# Patient Record
Sex: Male | Born: 1945 | Race: White | Hispanic: No | Marital: Married | Smoking: Former smoker
Health system: Southern US, Community
[De-identification: ages and names within clinical notes are randomized; demographics above are authoritative.]

## PROBLEM LIST (undated history)

## (undated) DIAGNOSIS — D638 Anemia in other chronic diseases classified elsewhere: Secondary | ICD-10-CM

## (undated) DIAGNOSIS — L4050 Arthropathic psoriasis, unspecified: Secondary | ICD-10-CM

## (undated) DIAGNOSIS — J449 Chronic obstructive pulmonary disease, unspecified: Secondary | ICD-10-CM

## (undated) DIAGNOSIS — A4101 Sepsis due to Methicillin susceptible Staphylococcus aureus: Secondary | ICD-10-CM

## (undated) DIAGNOSIS — L97509 Non-pressure chronic ulcer of other part of unspecified foot with unspecified severity: Secondary | ICD-10-CM

## (undated) DIAGNOSIS — E11621 Type 2 diabetes mellitus with foot ulcer: Secondary | ICD-10-CM

## (undated) DIAGNOSIS — E039 Hypothyroidism, unspecified: Secondary | ICD-10-CM

## (undated) DIAGNOSIS — I10 Essential (primary) hypertension: Secondary | ICD-10-CM

## (undated) DIAGNOSIS — D649 Anemia, unspecified: Secondary | ICD-10-CM

## (undated) DIAGNOSIS — J4489 Other specified chronic obstructive pulmonary disease: Secondary | ICD-10-CM

## (undated) DIAGNOSIS — G4730 Sleep apnea, unspecified: Secondary | ICD-10-CM

## (undated) DIAGNOSIS — B459 Cryptococcosis, unspecified: Secondary | ICD-10-CM

## (undated) DIAGNOSIS — M1990 Unspecified osteoarthritis, unspecified site: Secondary | ICD-10-CM

## (undated) DIAGNOSIS — A4902 Methicillin resistant Staphylococcus aureus infection, unspecified site: Secondary | ICD-10-CM

## (undated) DIAGNOSIS — N200 Calculus of kidney: Secondary | ICD-10-CM

## (undated) DIAGNOSIS — B029 Zoster without complications: Secondary | ICD-10-CM

## (undated) DIAGNOSIS — M545 Low back pain, unspecified: Secondary | ICD-10-CM

## (undated) DIAGNOSIS — E119 Type 2 diabetes mellitus without complications: Secondary | ICD-10-CM

## (undated) DIAGNOSIS — T879 Unspecified complications of amputation stump: Principal | ICD-10-CM

## (undated) DIAGNOSIS — K219 Gastro-esophageal reflux disease without esophagitis: Secondary | ICD-10-CM

## (undated) HISTORY — DX: Anemia, unspecified: D64.9

## (undated) HISTORY — DX: Unspecified osteoarthritis, unspecified site: M19.90

## (undated) HISTORY — DX: Hypothyroidism, unspecified: E03.9

## (undated) HISTORY — PX: TOE AMPUTATION: SHX809

## (undated) HISTORY — DX: Essential (primary) hypertension: I10

## (undated) HISTORY — DX: Non-pressure chronic ulcer of other part of unspecified foot with unspecified severity: L97.509

## (undated) HISTORY — DX: Arthropathic psoriasis, unspecified: L40.50

## (undated) HISTORY — DX: Anemia in other chronic diseases classified elsewhere: D63.8

## (undated) HISTORY — DX: Chronic obstructive pulmonary disease, unspecified: J44.9

## (undated) HISTORY — PX: BACK SURGERY: SHX140

## (undated) HISTORY — DX: Sepsis due to methicillin susceptible Staphylococcus aureus: A41.01

## (undated) HISTORY — DX: Cryptococcosis, unspecified: B45.9

## (undated) HISTORY — PX: TOTAL HIP ARTHROPLASTY: SHX124

## (undated) HISTORY — PX: THORACIC SPINE SURGERY: SHX802

## (undated) HISTORY — DX: Type 2 diabetes mellitus with foot ulcer: E11.621

## (undated) HISTORY — DX: Other specified chronic obstructive pulmonary disease: J44.89

## (undated) HISTORY — DX: Calculus of kidney: N20.0

## (undated) HISTORY — DX: Low back pain: M54.5

## (undated) HISTORY — DX: Gastro-esophageal reflux disease without esophagitis: K21.9

## (undated) HISTORY — DX: Low back pain, unspecified: M54.50

## (undated) HISTORY — DX: Unspecified complications of amputation stump: T87.9

## (undated) HISTORY — DX: Zoster without complications: B02.9

---

## 1997-10-11 ENCOUNTER — Ambulatory Visit (HOSPITAL_COMMUNITY): Admission: RE | Admit: 1997-10-11 | Discharge: 1997-10-11 | Payer: Self-pay

## 1998-07-24 ENCOUNTER — Ambulatory Visit (HOSPITAL_COMMUNITY): Admission: RE | Admit: 1998-07-24 | Discharge: 1998-07-24 | Payer: Self-pay | Admitting: *Deleted

## 2000-02-20 ENCOUNTER — Encounter: Admission: RE | Admit: 2000-02-20 | Discharge: 2000-02-20 | Payer: Self-pay

## 2000-05-02 ENCOUNTER — Encounter: Payer: Self-pay | Admitting: Neurosurgery

## 2000-05-02 ENCOUNTER — Ambulatory Visit (HOSPITAL_COMMUNITY): Admission: RE | Admit: 2000-05-02 | Discharge: 2000-05-02 | Payer: Self-pay | Admitting: Neurosurgery

## 2000-05-16 ENCOUNTER — Ambulatory Visit (HOSPITAL_COMMUNITY): Admission: RE | Admit: 2000-05-16 | Discharge: 2000-05-16 | Payer: Self-pay | Admitting: Neurosurgery

## 2000-05-16 ENCOUNTER — Encounter: Payer: Self-pay | Admitting: Neurosurgery

## 2000-05-30 ENCOUNTER — Encounter: Payer: Self-pay | Admitting: Neurosurgery

## 2000-05-30 ENCOUNTER — Ambulatory Visit (HOSPITAL_COMMUNITY): Admission: RE | Admit: 2000-05-30 | Discharge: 2000-05-30 | Payer: Self-pay | Admitting: Neurosurgery

## 2000-06-12 ENCOUNTER — Encounter: Admission: RE | Admit: 2000-06-12 | Discharge: 2000-06-12 | Payer: Self-pay | Admitting: Neurosurgery

## 2000-06-12 ENCOUNTER — Encounter: Payer: Self-pay | Admitting: Neurosurgery

## 2001-01-19 ENCOUNTER — Ambulatory Visit (HOSPITAL_COMMUNITY): Admission: RE | Admit: 2001-01-19 | Discharge: 2001-01-19 | Payer: Self-pay | Admitting: *Deleted

## 2001-08-21 ENCOUNTER — Encounter: Payer: Self-pay | Admitting: Neurosurgery

## 2001-08-25 ENCOUNTER — Inpatient Hospital Stay (HOSPITAL_COMMUNITY): Admission: RE | Admit: 2001-08-25 | Discharge: 2001-09-01 | Payer: Self-pay | Admitting: Neurosurgery

## 2001-08-25 ENCOUNTER — Encounter: Payer: Self-pay | Admitting: Neurosurgery

## 2003-06-02 ENCOUNTER — Encounter: Admission: RE | Admit: 2003-06-02 | Discharge: 2003-06-02 | Payer: Self-pay | Admitting: Neurosurgery

## 2003-11-01 ENCOUNTER — Inpatient Hospital Stay (HOSPITAL_COMMUNITY): Admission: RE | Admit: 2003-11-01 | Discharge: 2003-11-09 | Payer: Self-pay | Admitting: Neurosurgery

## 2003-12-15 ENCOUNTER — Other Ambulatory Visit: Admission: RE | Admit: 2003-12-15 | Discharge: 2003-12-15 | Payer: Self-pay | Admitting: Dermatology

## 2005-01-29 ENCOUNTER — Encounter: Admission: RE | Admit: 2005-01-29 | Discharge: 2005-01-29 | Payer: Self-pay

## 2005-02-26 ENCOUNTER — Encounter: Admission: RE | Admit: 2005-02-26 | Discharge: 2005-02-26 | Payer: Self-pay | Admitting: Neurosurgery

## 2005-05-28 ENCOUNTER — Encounter: Admission: RE | Admit: 2005-05-28 | Discharge: 2005-05-28 | Payer: Self-pay | Admitting: Otolaryngology

## 2005-11-19 ENCOUNTER — Ambulatory Visit: Payer: Self-pay | Admitting: Internal Medicine

## 2006-01-13 ENCOUNTER — Ambulatory Visit: Payer: Self-pay | Admitting: Internal Medicine

## 2006-01-18 ENCOUNTER — Ambulatory Visit (HOSPITAL_BASED_OUTPATIENT_CLINIC_OR_DEPARTMENT_OTHER): Admission: RE | Admit: 2006-01-18 | Discharge: 2006-01-18 | Payer: Self-pay | Admitting: Internal Medicine

## 2006-01-19 ENCOUNTER — Ambulatory Visit: Payer: Self-pay | Admitting: Internal Medicine

## 2006-02-03 ENCOUNTER — Ambulatory Visit: Payer: Self-pay | Admitting: Internal Medicine

## 2006-03-24 ENCOUNTER — Ambulatory Visit: Payer: Self-pay | Admitting: Internal Medicine

## 2006-03-27 ENCOUNTER — Ambulatory Visit: Payer: Self-pay | Admitting: Internal Medicine

## 2006-04-08 DIAGNOSIS — B459 Cryptococcosis, unspecified: Secondary | ICD-10-CM

## 2006-04-08 HISTORY — DX: Cryptococcosis, unspecified: B45.9

## 2006-05-30 ENCOUNTER — Ambulatory Visit (HOSPITAL_BASED_OUTPATIENT_CLINIC_OR_DEPARTMENT_OTHER): Admission: RE | Admit: 2006-05-30 | Discharge: 2006-05-30 | Payer: Self-pay | Admitting: Orthopedic Surgery

## 2006-05-30 ENCOUNTER — Encounter (INDEPENDENT_AMBULATORY_CARE_PROVIDER_SITE_OTHER): Payer: Self-pay | Admitting: Specialist

## 2006-05-30 ENCOUNTER — Encounter: Payer: Self-pay | Admitting: Internal Medicine

## 2006-06-09 ENCOUNTER — Ambulatory Visit: Payer: Self-pay | Admitting: Infectious Diseases

## 2006-07-22 ENCOUNTER — Ambulatory Visit: Payer: Self-pay | Admitting: Internal Medicine

## 2006-09-05 ENCOUNTER — Ambulatory Visit: Payer: Self-pay | Admitting: Cardiology

## 2006-09-19 ENCOUNTER — Ambulatory Visit: Payer: Self-pay

## 2006-09-19 ENCOUNTER — Ambulatory Visit: Payer: Self-pay | Admitting: Cardiology

## 2006-12-10 ENCOUNTER — Ambulatory Visit: Payer: Self-pay | Admitting: Cardiology

## 2006-12-15 ENCOUNTER — Ambulatory Visit: Payer: Self-pay | Admitting: Internal Medicine

## 2006-12-15 DIAGNOSIS — Z87442 Personal history of urinary calculi: Secondary | ICD-10-CM | POA: Insufficient documentation

## 2006-12-15 DIAGNOSIS — I10 Essential (primary) hypertension: Secondary | ICD-10-CM

## 2006-12-15 DIAGNOSIS — IMO0001 Reserved for inherently not codable concepts without codable children: Secondary | ICD-10-CM | POA: Insufficient documentation

## 2006-12-15 DIAGNOSIS — M545 Low back pain: Secondary | ICD-10-CM | POA: Insufficient documentation

## 2006-12-15 DIAGNOSIS — G4733 Obstructive sleep apnea (adult) (pediatric): Secondary | ICD-10-CM

## 2006-12-15 DIAGNOSIS — E039 Hypothyroidism, unspecified: Secondary | ICD-10-CM

## 2006-12-15 DIAGNOSIS — R609 Edema, unspecified: Secondary | ICD-10-CM

## 2006-12-15 DIAGNOSIS — L4050 Arthropathic psoriasis, unspecified: Secondary | ICD-10-CM

## 2006-12-15 DIAGNOSIS — B459 Cryptococcosis, unspecified: Secondary | ICD-10-CM

## 2006-12-15 DIAGNOSIS — M1990 Unspecified osteoarthritis, unspecified site: Secondary | ICD-10-CM | POA: Insufficient documentation

## 2006-12-15 DIAGNOSIS — Z87891 Personal history of nicotine dependence: Secondary | ICD-10-CM

## 2006-12-15 DIAGNOSIS — K219 Gastro-esophageal reflux disease without esophagitis: Secondary | ICD-10-CM | POA: Insufficient documentation

## 2006-12-18 ENCOUNTER — Ambulatory Visit: Payer: Self-pay | Admitting: Cardiology

## 2006-12-18 LAB — CONVERTED CEMR LAB
BUN: 32 mg/dL — ABNORMAL HIGH (ref 6–23)
CO2: 33 meq/L — ABNORMAL HIGH (ref 19–32)
Chloride: 102 meq/L (ref 96–112)
Creatinine, Ser: 1.5 mg/dL (ref 0.4–1.5)
Potassium: 5.6 meq/L — ABNORMAL HIGH (ref 3.5–5.1)

## 2006-12-25 ENCOUNTER — Ambulatory Visit: Payer: Self-pay | Admitting: Cardiology

## 2006-12-25 LAB — CONVERTED CEMR LAB
BUN: 20 mg/dL (ref 6–23)
CO2: 33 meq/L — ABNORMAL HIGH (ref 19–32)
Calcium: 9.4 mg/dL (ref 8.4–10.5)
Chloride: 104 meq/L (ref 96–112)
Creatinine, Ser: 0.8 mg/dL (ref 0.4–1.5)
GFR calc Af Amer: 126 mL/min
Glucose, Bld: 100 mg/dL — ABNORMAL HIGH (ref 70–99)

## 2007-01-13 ENCOUNTER — Ambulatory Visit: Payer: Self-pay | Admitting: Internal Medicine

## 2007-01-13 ENCOUNTER — Ambulatory Visit: Payer: Self-pay | Admitting: Cardiology

## 2007-01-28 ENCOUNTER — Ambulatory Visit: Payer: Self-pay | Admitting: Internal Medicine

## 2007-02-20 ENCOUNTER — Telehealth: Payer: Self-pay | Admitting: Internal Medicine

## 2007-03-12 ENCOUNTER — Encounter (INDEPENDENT_AMBULATORY_CARE_PROVIDER_SITE_OTHER): Payer: Self-pay | Admitting: *Deleted

## 2007-03-17 ENCOUNTER — Ambulatory Visit: Payer: Self-pay | Admitting: Internal Medicine

## 2007-04-08 ENCOUNTER — Encounter (INDEPENDENT_AMBULATORY_CARE_PROVIDER_SITE_OTHER): Payer: Self-pay | Admitting: Orthopedic Surgery

## 2007-04-08 ENCOUNTER — Ambulatory Visit (HOSPITAL_BASED_OUTPATIENT_CLINIC_OR_DEPARTMENT_OTHER): Admission: RE | Admit: 2007-04-08 | Discharge: 2007-04-08 | Payer: Self-pay | Admitting: Orthopedic Surgery

## 2007-04-09 HISTORY — PX: TOE SURGERY: SHX1073

## 2007-07-08 ENCOUNTER — Ambulatory Visit: Payer: Self-pay | Admitting: Internal Medicine

## 2007-07-08 LAB — CONVERTED CEMR LAB: Crypto Ag: NEGATIVE

## 2007-07-09 ENCOUNTER — Ambulatory Visit: Payer: Self-pay | Admitting: Cardiology

## 2007-07-16 ENCOUNTER — Encounter: Admission: RE | Admit: 2007-07-16 | Discharge: 2007-07-16 | Payer: Self-pay | Admitting: Orthopaedic Surgery

## 2007-07-21 ENCOUNTER — Ambulatory Visit: Payer: Self-pay | Admitting: Internal Medicine

## 2007-07-31 ENCOUNTER — Encounter: Admission: RE | Admit: 2007-07-31 | Discharge: 2007-07-31 | Payer: Self-pay | Admitting: Orthopaedic Surgery

## 2007-08-02 DIAGNOSIS — J309 Allergic rhinitis, unspecified: Secondary | ICD-10-CM | POA: Insufficient documentation

## 2007-09-01 ENCOUNTER — Emergency Department (HOSPITAL_COMMUNITY): Admission: EM | Admit: 2007-09-01 | Discharge: 2007-09-01 | Payer: Self-pay | Admitting: Emergency Medicine

## 2007-09-04 ENCOUNTER — Encounter: Admission: RE | Admit: 2007-09-04 | Discharge: 2007-09-04 | Payer: Self-pay | Admitting: Neurosurgery

## 2007-09-07 ENCOUNTER — Ambulatory Visit (HOSPITAL_COMMUNITY): Admission: RE | Admit: 2007-09-07 | Discharge: 2007-09-07 | Payer: Self-pay | Admitting: Orthopedic Surgery

## 2007-09-08 ENCOUNTER — Encounter (INDEPENDENT_AMBULATORY_CARE_PROVIDER_SITE_OTHER): Payer: Self-pay | Admitting: Orthopedic Surgery

## 2007-09-08 ENCOUNTER — Ambulatory Visit (HOSPITAL_BASED_OUTPATIENT_CLINIC_OR_DEPARTMENT_OTHER): Admission: RE | Admit: 2007-09-08 | Discharge: 2007-09-08 | Payer: Self-pay | Admitting: Orthopedic Surgery

## 2007-09-10 ENCOUNTER — Encounter: Admission: RE | Admit: 2007-09-10 | Discharge: 2007-09-10 | Payer: Self-pay | Admitting: Orthopaedic Surgery

## 2007-10-26 ENCOUNTER — Ambulatory Visit: Payer: Self-pay | Admitting: Critical Care Medicine

## 2007-10-26 ENCOUNTER — Ambulatory Visit: Payer: Self-pay | Admitting: Cardiovascular Disease

## 2007-10-26 ENCOUNTER — Encounter (INDEPENDENT_AMBULATORY_CARE_PROVIDER_SITE_OTHER): Payer: Self-pay | Admitting: Internal Medicine

## 2007-10-26 ENCOUNTER — Ambulatory Visit: Payer: Self-pay | Admitting: Surgery

## 2007-10-26 ENCOUNTER — Inpatient Hospital Stay (HOSPITAL_COMMUNITY): Admission: EM | Admit: 2007-10-26 | Discharge: 2007-11-09 | Payer: Self-pay | Admitting: Emergency Medicine

## 2007-10-26 ENCOUNTER — Ambulatory Visit: Payer: Self-pay | Admitting: Oncology

## 2007-10-27 ENCOUNTER — Ambulatory Visit: Payer: Self-pay | Admitting: Infectious Diseases

## 2007-10-28 ENCOUNTER — Encounter: Payer: Self-pay | Admitting: Cardiovascular Disease

## 2007-11-03 ENCOUNTER — Ambulatory Visit: Payer: Self-pay | Admitting: Internal Medicine

## 2007-11-05 ENCOUNTER — Ambulatory Visit: Payer: Self-pay | Admitting: Physical Medicine & Rehabilitation

## 2007-11-09 ENCOUNTER — Inpatient Hospital Stay (HOSPITAL_COMMUNITY)
Admission: RE | Admit: 2007-11-09 | Discharge: 2007-11-18 | Payer: Self-pay | Admitting: Physical Medicine & Rehabilitation

## 2007-11-09 ENCOUNTER — Ambulatory Visit: Payer: Self-pay | Admitting: Physical Medicine & Rehabilitation

## 2007-11-24 ENCOUNTER — Telehealth (INDEPENDENT_AMBULATORY_CARE_PROVIDER_SITE_OTHER): Payer: Self-pay | Admitting: *Deleted

## 2007-11-24 ENCOUNTER — Encounter: Payer: Self-pay | Admitting: Critical Care Medicine

## 2007-12-17 ENCOUNTER — Encounter
Admission: RE | Admit: 2007-12-17 | Discharge: 2007-12-18 | Payer: Self-pay | Admitting: Physical Medicine & Rehabilitation

## 2007-12-18 ENCOUNTER — Ambulatory Visit: Payer: Self-pay | Admitting: Physical Medicine & Rehabilitation

## 2007-12-24 ENCOUNTER — Ambulatory Visit: Payer: Self-pay | Admitting: Cardiology

## 2007-12-24 LAB — CONVERTED CEMR LAB
BUN: 13 mg/dL (ref 6–23)
CO2: 28 meq/L (ref 19–32)
Chloride: 103 meq/L (ref 96–112)
GFR calc Af Amer: 125 mL/min
Glucose, Bld: 143 mg/dL — ABNORMAL HIGH (ref 70–99)
Potassium: 5 meq/L (ref 3.5–5.1)
Sodium: 140 meq/L (ref 135–145)

## 2008-01-13 ENCOUNTER — Ambulatory Visit: Payer: Self-pay | Admitting: Internal Medicine

## 2008-01-13 LAB — CONVERTED CEMR LAB: Crypto Ag: NEGATIVE

## 2008-01-14 ENCOUNTER — Encounter: Admission: RE | Admit: 2008-01-14 | Discharge: 2008-01-14 | Payer: Self-pay | Admitting: Orthopaedic Surgery

## 2008-01-15 ENCOUNTER — Telehealth: Payer: Self-pay | Admitting: Internal Medicine

## 2008-01-20 ENCOUNTER — Encounter: Payer: Self-pay | Admitting: Internal Medicine

## 2008-01-21 ENCOUNTER — Ambulatory Visit: Payer: Self-pay | Admitting: Internal Medicine

## 2008-02-16 ENCOUNTER — Encounter (INDEPENDENT_AMBULATORY_CARE_PROVIDER_SITE_OTHER): Payer: Self-pay | Admitting: Orthopaedic Surgery

## 2008-02-16 ENCOUNTER — Inpatient Hospital Stay (HOSPITAL_COMMUNITY): Admission: RE | Admit: 2008-02-16 | Discharge: 2008-02-20 | Payer: Self-pay | Admitting: Orthopaedic Surgery

## 2008-03-09 ENCOUNTER — Ambulatory Visit: Payer: Self-pay | Admitting: Cardiology

## 2008-03-09 LAB — CONVERTED CEMR LAB
Basophils Relative: 1 % (ref 0.0–3.0)
Eosinophils Relative: 0 % (ref 0.0–5.0)
Hemoglobin: 13.1 g/dL (ref 13.0–17.0)
MCHC: 34.7 g/dL (ref 30.0–36.0)
MCV: 88.2 fL (ref 78.0–100.0)
Neutrophils Relative %: 74 % (ref 43.0–77.0)
RBC: 4.27 M/uL (ref 4.22–5.81)
WBC: 7.5 10*3/uL (ref 4.5–10.5)

## 2008-05-25 ENCOUNTER — Encounter: Admission: RE | Admit: 2008-05-25 | Discharge: 2008-05-25 | Payer: Self-pay | Admitting: Neurosurgery

## 2008-06-22 ENCOUNTER — Ambulatory Visit: Payer: Self-pay | Admitting: Cardiology

## 2008-07-19 ENCOUNTER — Ambulatory Visit: Payer: Self-pay | Admitting: Internal Medicine

## 2008-09-20 ENCOUNTER — Ambulatory Visit: Payer: Self-pay | Admitting: Cardiology

## 2008-10-03 ENCOUNTER — Ambulatory Visit: Payer: Self-pay

## 2008-10-03 ENCOUNTER — Encounter: Payer: Self-pay | Admitting: Cardiology

## 2009-01-12 ENCOUNTER — Encounter: Payer: Self-pay | Admitting: Cardiology

## 2009-01-31 ENCOUNTER — Inpatient Hospital Stay (HOSPITAL_COMMUNITY): Admission: RE | Admit: 2009-01-31 | Discharge: 2009-02-04 | Payer: Self-pay | Admitting: Neurosurgery

## 2009-04-14 ENCOUNTER — Ambulatory Visit: Payer: Self-pay | Admitting: Internal Medicine

## 2009-04-17 ENCOUNTER — Encounter (INDEPENDENT_AMBULATORY_CARE_PROVIDER_SITE_OTHER): Payer: Self-pay

## 2009-04-17 ENCOUNTER — Ambulatory Visit: Payer: Self-pay | Admitting: Cardiology

## 2009-04-17 LAB — CONVERTED CEMR LAB
Albumin: 4.1 g/dL
BUN: 22 mg/dL
CO2: 28 meq/L
Glucose, Bld: 142 mg/dL
HCT: 40.4 %
Hemoglobin: 12.8 g/dL
Potassium: 5.2 meq/L
Sodium: 142 meq/L
TSH: 0.26 microintl units/mL
Total Protein: 6.9 g/dL

## 2009-05-18 ENCOUNTER — Ambulatory Visit: Payer: Self-pay | Admitting: Cardiology

## 2009-05-19 ENCOUNTER — Encounter: Payer: Self-pay | Admitting: Cardiology

## 2009-06-14 DIAGNOSIS — R509 Fever, unspecified: Secondary | ICD-10-CM

## 2009-06-21 ENCOUNTER — Encounter: Admission: RE | Admit: 2009-06-21 | Discharge: 2009-06-21 | Payer: Self-pay | Admitting: Surgery

## 2009-06-21 ENCOUNTER — Encounter: Payer: Self-pay | Admitting: Internal Medicine

## 2009-06-21 ENCOUNTER — Encounter: Admission: RE | Admit: 2009-06-21 | Discharge: 2009-06-21 | Payer: Self-pay | Admitting: Neurosurgery

## 2009-06-30 ENCOUNTER — Encounter (INDEPENDENT_AMBULATORY_CARE_PROVIDER_SITE_OTHER): Payer: Self-pay | Admitting: *Deleted

## 2009-06-30 DIAGNOSIS — E785 Hyperlipidemia, unspecified: Secondary | ICD-10-CM | POA: Insufficient documentation

## 2009-06-30 DIAGNOSIS — Z96649 Presence of unspecified artificial hip joint: Secondary | ICD-10-CM | POA: Insufficient documentation

## 2009-06-30 DIAGNOSIS — Z9889 Other specified postprocedural states: Secondary | ICD-10-CM | POA: Insufficient documentation

## 2009-06-30 DIAGNOSIS — IMO0002 Reserved for concepts with insufficient information to code with codable children: Secondary | ICD-10-CM | POA: Insufficient documentation

## 2009-07-14 ENCOUNTER — Ambulatory Visit: Payer: Self-pay | Admitting: Infectious Diseases

## 2009-07-14 LAB — CONVERTED CEMR LAB
ALT: 15 units/L (ref 0–53)
AST: 16 units/L (ref 0–37)
Albumin: 4.1 g/dL (ref 3.5–5.2)
Alkaline Phosphatase: 103 units/L (ref 39–117)
Calcium: 9 mg/dL (ref 8.4–10.5)
Chloride: 102 meq/L (ref 96–112)
Potassium: 5.2 meq/L (ref 3.5–5.3)
Sodium: 142 meq/L (ref 135–145)
Total Protein: 6.1 g/dL (ref 6.0–8.3)

## 2009-07-18 ENCOUNTER — Ambulatory Visit: Payer: Self-pay | Admitting: Internal Medicine

## 2009-07-18 DIAGNOSIS — J984 Other disorders of lung: Secondary | ICD-10-CM | POA: Insufficient documentation

## 2009-07-18 DIAGNOSIS — R1900 Intra-abdominal and pelvic swelling, mass and lump, unspecified site: Secondary | ICD-10-CM

## 2009-07-26 ENCOUNTER — Ambulatory Visit (HOSPITAL_COMMUNITY): Admission: RE | Admit: 2009-07-26 | Discharge: 2009-07-26 | Payer: Self-pay | Admitting: Internal Medicine

## 2009-08-09 ENCOUNTER — Ambulatory Visit: Payer: Self-pay | Admitting: Infectious Diseases

## 2009-08-24 ENCOUNTER — Ambulatory Visit: Payer: Self-pay | Admitting: Internal Medicine

## 2009-08-28 ENCOUNTER — Emergency Department (HOSPITAL_COMMUNITY): Admission: EM | Admit: 2009-08-28 | Discharge: 2009-08-28 | Payer: Self-pay | Admitting: Emergency Medicine

## 2009-09-03 ENCOUNTER — Inpatient Hospital Stay (HOSPITAL_COMMUNITY): Admission: EM | Admit: 2009-09-03 | Discharge: 2009-09-09 | Payer: Self-pay | Admitting: Emergency Medicine

## 2009-09-05 ENCOUNTER — Ambulatory Visit: Payer: Self-pay | Admitting: Vascular Surgery

## 2009-09-05 ENCOUNTER — Encounter (INDEPENDENT_AMBULATORY_CARE_PROVIDER_SITE_OTHER): Payer: Self-pay | Admitting: Internal Medicine

## 2009-09-06 ENCOUNTER — Encounter (INDEPENDENT_AMBULATORY_CARE_PROVIDER_SITE_OTHER): Payer: Self-pay | Admitting: Internal Medicine

## 2009-09-06 DIAGNOSIS — S98139A Complete traumatic amputation of one unspecified lesser toe, initial encounter: Secondary | ICD-10-CM | POA: Insufficient documentation

## 2009-09-07 ENCOUNTER — Ambulatory Visit: Payer: Self-pay | Admitting: Infectious Diseases

## 2009-09-11 ENCOUNTER — Encounter: Payer: Self-pay | Admitting: Infectious Diseases

## 2009-09-15 ENCOUNTER — Encounter: Payer: Self-pay | Admitting: Infectious Diseases

## 2009-09-18 ENCOUNTER — Telehealth: Payer: Self-pay | Admitting: Infectious Diseases

## 2009-09-19 ENCOUNTER — Ambulatory Visit: Payer: Self-pay | Admitting: Oncology

## 2009-09-22 ENCOUNTER — Encounter: Payer: Self-pay | Admitting: Infectious Diseases

## 2009-09-26 ENCOUNTER — Encounter: Payer: Self-pay | Admitting: Infectious Diseases

## 2009-09-27 ENCOUNTER — Encounter (INDEPENDENT_AMBULATORY_CARE_PROVIDER_SITE_OTHER): Payer: Self-pay | Admitting: *Deleted

## 2009-09-29 ENCOUNTER — Telehealth: Payer: Self-pay | Admitting: Cardiology

## 2009-10-02 ENCOUNTER — Encounter: Payer: Self-pay | Admitting: Infectious Diseases

## 2009-10-03 ENCOUNTER — Telehealth: Payer: Self-pay | Admitting: Infectious Diseases

## 2009-10-04 ENCOUNTER — Ambulatory Visit: Payer: Self-pay | Admitting: Infectious Diseases

## 2009-10-18 LAB — CBC & DIFF AND RETIC
Basophils Absolute: 0 10*3/uL (ref 0.0–0.1)
EOS%: 1.3 % (ref 0.0–7.0)
Eosinophils Absolute: 0.1 10*3/uL (ref 0.0–0.5)
HGB: 8.3 g/dL — ABNORMAL LOW (ref 13.0–17.1)
LYMPH%: 11.2 % — ABNORMAL LOW (ref 14.0–49.0)
MCH: 30.3 pg (ref 27.2–33.4)
MCV: 100.7 fL — ABNORMAL HIGH (ref 79.3–98.0)
MONO%: 0.4 % (ref 0.0–14.0)
NEUT#: 4.6 10*3/uL (ref 1.5–6.5)
NEUT%: 86.9 % — ABNORMAL HIGH (ref 39.0–75.0)
Platelets: 293 10*3/uL (ref 140–400)
RDW: 18.9 % — ABNORMAL HIGH (ref 11.0–14.6)

## 2009-10-18 LAB — COMPREHENSIVE METABOLIC PANEL
Albumin: 2.9 g/dL — ABNORMAL LOW (ref 3.5–5.2)
Alkaline Phosphatase: 176 U/L — ABNORMAL HIGH (ref 39–117)
BUN: 14 mg/dL (ref 6–23)
CO2: 31 mEq/L (ref 19–32)
Calcium: 8.9 mg/dL (ref 8.4–10.5)
Chloride: 98 mEq/L (ref 96–112)
Glucose, Bld: 125 mg/dL — ABNORMAL HIGH (ref 70–99)
Potassium: 4 mEq/L (ref 3.5–5.3)
Sodium: 139 mEq/L (ref 135–145)
Total Protein: 6 g/dL (ref 6.0–8.3)

## 2009-10-18 LAB — URIC ACID: Uric Acid, Serum: 3.7 mg/dL — ABNORMAL LOW (ref 4.0–7.8)

## 2009-10-18 LAB — MORPHOLOGY: PLT EST: ADEQUATE

## 2009-10-20 ENCOUNTER — Encounter: Payer: Self-pay | Admitting: Infectious Diseases

## 2009-10-20 LAB — IMMUNOFIXATION ELECTROPHORESIS
IgG (Immunoglobin G), Serum: 448 mg/dL — ABNORMAL LOW (ref 694–1618)
IgM, Serum: 60 mg/dL (ref 60–263)

## 2009-10-20 LAB — ANA: Anti Nuclear Antibody(ANA): NEGATIVE

## 2009-10-20 LAB — IRON AND TIBC
%SAT: 18 % — ABNORMAL LOW (ref 20–55)
Iron: 39 ug/dL — ABNORMAL LOW (ref 42–165)
TIBC: 216 ug/dL (ref 215–435)

## 2009-10-23 ENCOUNTER — Ambulatory Visit: Payer: Self-pay | Admitting: Oncology

## 2009-10-25 ENCOUNTER — Encounter: Payer: Self-pay | Admitting: Internal Medicine

## 2009-10-25 LAB — FOLATE: Folate: >20 ng/mL

## 2009-11-02 ENCOUNTER — Ambulatory Visit: Payer: Self-pay | Admitting: Cardiology

## 2009-11-03 LAB — CREATININE CLEARANCE, URINE, 24 HOUR
Collection Interval-CRCL: 24 hours
Creatinine, 24H Ur: 1045 mg/d (ref 800–2000)
Creatinine, Urine: 42.2 mg/dL
Creatinine: 0.7 mg/dL (ref 0.40–1.50)

## 2009-11-03 LAB — UIFE/LIGHT CHAINS/TP QN, 24-HR UR
Albumin, U: DETECTED
Alpha 1, Urine: DETECTED — AB
Time: 24 hours
Total Protein, Urine: 0.8 mg/dL

## 2009-11-07 ENCOUNTER — Inpatient Hospital Stay (HOSPITAL_COMMUNITY): Admission: RE | Admit: 2009-11-07 | Discharge: 2009-11-17 | Payer: Self-pay | Admitting: Neurosurgery

## 2009-12-22 ENCOUNTER — Ambulatory Visit: Payer: Self-pay | Admitting: Oncology

## 2009-12-26 ENCOUNTER — Encounter: Payer: Self-pay | Admitting: Internal Medicine

## 2009-12-26 LAB — CBC & DIFF AND RETIC
Basophils Absolute: 0 10*3/uL (ref 0.0–0.1)
Eosinophils Absolute: 0.3 10*3/uL (ref 0.0–0.5)
HGB: 8.3 g/dL — ABNORMAL LOW (ref 13.0–17.1)
LYMPH%: 5.2 % — ABNORMAL LOW (ref 14.0–49.0)
MCV: 96.4 fL (ref 79.3–98.0)
MONO#: 0.4 10*3/uL (ref 0.1–0.9)
MONO%: 4.3 % (ref 0.0–14.0)
NEUT#: 7.9 10*3/uL — ABNORMAL HIGH (ref 1.5–6.5)
Platelets: 326 10*3/uL (ref 140–400)
RDW: 20.2 % — ABNORMAL HIGH (ref 11.0–14.6)
Retic %: 2.34 % — ABNORMAL HIGH (ref 0.50–1.60)
Retic Ct Abs: 65.05 10*3/uL (ref 24.10–77.50)
WBC: 9 10*3/uL (ref 4.0–10.3)

## 2009-12-26 LAB — TECHNOLOGIST REVIEW

## 2010-01-11 ENCOUNTER — Other Ambulatory Visit: Admission: RE | Admit: 2010-01-11 | Discharge: 2010-01-11 | Payer: Self-pay | Admitting: Oncology

## 2010-01-11 ENCOUNTER — Ambulatory Visit: Payer: Self-pay | Admitting: Oncology

## 2010-01-11 LAB — CBC WITH DIFFERENTIAL/PLATELET
Basophils Absolute: 0 10*3/uL (ref 0.0–0.1)
Eosinophils Absolute: 0.2 10*3/uL (ref 0.0–0.5)
HGB: 8.1 g/dL — ABNORMAL LOW (ref 13.0–17.1)
NEUT#: 4.2 10*3/uL (ref 1.5–6.5)
RBC: 2.58 10*6/uL — ABNORMAL LOW (ref 4.20–5.82)
RDW: 23.4 % — ABNORMAL HIGH (ref 11.0–14.6)
lymph#: 0.5 10*3/uL — ABNORMAL LOW (ref 0.9–3.3)

## 2010-02-02 ENCOUNTER — Ambulatory Visit: Payer: Self-pay | Admitting: Oncology

## 2010-02-06 ENCOUNTER — Telehealth: Payer: Self-pay | Admitting: Internal Medicine

## 2010-02-06 ENCOUNTER — Encounter: Payer: Self-pay | Admitting: Internal Medicine

## 2010-02-20 ENCOUNTER — Ambulatory Visit: Payer: Self-pay | Admitting: Internal Medicine

## 2010-03-08 ENCOUNTER — Encounter: Payer: Self-pay | Admitting: Internal Medicine

## 2010-04-30 ENCOUNTER — Encounter: Payer: Self-pay | Admitting: Physical Medicine & Rehabilitation

## 2010-05-06 LAB — CONVERTED CEMR LAB
CO2: 34 meq/L — ABNORMAL HIGH (ref 19–32)
CO2: 35 meq/L — ABNORMAL HIGH (ref 19–32)
Calcium: 10.5 mg/dL (ref 8.4–10.5)
Chloride: 102 meq/L (ref 96–112)
Creatinine, Ser: 1 mg/dL (ref 0.4–1.5)
GFR calc Af Amer: 97 mL/min
Glucose, Bld: 105 mg/dL — ABNORMAL HIGH (ref 70–99)
Glucose, Bld: 95 mg/dL (ref 70–99)
Potassium: 5.1 meq/L (ref 3.5–5.1)
Sodium: 144 meq/L (ref 135–145)

## 2010-05-08 NOTE — Miscellaneous (Signed)
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

## 2010-05-08 NOTE — Assessment & Plan Note (Signed)
Summary: rov 1 month///kp   Primary Provider/Referring Provider:  Estill Bakes  CC:  1 month followup.  Pt c/o severe left side rib pain worsening since last seen.  He denies any difficulty with breathing.  Marland Kitchen  History of Present Illness:  07/19/08- allergic rhinitis,  COPD, OSA , Psoriatic arthritis Had left hip replaced, no problem. Will need to have bolts replaced in spine. No respiratory complications with anesthesia surgery. Tolerating spring pollen season withut cough or wheeze.  April 14, 2009- Allergic rhinitis, COPD, OSA..................................Marland Kitchenwife here Pain still after Dr Channing Mutters did lumbar and thoracic spine surgery in October- low right post flank. No rash. Has been coughing with chest congestion x past month. Grey/ yellow non blooody sputum. Says Fentanyl hasn't helped. Sleeps on recliner. Not dyspneic. Has had shingles twice. Remains compliant eith CPAP,  July 18, 2009- Allergic rhinitis, COPD, OSA, Hx Crypto......................................Marland Kitchenwife here Had taken Avelox in early March resulting in clearing of cough productive gray sputum. He was running fever then. Wife says that temp is down, but still some fluctuation. CT Abd 3/16- had shown nodular densities in posterior RLL. The CT was done by Dr [name] evaluating bulge left abdominal wall- possible hernia. Hx Crypto soft tissue infection. Recent Crypto antigen negative by Dr [name]. Wife says recent blood work at [facility] showed "inflamation in  the blood":  Aug 24, 2009- Allergic rhinitis, COPD, OSA, Hx Crypto...............................Marland Kitchenwife here Needle lung bx was cancelled when resoling LLL infiltrate indicated this had been infalmmatory. Saw Dr [name] who felt recurrent crypto unlikely but will continue to follow. He is limited by recurrent pain from his degenerative back surgery. Wife asks for a pain shot to let him rest. I reviewed my discussion with radiology that had favored radiculopathy  from his back to explain muscle laxity/ bulge left lateral chest, and pain. He denies dyspnea, ratle or congestion, productive cough, fever or sweat.  He continues to use CPAP 12 all night every night.  Current Medications (verified): 1)  Synthroid 150 Mcg  Tabs (Levothyroxine Sodium) .... One Daily 2)  Prednisone 5 Mg  Tabs (Prednisone) .... 2 Daily 3)  Arava 20 Mg  Tabs (Leflunomide) .... One Daily 4)  Endocet 10-650 Mg  Tabs (Oxycodone-Acetaminophen) .... One Every 8 Hours As Needed 5)  Cyclobenzaprine Hcl 10 Mg  Tabs (Cyclobenzaprine Hcl) .... One Every 8 Hours As Needed 6)  Calcium 600 600 Mg  Tabs (Calcium Carbonate) .... One Three Times A Day 7)  Vitamin D 400 Unit Caps (Cholecalciferol) .... Once Daily 8)  Temazepam 15 Mg  Caps (Temazepam) .... One or Two At Bedtime 9)  Furosemide 40 Mg  Tabs (Furosemide) .... One Once Daily 10)  Carvedilol 25 Mg Tabs (Carvedilol) .... Take 1 By Mouth Two Times A Day 11)  Cpap 12 12)  Symbicort 160-4.5 Mcg/act Aero (Budesonide-Formoterol Fumarate) .... I Puf Two Times A Day 13)  Vitamin C 500 Mg Chew (Ascorbic Acid) .... Take 1 By Mouth Once Daily 14)  Ferrous Sulfate 325 (65 Fe) Mg Tabs (Ferrous Sulfate) .... Once Daily 15)  Cozaar 100 Mg Tabs (Losartan Potassium) .... Take 1 Tablet By Mouth Once A Day 16)  Methotrexate Sodium 25 Mg/ml Soln (Methotrexate Sodium) .... 0.4cc Subcutaneously X1 Weekly 17)  Duragesic-100 100 Mcg/hr Pt72 (Fentanyl) .Marland Kitchen.. 1 Patch Every 3 Days 18)  Klor-Con M20 20 Meq Cr-Tabs (Potassium Chloride Crys Cr) .... Take 1 Tablet By Mouth Once A Day Per Pcp 19)  Klomgyza 5/500 Mg .Marland Kitchen.. 1 Once Daily  20)  Fish Oil 1000 Mg Caps (Omega-3 Fatty Acids)  Allergies (verified): 1)  ! Augmentin  Past History:  Past Medical History: Last updated: Aug 07, 2009 COPD GERD Psoriatic arthritis Hypertension Hypothyroidism- steroid maint. 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  His wife is a retired Engineer, civil (consulting) who can assist   at discharge.  The patient has had poor mobility secondary to his   worsening hip problems in the left.   Risk Factors: Alcohol Use: 0 (08/09/2009) Caffeine Use: 0 (08/09/2009) Exercise: yes (08/09/2009)  Risk Factors: Smoking Status: quit (08/09/2009)  Review of Systems      See HPI  The patient denies shortness of breath with activity, shortness of breath at rest, productive cough, non-productive cough, coughing up blood, irregular heartbeats, acid heartburn, indigestion, loss of appetite, weight change, abdominal pain, difficulty swallowing, sore throat, tooth/dental problems, headaches, nasal congestion/difficulty breathing through nose, and sneezing.    Vital Signs:  Patient profile:   65 year old male Weight:      181 pounds O2 Sat:      97 % on Room air Pulse rate:   86 / minute BP sitting:   144 / 92  (left arm)  Vitals Entered By: Vernie Murders (Aug 24, 2009 2:19 PM)  O2 Flow:  Room air  Physical Exam  Additional Exam:  General: A/Ox3; pleasant and cooperative, NAD, SKIN: no rash, lesions-  NODES: no lymphadenopathy HEENT: Lamar/AT, EOM- WNL, Conjuctivae- clear, PERRLA, TM-WNL, Nose- clear, Throat- clear and wnl NECK: Supple w/ fair ROM, JVD- none, normal carotid impulses w/o bruits  Thyroid- CHEST: Trace squeaks left back, fair airflow, unlabored, no dullness or rub. HEART: RRR, no m/g/r heard ABDOMEN: bulge left lateral chest wall/  EXB:MWUX, nl pulses,  1+ edema left ankle, , using a cane NEURO: Grossly intact to observation      CT of Chest  Procedure date:  07/26/2009  Findings:        Comparison: Abdominal CT dated 06/21/2009 and chest CT dated 01/29/2005   Findings: The images were obtained in a prone position.  There is no significant mediastinal, hilar or axillary lymphadenopathy. There is no significant pericardial or pleural effusion.  The patient has a moderate sized hiatal hernia.  Again seen are scattered low density areas throughout the liver that appears stable from the chest CT in 2006.  The low density structures most likely represent cysts.  The adrenal tissue is normal.  Visualized upper abdominal structures are within normal limits.  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 The trachea and mainstem bronchi are patent. The opacifications in the right lower lobe have nearly resolved.  There are residual linear and patchy densities in the right lower lobe.  Again seen is a 5 mm subpleural nodule along  the right major fissure.  This nodule has been present since 2006.  Stable calcification at the medial base of the right middle lobe.  There is also a stable calcified granuloma in the superior segment of the left lower lobe. The patient has multilevel pedicle screw and rod fixation in the thoracic spine.  There is marked disc space loss at T6-7.  Old fracture involving the right tenth.  Question an old injury to the inferior aspect of the right scapula.   IMPRESSION: Resolving opacifications in the right lower lobe.  Findings are consistent with a resolving infectious or inflammatory process.  As a result, the percutaneous biopsy was cancelled.  A follow-up CT in 3-6 months would be useful to ensure  complete resolution.   Stable subpleural nodule in the right major fissure.   Stable low density structures in the liver that likely represent cysts.   Read By:  Abundio Miu,  M.D.     Released By:  Abundio Miu,  M.D.   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 His CT was only 3 weeks ago so we will not image today.   Problem # 4:  OBSTRUCTIVE SLEEP APNEA (ICD-327.23) Good compliance and control with CPAP at 12.   Medications Added to Medication List This Visit: 1)  Klomgyza 5/500 Mg  .Marland Kitchen.. 1 once daily  Other Orders: Est. Patient Level III (29562)  Patient Instructions: 1)  Please schedule a follow-up appointment in 6 months. 2)  Come or call sooner as needed 3)  Ask Dr Channing Mutters about Pain Clnic referral/.   CT of Chest  Procedure date:  07/26/2009  Findings:        Comparison: Abdominal CT dated 06/21/2009 and chest CT dated 01/29/2005   Findings: The images were obtained in a prone position.  There is no significant mediastinal, hilar or axillary lymphadenopathy. There is no significant pericardial or pleural effusion.  The patient has a moderate sized hiatal hernia.  Again seen are scattered low density areas throughout the liver that appears stable from the chest CT in 2006.  The low density structures most likely represent cysts.  The adrenal tissue is normal.  Visualized upper abdominal structures are within normal limits.  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 The trachea and mainstem bronchi are patent. The opacifications in the right lower lobe have nearly resolved.  There are residual linear and patchy densities in the right lower  lobe.  Again seen is a 5 mm subpleural nodule along the right major fissure.  This nodule has been present since 2006.  Stable calcification at the medial base of the right middle lobe.  There is also a stable calcified granuloma in the superior segment of the left lower lobe. The patient has multilevel pedicle screw and rod fixation in the thoracic spine.  There is marked disc space loss at T6-7.  Old fracture involving the right tenth.  Question an old injury to the inferior aspect of the right scapula.   IMPRESSION: Resolving opacifications in the right lower lobe.  Findings are consistent with a resolving infectious or inflammatory process.  As a result, the percutaneous biopsy was cancelled.  A follow-up CT in 3-6 months  would be useful to ensure complete resolution.   Stable subpleural nodule in the right major fissure.   Stable low density structures in the liver that likely represent cysts.   Read By:  Abundio Miu,  M.D.     Released By:  Abundio Miu,  M.D.

## 2010-05-08 NOTE — Assessment & Plan Note (Signed)
Summary: f27m  Medications Added FOLIC ACID 1 MG TABS (FOLIC ACID) 1 tab once daily        Visit Type:  surg clearance Primary Provider:  Georgianne Fick  CC:  pt states he is having back surgery 11/07/09 with Dr. Trey Sailors....edema/legs says because he has been sitting alot...denies any cp or sob.  History of Present Illness: Mr. Skog comes in today for evaluation of his hypertension and edema.  He has no cardiac complaints. He's been very immobile lately in his lower extremity edema worsened. Primary care increased his Lasix to 80 mg q.a.m. This was done last week and his edema started improving.  He is going to have to undergo a fourth operation on his back. The rods had given way. He is set up for preop with Dr. Channing Mutters this week.  He denies any angina, orthopnea, PND. He has normal left ventricular systolic function. Most of his problems in the past been right-sided. He has COPD but quit smoking.  HAL side blood work is been reviewed which shows a hemoglobin of 8.9-9.1, stable electrolytes and renal function. He is diabetic.  Current Medications (verified): 1)  Synthroid 150 Mcg  Tabs (Levothyroxine Sodium) .... One Daily 2)  Prednisone 5 Mg  Tabs (Prednisone) .... Take 1 Tablet By Mouth Once A Day 3)  Arava 20 Mg  Tabs (Leflunomide) .... One Daily 4)  Endocet 10-650 Mg  Tabs (Oxycodone-Acetaminophen) .... One Every 8 Hours As Needed 5)  Cyclobenzaprine Hcl 10 Mg  Tabs (Cyclobenzaprine Hcl) .... One Every 8 Hours As Needed 6)  Calcium 600 600 Mg  Tabs (Calcium Carbonate) .... One Three Times A Day 7)  Vitamin D 400 Unit Caps (Cholecalciferol) .... Once Daily 8)  Temazepam 15 Mg  Caps (Temazepam) .... One or Two At Bedtime 9)  Furosemide 40 Mg  Tabs (Furosemide) .... One Once Daily 10)  Carvedilol 25 Mg Tabs (Carvedilol) .... Take 1 By Mouth Two Times A Day 11)  Cpap 12 12)  Symbicort 160-4.5 Mcg/act Aero (Budesonide-Formoterol Fumarate) .... I Puf Two Times A Day 13)  Vitamin C  500 Mg Chew (Ascorbic Acid) .... Take 1 By Mouth Once Daily 14)  Ferrous Sulfate 325 (65 Fe) Mg Tabs (Ferrous Sulfate) .... Once Daily 15)  Cozaar 100 Mg Tabs (Losartan Potassium) .... Take 1 Tablet By Mouth Once A Day 16)  Methotrexate Sodium 25 Mg/ml Soln (Methotrexate Sodium) .... 0.4cc Subcutaneously X1 Weekly 17)  Duragesic-100 100 Mcg/hr Pt72 (Fentanyl) .Marland Kitchen.. 1 Patch Every 3 Days 18)  Klor-Con M20 20 Meq Cr-Tabs (Potassium Chloride Crys Cr) .... Take 1 Tablet By Mouth Once A Day Per Pcp 19)  Klomgyza 5/500 Mg .Marland Kitchen.. 1 Once Daily 20)  Fish Oil 1000 Mg Caps (Omega-3 Fatty Acids) 21)  Neurontin 300 Mg Caps (Gabapentin) .... Take One Capsule By Mouth Every 8 Hours 22)  Dilaudid 2 Mg Tabs (Hydromorphone Hcl) .... Take One Tablet By Mouth Every 4 Hours 23)  Folic Acid 1 Mg Tabs (Folic Acid) .Marland Kitchen.. 1 Tab Once Daily  Allergies: 1)  ! Augmentin  Past History:  Past Medical History: Last updated: 10/04/2009 COPD GERD Psoriatic arthritis Hypertension Hypothyroidism- steroid maint. 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  His wife is a retired Engineer, civil (consulting) who can assist   at discharge.  The patient has had poor mobility secondary to his   worsening hip problems in the left.     Risk Factors: Alcohol Use: 0 (10/04/2009) Caffeine Use: 0 (10/04/2009) Exercise: no (10/04/2009)  Risk Factors: Smoking Status: quit (10/04/2009)  Review of Systems       negative other than history of present illness  Vital Signs:  Patient profile:   65 year old male Height:      67 inches Weight:      184 pounds BMI:     28.92 Pulse rate:   80 / minute Pulse rhythm:   irregular BP sitting:   100 / 56  (left arm) Cuff size:   large  Vitals Entered By: Danielle Rankin, CMA (November 02, 2009 11:15 AM)  Physical Exam  General:  no acute distress, hot stove or walking and, looks much older than stated age. Head:  normocephalic and atraumatic Eyes:  PERRLA/EOM intact; conjunctiva and lids normal. Neck:  Neck supple, no JVD. No masses, thyromegaly or abnormal cervical nodes. Lungs:  Clear bilaterally to auscultation and percussion. Heart:  nondisplaced PMI, regular rate and rhythm, normal S1-S2, no gallop Msk:  decreased ROM.  major deformity of his back Pulses:  pulses normal in all 4 extremities Extremities:  3+ left pedal edema and 3+ right pedal edema.   Neurologic:  Alert and oriented x 3. Skin:  Intact without lesions or rashes. Psych:  Normal affect.   Impression & Recommendations:  Problem # 1:  HYPERTENSION (ICD-401.9) Assessment Improved  His updated medication list for this problem includes:    Furosemide 40 Mg Tabs (Furosemide) ..... One once daily    Carvedilol 25 Mg Tabs (Carvedilol) .Marland Kitchen... Take 1 by mouth two times a day    Cozaar 100 Mg Tabs (Losartan potassium) .Marland Kitchen... Take 1 tablet by mouth once a day  Orders: EKG w/ Interpretation (93000)  Problem # 2:  EDEMA (ICD-782.3) Assessment: Deteriorated Since his Lasix was increased last week, his edema is improving. This is due to gravity and immobility. There is no sign of cardiac decompensation. I have cleared him for surgery.  Patient Instructions: 1)  Your physician recommends that you schedule a  follow-up appointment in: 6 MONTHS WITH DR Amorina Doerr 2)  Your physician recommends that you continue on your current medications as directed. Please refer to the Current Medication list given to you today.  Appended Document: [facility]      Allergies: 1)  ! Augmentin   EKG  Procedure date:  11/02/2009  Findings:      normal sinus rhythm, nonspecific ST segment changes, low voltage across anterior precordium, no change

## 2010-05-08 NOTE — Assessment & Plan Note (Signed)
Summary: F/U/OK PER DR Leilene Diprima/VS   Primary Provider:  Estill Bakes  CC:  1 month follow up.  History of Present Illness: 65 yo M with history of psoriatic arthritis, lumbar fusion, hip replacement. 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  States that he developed a knot on his L flank and had a CT scan and was told it was not a hernia.  Has been having fevers and cough last fall. improved after 3 courses of antibiotics and inhalers. has had fevers but improved over last month since given a course of avelox. he had grey sputum prev but this has resolved. has been on ariva and methotrexate now due to a high ESR (x6 weeks).  Now concerned that his L flank "lump" may be recurrence of his crypto.  He underwent CT abd/spine 06-21-09 showed nodular opacities RLL, loosening of spinal hardware,  as well as liver cysts. He was seen in ID clinic and had serum Crypto Ag negative. He had a repeat CT (with the intention of undergoing IR aspirate/bx)  "Resolving opacifications in the right lower lobe.  Findings are consistent with a resolving infectious or inflammatory process.  As a result, the percutaneous biopsy was cancelled.  A follow-up CT in 3-6 months would be useful to ensure complete resolution."  feels well now. Still has lump in flank- no change in size, non-tender. no f/c, eating well, moving bowels, passing urine well.   Preventive Screening-Counseling & Management  Alcohol-Tobacco     Alcohol drinks/day: 0     Smoking Status: quit     Year Quit: 20 yrs ago  Caffeine-Diet-Exercise     Caffeine use/day: 0     Does Patient Exercise: yes     Type of exercise: walking     Times/week: 7  Safety-Violence-Falls     Seat Belt Use: yes  Current Medications (verified): 1)  Synthroid 150 Mcg  Tabs (Levothyroxine Sodium) .... One Daily 2)  Prednisone 5 Mg  Tabs (Prednisone) .... 2 Daily 3)  Arava 20 Mg  Tabs (Leflunomide) .... One  Daily 4)  Endocet 10-650 Mg  Tabs (Oxycodone-Acetaminophen) .... One Every 8 Hours As Needed 5)  Cyclobenzaprine Hcl 10 Mg  Tabs (Cyclobenzaprine Hcl) .... One Every 8 Hours As Needed 6)  Calcium 600 600 Mg  Tabs (Calcium Carbonate) .... One Three Times A Day 7)  Vitamin D 400 Unit Caps (Cholecalciferol) .... Once Daily 8)  Temazepam 15 Mg  Caps (Temazepam) .... One or Two At Bedtime 9)  Furosemide 40 Mg  Tabs (Furosemide) .... One Once Daily 10)  Carvedilol 25 Mg Tabs (Carvedilol) .... Take 1 By Mouth Two Times A Day 11)  Cpap 12 12)  Symbicort 160-4.5 Mcg/act Aero (Budesonide-Formoterol Fumarate) .... I Puf Two Times A Day 13)  Vitamin C 500 Mg Chew (Ascorbic Acid) .... Take 1 By Mouth Once Daily 14)  Ferrous Sulfate 325 (65 Fe) Mg Tabs (Ferrous Sulfate) .... Once Daily 15)  Cozaar 100 Mg Tabs (Losartan Potassium) .... Take 1 Tablet By Mouth Once A Day 16)  Methotrexate Sodium 25 Mg/ml Soln (Methotrexate Sodium) .... 0.4cc Subcutaneously X1 Weekly 17)  Duragesic-100 100 Mcg/hr Pt72 (Fentanyl) .Marland Kitchen.. 1 Patch Every 3 Days 18)  Klor-Con M20 20 Meq Cr-Tabs (Potassium Chloride Crys Cr) .... Take 1 Tablet By Mouth Once A Day Per Pcp 19)  Diflucan 200 Mg Tabs (Fluconazole) .... 2 Tab By Mouth Once Daily 20)  Onglyza 5 Mg Tabs (Saxagliptin Hcl) .Marland Kitchen.. 1 Once Daily 21)  Fish Oil 1000 Mg Caps (Omega-3 Fatty Acids)  Allergies (verified): 1)  ! Augmentin    Updated Prior Medication List: SYNTHROID 150 MCG  TABS (LEVOTHYROXINE SODIUM) one daily PREDNISONE 5 MG  TABS (PREDNISONE) 2 daily ARAVA 20 MG  TABS (LEFLUNOMIDE) one daily ENDOCET 10-650 MG  TABS (OXYCODONE-ACETAMINOPHEN) one every 8 hours as needed CYCLOBENZAPRINE HCL 10 MG  TABS (CYCLOBENZAPRINE HCL) one every 8 hours as needed CALCIUM 600 600 MG  TABS (CALCIUM CARBONATE) one three times a day VITAMIN D 400 UNIT CAPS (CHOLECALCIFEROL) once daily TEMAZEPAM 15 MG  CAPS (TEMAZEPAM) one or two at bedtime FUROSEMIDE 40 MG  TABS (FUROSEMIDE) one  once daily CARVEDILOL 25 MG TABS (CARVEDILOL) take 1 by mouth two times a day * CPAP 12  SYMBICORT 160-4.5 MCG/ACT AERO (BUDESONIDE-FORMOTEROL FUMARATE) i puf two times a day VITAMIN C 500 MG CHEW (ASCORBIC ACID) take 1 by mouth once daily FERROUS SULFATE 325 (65 FE) MG TABS (FERROUS SULFATE) once daily COZAAR 100 MG TABS (LOSARTAN POTASSIUM) Take 1 tablet by mouth once a day METHOTREXATE SODIUM 25 MG/ML SOLN (METHOTREXATE SODIUM) 0.4cc subcutaneously x1 weekly DURAGESIC-100 100 MCG/HR PT72 (FENTANYL) 1 patch every 3 days KLOR-CON M20 20 MEQ CR-TABS (POTASSIUM CHLORIDE CRYS CR) Take 1 tablet by mouth once a day per PCP DIFLUCAN 200 MG TABS (FLUCONAZOLE) 2 tab by mouth once daily ONGLYZA 5 MG TABS (SAXAGLIPTIN HCL) 1 once daily FISH OIL 1000 MG CAPS (OMEGA-3 FATTY ACIDS)   Current Allergies (reviewed today): ! AUGMENTIN Vital Signs:  Patient profile:   65 year old male Height:      67 inches (170.18 cm) Weight:      179.8 pounds (81.73 kg) BMI:     28.26 Temp:     98.5 degrees F (36.94 degrees C) oral Pulse rate:   80 / minute BP sitting:   142 / 78  (right arm)  Vitals Entered By: Baxter Hire) (Aug 09, 2009 11:06 AM) CC: 1 month follow up Pain Assessment Patient in pain? yes     Location: lower back Intensity: 5 Type: aching Onset of pain  Constant Nutritional Status BMI of 25 - 29 = overweight Nutritional Status Detail appetite is good per patient  Does patient need assistance? Functional Status Self care Ambulation Normal   Physical Exam  General:  well-developed, well-nourished, and well-hydrated.   Ears:  there is a small ( ~76mm) skin tear behind his L ear with associated bruising. has flat edges,  it is non-tender, non-fluctuant.  Abdomen:  soft and non-tender.  area on L anterior flank with ? hernia/muscle defect.    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 He is certainly at high risk for this though. Will see him back as needed.   Orders: Est. Patient Level III (62952)  Medications Added to Medication List This Visit: 1)  Fish Oil 1000 Mg Caps (Omega-3 fatty acids)

## 2010-05-08 NOTE — Letter (Signed)
Summary: [facility]  [facility]   Imported By: Sherian Rein 01/15/2010 15:20:55  _____________________________________________________________________  External Attachment:    Type:   Image     Comment:   External Document

## 2010-05-08 NOTE — Miscellaneous (Signed)
Summary: [facility]:Orders  [facility]:Orders   Imported By: Florinda Marker 09/22/2009 14:33:30  _____________________________________________________________________  External Attachment:    Type:   Image     Comment:   External Document

## 2010-05-08 NOTE — Assessment & Plan Note (Signed)
Summary: 11AM NW PT FUO/OKAY PER [facility]   Primary Provider:  Estill Bakes  CC:  new patient.  History of Present Illness: 65 yo M with history of psoriatic arthritis, lumbar fusion, hip replacement. 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  States that he developed a knot on his L flank and had a CT scan and was told it was not a hernia.  Has been having fevers and cough last fall. improved after 3 courses of antibiotics and inhalers. has had fevers but improved over last month since given a course of avelox. he had grey sputum prev but this has resolved. has been on ariva and methotrexate now due to a high ESR (x6 weeks).  Now concerned that his L flank "lump" may be recurrence of his crypto.  He underwent CT abd/spine 06-21-09:   1.  No abdominal wall hernia is seen.   2.  Nodular parenchymal opacities in the posterior right lower   lobe.  Possible scarring but cannot exclude active inflammatory   process or possibly metastatic involvement of the lungs.   3.  Harrington rod fixation of the thoracolumbar spine with ray   cage fusion of the lower lumbar spine.   4.  Small nonobstructing left mid renal calculus. 1.  Interval extension of thoracolumbar fusion superiorly to T7.   2.  There is lucency surrounding the paired T7 and T8 pedicle   screws consistent with hardware loosening and nonunion.  Chronic   loosening of the T12 pedicle screws is unchanged.   3.  New ill-defined endplate lucency inferiorly at T6 with   associated anterior paraspinal soft tissue prominence most   compatible with abnormal motion and osseous resorption.  This could   reflect a subacute fracture.  Infection is considered less likely   given the absence of endplate changes at T7, but requires clinical   correlation.   1.  No acute findings are demonstrated in the lumbar spine status   post superior extension of the thoracolumbar fusion.   2.  Stable  loosening of the bilateral T12 pedicle screws and   disconnection of the posterior interconnecting rods at L2-L3.     Preventive Screening-Counseling & Management  Alcohol-Tobacco     Alcohol drinks/day: 0     Smoking Status: quit     Year Quit: 20 yrs ago  Caffeine-Diet-Exercise     Caffeine use/day: 0     Does Patient Exercise: yes     Type of exercise: walking     Times/week: 7  Safety-Violence-Falls     Seat Belt Use: yes   Current Allergies (reviewed today): ! AUGMENTIN Past History:  Past Medical History: COPD GERD Psoriatic arthritis Hypertension Hypothyroidism- steroid maint. 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   Vital Signs:  Patient profile:   65 year old male Height:      67 inches (170.18 cm) Weight:  188.12 pounds (85.51 kg) BMI:     29.57 Temp:     98.5 degrees F (36.94 degrees C) oral Pulse rate:   78 / minute BP sitting:   168 / 86  (right arm)  Vitals Entered By: Baxter Hire) (July 14, 2009 11:08 AM) CC: new patient Is Patient Diabetic? Yes Pain Assessment Patient in pain? yes     Location: upper back Intensity: 5 Type: throbbing Onset of pain  Constant Nutritional Status BMI of 25 - 29 = overweight Nutritional Status Detail appetite is good per patient  Does patient need assistance? Functional Status Self care Ambulation Normal   Physical Exam  General:  well-developed, well-nourished, and well-hydrated.   Eyes:  pupils equal, pupils round, and pupils reactive to light.     Mouth:  pharynx pink and moist and no exudates.   Neck:  no masses.   Lungs:  normal respiratory effort and normal breath sounds.   Heart:  normal rate, regular rhythm, and no murmur.   Abdomen:  soft, non-tender, and normal bowel sounds.  ventral hernia, easily reducible.    Detailed Back/Spine Exam  General:    hardware palpable, nontender.    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 (He quit smoking 15 years ago).  Certainly, given his immunne compromise, these could be return of his Crypto. He will f/u with his pulmonary Dr next week, I will rely on him to eval his CT and to consider referring him to [facility] for biopsy of these lesions.  In the intervening period, will check his CMP, restart his diflucan and check his serum crypto Ag.  return to clinic 3-4 weeks.   Orders: T-Comprehensive Metabolic Panel [id number]) T- * Misc. Laboratory test [id number]) Consultation Level IV [phone])  Medications Added to Medication List This Visit: 1)  Diflucan 200 Mg Tabs (Fluconazole) .... 2 tab by mouth once daily Prescriptions: DIFLUCAN 200 MG TABS (FLUCONAZOLE) 2 tab by mouth once daily  #60 x 3   Entered and Authorized by:   Johny Sax MD   Signed by:   Johny Sax MD on 07/14/2009   Method used:   Electronically to        [facility]* (retail)       [address]       [place], [place]  [address]       Ph: [phone] or [phone]       Fax: [phone]   RxID:   [id number]  Process Orders Check Orders Results:     [facility]: Order checked:     [id number] -- T- * Misc. Laboratory test -- No CPT codes found (CPT: ) Tests Sent for requisitioning (July 14, 2009 12:01 PM):     07/14/2009: [facility] -- T-Comprehensive Metabolic Panel [[id number]] (signed)     07/14/2009: [facility] -- T-  * Misc. Laboratory test [id number] (signed)

## 2010-05-08 NOTE — Miscellaneous (Signed)
Summary: Problems and Medications updated  Clinical Lists Changes  Problems: Added new problem of FEVER UNSPECIFIED (ICD-780.60) Added new problem of DEGENERATIVE DISC DISEASE (ICD-722.6) Added new problem of HIP REPLACEMENT, LEFT, HX OF (ICD-V43.64) Added new problem of OTHER POSTSURGICAL STATUS OTHER (ICD-V45.89) - lumbar surgery, fusion, surgeries x 3 Added new problem of HYPERLIPIDEMIA (ICD-272.4) Medications: Added new medication of KLOR-CON M20 20 MEQ CR-TABS (POTASSIUM CHLORIDE CRYS CR) Take 1 tablet by mouth once a day per PCP Removed medication of TRAMADOL HCL 50 MG TABS (TRAMADOL HCL) 1 every 6 hours as needed pain

## 2010-05-08 NOTE — Assessment & Plan Note (Signed)
Summary: f61m  Medications Added DIOVAN 320 MG  TABS (VALSARTAN) Take 1 tablet by mouth once a day fisnish current rx then discontinue COZAAR 100 MG TABS (LOSARTAN POTASSIUM) Take 1 tablet by mouth once a day      Allergies Added:   Visit Type:  Follow-up Primary Provider:  Estill Bakes   History of Present Illness: Johnny Patrick comes in today for followup evaluation and management of his difficult to treat hypertension and lower study edema.  His echocardiogram this past summer showed improvement in his left ventricular function with an EF of 55-60%. He had mild left atrial enlargement and mild aortic valve regurgitation. The right a 2 and right ventricle were normal in size with no evidence of pulmonary hypertension.  From cardiac standpoint and doing well. He's had a lot of spine problems and has had to have repeated surgery as recently as October. His lower shin edema has mostly resolved.  Current Medications (verified): 1)  Synthroid 150 Mcg  Tabs (Levothyroxine Sodium) .... One Daily 2)  Prednisone 5 Mg  Tabs (Prednisone) .... 2 Daily 3)  Arava 20 Mg  Tabs (Leflunomide) .... One Daily 4)  Endocet 10-650 Mg  Tabs (Oxycodone-Acetaminophen) .... One Every 8 Hours As Needed 5)  Cyclobenzaprine Hcl 10 Mg  Tabs (Cyclobenzaprine Hcl) .... One Every 8 Hours As Needed 6)  Calcium 600 600 Mg  Tabs (Calcium Carbonate) .... One Three Times A Day 7)  Vitamin D 400 Unit Caps (Cholecalciferol) .... Once Daily 8)  Temazepam 15 Mg  Caps (Temazepam) .... One or Two At Bedtime 9)  Diovan 320 Mg  Tabs (Valsartan) .... Take 1 Tablet By Mouth Once A Day Fisnish Current Rx Then Discontinue 10)  Furosemide 40 Mg  Tabs (Furosemide) .... One Once Daily 11)  Carvedilol 25 Mg Tabs (Carvedilol) .... Take 1 By Mouth Two Times A Day 12)  Cpap 12 13)  Symbicort 160-4.5 Mcg/act Aero (Budesonide-Formoterol Fumarate) .... I Puf Two Times A Day 14)  Vitamin C 500 Mg Chew (Ascorbic Acid) .... Take 1 By Mouth Once Daily 15)   Ferrous Sulfate 325 (65 Fe) Mg Tabs (Ferrous Sulfate) .... Once Daily 16)  Doxycycline Hyclate 100 Mg Tabs (Doxycycline Hyclate) .... 2 Today Then One Daily 17)  Tramadol Hcl 50 Mg Tabs (Tramadol Hcl) .Marland Kitchen.. 1 Every 6 Hours As Needed Pain 18)  Cozaar 100 Mg Tabs (Losartan Potassium) .... Take 1 Tablet By Mouth Once A Day  Allergies (verified): 1)  ! Augmentin  Past History:  Past Medical History: Last updated: 09/09/08 COPD GERD Psoriatic arthritis Hypertension Hypothyroidism- steroid maint. Nephrolithiasis, hx of Osteoarthritis Low back pain Cryptococcus Left arm 2008 MSSA toe 2009 Septic shock 2009  Past Surgical History: Last updated: September 09, 2008 Surgery toe for staph inf 2009 Left hip replaced  Stabilizing screws in spine 2005 L3-5  Family History: Last updated: 09-09-2008 Father- died MVA Mother- died CAD, MRSA pneumonia, CHF  Social History: Last updated: 2008-09-09  The patient is married, retired, and disabled secondary   to his long back problems.  His wife is a retired Engineer, civil (consulting) who can assist   at discharge.  The patient has had poor mobility secondary to his   worsening hip problems in the left.   Risk Factors: Caffeine Use: 0 (03/17/2007) Exercise: yes (03/17/2007)  Risk Factors: Smoking Status: quit (07/21/2007)  Review of Systems       negative other than history of present illness  Vital Signs:  Patient profile:   65 year old male  Weight:      192 pounds BMI:     31.10 Pulse rate:   92 / minute BP sitting:   135 / 80  (right arm)  Vitals Entered By: Dreama Saa, CNA (April 17, 2009 3:21 PM)  Physical Exam  General:  pleasant, overweight male in no acute distress. Head:  normocephalic and atraumatic Eyes:  PERRLA/EOM intact; conjunctiva and lids normal. Neck:  Neck supple, no JVD. No masses, thyromegaly or abnormal cervical nodes. Lungs:  Clear bilaterally to auscultation and percussion. Heart:  regular rate and rhythm, normal  S1-S2, no right ventricular lift, no obvious diastolic murmur Msk:  decreased ROM.   Pulses:  pulses normal in all 4 extremities Extremities:  trace left pedal edema and trace right pedal edema.   Neurologic:  Alert and oriented x 3. Skin:  Intact without lesions or rashes. Psych:  Normal affect.   Problems:  Medical Problems Added: 1)  Dx of Edema  (ICD-782.3)  Impression & Recommendations:  Problem # 1:  HYPERTENSION (ICD-401.9) Assessment Improved I will switch him over to losartan 100 mg per morning. We will bring him back for blood pressure check with the nurses in a couple weeks. This will save him about $300 per year which he said helped him. His updated medication list for this problem includes:    Diovan 320 Mg Tabs (Valsartan) .Marland Kitchen... Take 1 tablet by mouth once a day fisnish current rx then discontinue    Furosemide 40 Mg Tabs (Furosemide) ..... One once daily    Carvedilol 25 Mg Tabs (Carvedilol) .Marland Kitchen... Take 1 by mouth two times a day    Cozaar 100 Mg Tabs (Losartan potassium) .Marland Kitchen... Take 1 tablet by mouth once a day  Problem # 2:  PEDAL EDEMA (ICD-782.3) Assessment: Improved  Patient Instructions: 1)  Your physician recommends that you schedule a follow-up appointment in: 6 months 2)  Your physician has recommended you make the following change in your medication: finish current prescription of diovan then switch to losartan 100mg  daily 3)  check bp daily and nurse visit in 1 month Prescriptions: COZAAR 100 MG TABS (LOSARTAN POTASSIUM) Take 1 tablet by mouth once a day  #30 x 6   Entered by:   Teressa Lower RN   Authorized by:   Gaylord Shih, MD, [facility]   Signed by:   Teressa Lower RN on 04/17/2009   Method used:   Electronically to        [facility] (retail)       [address]       [place], [place]  [address]       Ph: [phone] or [phone]       Fax: [phone]   RxID:   [id number]

## 2010-05-08 NOTE — Assessment & Plan Note (Signed)
Summary: rov 6 months///kp   Primary Johnny Patrick/Referring Johnny Patrick:  Johnny Patrick  CC:  6 month follow up visit-sleep; doing well with CPAP.Marland Kitchen  History of Present Illness: July 18, 2009- Allergic rhinitis, COPD, OSA, Hx Crypto......................................Marland Kitchenwife here Had taken Avelox in early March resulting in clearing of cough productive gray sputum. He was running fever then. Wife says that temp is down, but still some fluctuation. CT Abd 3/16- had shown nodular densities in posterior RLL. The CT was done by Dr Johnny Patrick evaluating bulge left abdominal wall- possible hernia. Hx Crypto soft tissue infection. Recent Crypto antigen negative by Dr Johnny Patrick. Wife says recent blood work at [facility] showed "inflamation in  the blood":  Aug 24, 2009- Allergic rhinitis, COPD, OSA, Hx Crypto...............................Marland Kitchenwife here Needle lung bx was cancelled when resoling LLL infiltrate indicated this had been infalmmatory. Saw Dr Johnny Patrick who felt recurrent crypto unlikely but will continue to follow. He is limited by recurrent pain from his degenerative back surgery. Wife asks for a pain shot to let him rest. I reviewed my discussion with radiology that had favored radiculopathy from his back to explain muscle laxity/ bulge left lateral chest, and pain. He denies dyspnea, ratle or congestion, productive cough, fever or sweat.  He continues to use CPAP 12 all night every night.  February 20, 2010-  Allergic rhinitis, COPD, OSA, Hx Crypto...............................Marland Kitchenwife here Nurse-CC: 6 month follow up visit-sleep; doing well with CPAP. Had rods removed from spine. Has seen Dr Johnny Patrick for anemia of chronic disease. Wife comments on cough especially at night. He feels it as throat clearing. Has lost weight "fluid". Bothersome watery rhinorhea.  CT in April showed marked improvement in opacification, c/w resolving inflammatory process, so planned needle bx was cancelled.    Notes nodules in legs since he lost weight.    Preventive Screening-Counseling & Management  Alcohol-Tobacco     Alcohol drinks/day: 0     Smoking Status: quit     Year Quit: 20 yrs ago  Current Medications (verified): 1)  Synthroid 150 Mcg  Tabs (Levothyroxine Sodium) .... One Daily 2)  Prednisone 5 Mg  Tabs (Prednisone) .... Take 1 Tablet By Mouth Once A Day 3)  Arava 20 Mg  Tabs (Leflunomide) .... One Daily 4)  Endocet 10-650 Mg  Tabs (Oxycodone-Acetaminophen) .... One Every 8 Hours As Needed 5)  Cyclobenzaprine Hcl 10 Mg  Tabs (Cyclobenzaprine Hcl) .... One Every 8 Hours As Needed 6)  Calcium 600 600 Mg  Tabs (Calcium Carbonate) .... One Three Times A Day 7)  Vitamin D 400 Unit Caps (Cholecalciferol) .... Once Daily 8)  Temazepam 15 Mg  Caps (Temazepam) .... One or Two At Bedtime 9)  Furosemide 40 Mg  Tabs (Furosemide) .... One Once Daily 10)  Carvedilol 25 Mg Tabs (Carvedilol) .... Take 1 By Mouth Two Times A Day 11)  Cpap 12 .... [facility] 12)  Symbicort 160-4.5 Mcg/act Aero (Budesonide-Formoterol Fumarate) .... I Puf Two Times A Day 13)  Vitamin C 500 Mg Chew (Ascorbic Acid) .... Take 1 By Mouth Once Daily 14)  Cozaar 100 Mg Tabs (Losartan Potassium) .... Take 1 Tablet By Mouth Once A Day 15)  Duragesic-100 100 Mcg/hr Pt72 (Fentanyl) .Marland Kitchen.. 1 Patch Every 3 Days 16)  Klor-Con M20 20 Meq Cr-Tabs (Potassium Chloride Crys Cr) .... Take 1 Tablet By Mouth Once A Day Per Pcp 17)  Fish Oil 1000 Mg Caps (Omega-3 Fatty Acids) .... Three Times A Day 18)  Neurontin 300 Mg Caps (Gabapentin) .... Take One  Capsule By Mouth Every 8 Hours 19)  Dilaudid 2 Mg Tabs (Hydromorphone Hcl) .... Take One Tablet By Mouth Every 4 Hours 20)  Folic Acid 1 Mg Tabs (Folic Acid) .Marland Kitchen.. 1 Tab Once Daily 21)  Humira 20 Mg/0.7ml Kit (Adalimumab) .... Take Once Week  Allergies (verified): 1)  ! Augmentin  Past History:  Family History: Last updated: 2009-07-23 Father- died MVA Mother- died CAD,  MRSA pneumonia, CHF, hx of Graves Dz  Social History: Last updated: 09/03/2008  The patient is married, retired, and disabled secondary   to his long back problems.  His wife is a retired Engineer, civil (consulting) who can assist   at discharge.  The patient has had poor mobility secondary to his   worsening hip problems in the left.   Risk Factors: Alcohol Use: 0 (112-04-2009) Caffeine Use: 0 (10/04/2009) Exercise: no (10/04/2009)  Risk Factors: Smoking Status: quit (112-04-2009)  Past Medical History: COPD GERD Psoriatic arthritis Hypertension Hypothyroidism- steroid maint. 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  The patient denies shortness of breath at rest, non-productive cough, coughing up blood, chest pain, irregular heartbeats, acid heartburn, indigestion, loss of appetite, weight change, abdominal pain, difficulty swallowing, sore throat, tooth/dental problems, headaches, nasal congestion/difficulty breathing through nose, sneezing, ear ache, rash, change in color of mucus, and fever.    Vital Signs:  Patient profile:   65 year old male Height:      67 inches Weight:      160.25 pounds BMI:     25.19 O2 Sat:      91 % on Room air Pulse rate:   80 / minute BP sitting:   130 / 80  (left arm) Cuff size:   regular  Vitals Entered  By: Johnny Patrick CMA (February 20, 2010 1:58 PM)  O2 Flow:  Room air CC: 6 month follow up visit-sleep; doing well with CPAP.   Physical Exam  Additional Exam:  General: A/Ox3; pleasant and cooperative, NAD, SKIN: no rash, lesions-  NODES: no lymphadenopathy HEENT: [place]/AT, EOM- WNL, Conjuctivae- clear, PERRLA, TM-WNL, Nose- clear, Throat- clear and wnl, Mallampati  II NECK: Supple w/ fair ROM, JVD- none, normal carotid impulses w/o bruits Thyroid- CHEST: fine crackle to mid back, fair airflow, unlabored, no dullness or rub. HEART: RRR, no m/g/r heard ABDOMEN: bulge left lateral chest wall/  YQM:VHQI, nl pulses,  1+ edema left ankle, , using a cane. Subcutaneous matted nodules in right lateral thigh, nontender. NEURO: Grossly intact to observation      CT of Chest  Procedure date:  07/26/2009  Findings:      CT CHEST W/O CM - [id number]   Clinical Data: 64-year male with history of psoriatic arthritis and history of pulmonary infections.  The patient has suspicious opacifications in the right  lower lobe on a CT from 06/21/2009. The patient was scheduled for a right lower lobe lung biopsy.   CT CHEST WITHOUT CONTRAST   Technique:  Multidetector CT imaging of the chest was performed following the standard protocol without IV contrast.   Comparison: Abdominal CT dated 06/21/2009 and chest CT dated 01/29/2005   Findings: The images were obtained in a prone position.  There is no significant mediastinal, hilar or axillary lymphadenopathy. There is no significant pericardial or pleural effusion.  The patient has a moderate sized hiatal hernia.  Again seen are scattered low density areas throughout the liver that appears stable from the chest CT in 2006.  The low density structures most likely represent cysts.  The adrenal tissue is normal.  Visualized upper abdominal structures are within normal limits.  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 The trachea and mainstem bronchi are patent. The opacifications in the right lower lobe have nearly resolved.  There are residual linear and patchy densities in the right lower lobe.  Again seen is a 5 mm subpleural nodule along the right major fissure.  This nodule has been present since 2006.  Stable calcification at the medial base of the right middle lobe.  There is also a stable calcified granuloma in the superior segment of the left lower lobe. The patient has multilevel pedicle screw and rod fixation in the thoracic spine.  There is marked disc space loss at T6-7.  Old fracture involving the right tenth.  Question an old injury to the inferior aspect of the right scapula.   IMPRESSION: Resolving opacifications in the right lower lobe.  Findings are consistent with a resolving infectious or inflammatory process.  As a result, the percutaneous biopsy was cance  Impression & Recommendations:  Problem # 1:  LUNG NODULE (ICD-518.89)  Continue surveillance, but we agreed to be conservative.  Problem # 2:  COPD (ICD-496) He has residual fine crackles after weight loss from diuresis, but no wheeze or rhonchi. I don't think there is much room to push bronchodilators.   Had flu shot.  Medications Added to Medication List This Visit: 1)  Cpap 12  .... [facility] 2)  Fish Oil 1000 Mg Caps (Omega-3 fatty acids) .... Three times a day 3)  Humira 20 Mg/0.9ml Kit (Adalimumab) .... Take once week  Other Orders: Est. Patient Level IV (16109)  Patient Instructions: 1)  Please schedule a follow-up appointment in 6 months. Call sooner if needed.     CT of Chest  Procedure date:  07/26/2009  Findings:      CT CHEST W/O CM - [id number]   Clinical Data: 64-year male with history of psoriatic arthritis and history of pulmonary infections.  The patient has suspicious opacifications in the right lower lobe on a CT from 06/21/2009. The patient was scheduled for a right  lower lobe lung biopsy.   CT CHEST WITHOUT CONTRAST   Technique:  Multidetector CT imaging of the chest was performed following the standard protocol without IV contrast.   Comparison: Abdominal CT dated 06/21/2009 and chest CT dated 01/29/2005   Findings: The images were obtained in a prone position.  There is no significant mediastinal, hilar or axillary lymphadenopathy. There is no significant pericardial or pleural effusion.  The patient has a moderate sized hiatal hernia.  Again seen are scattered low density areas throughout the liver that appears stable from the chest CT in 2006.  The low density structures most likely  represent cysts.  The adrenal tissue is normal.  Visualized upper abdominal structures are within normal limits.  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 The trachea and mainstem bronchi are patent. The opacifications in the right lower lobe have nearly resolved.  There are residual linear and patchy densities in the right lower lobe.  Again seen is a 5 mm subpleural nodule along the right major fissure.  This nodule has been present since 2006.  Stable calcification at the medial base of the right middle lobe.  There is also a stable calcified granuloma in the superior segment of the left lower lobe. The patient has multilevel pedicle screw and rod fixation in the thoracic spine.  There is marked disc space loss at T6-7.  Old fracture involving the right tenth.  Question an old injury to the inferior aspect of the right scapula.   IMPRESSION: Resolving opacifications in the right lower lobe.  Findings are consistent with a resolving infectious or inflammatory process.  As a result, the percutaneous biopsy was cance

## 2010-05-08 NOTE — Progress Notes (Signed)
Summary: refill temazepam  Phone Note Refill Request Call back at [facility] from:  Fax from Pharmacy on February 06, 2010 11:47 AM  Refills Requested: Medication #1:  TEMAZEPAM 15 MG  CAPS one or two at bedtime   Dosage confirmed as above?Dosage Confirmed   Supply Requested: 1 month   Last Refilled: 01/05/2010 last OV w/ CDY 5.19.11, next 11.15.11.  please advise if okay to send refills, thanks!  Initial call taken by: Boone Master CNA/MA,  February 06, 2010 11:48 AM  Follow-up for Phone Call        OK to refill Temazepam for total of 6 months Follow-up by: Waymon Budge MD,  February 06, 2010 12:51 PM  Additional Follow-up for Phone Call Additional follow up Details #1::        refill sent.Carron Curie CMA  February 06, 2010 3:01 PM     Prescriptions: TEMAZEPAM 15 MG  CAPS (TEMAZEPAM) one or two at bedtime  #60 x 5   Entered by:   Carron Curie CMA   Authorized by:   Waymon Budge MD   Signed by:   Carron Curie CMA on 02/06/2010   Method used:   Telephoned to ...       [facility]* (retail)       [address]       [place], [place]  [address]       Ph: [phone] or [phone]       Fax: [phone]   RxID:   [id number]

## 2010-05-08 NOTE — Progress Notes (Signed)
Summary: pt needs sooner appt    Phone Note Call from Patient Call back at Home Phone [phone]   Reason for Call: Talk to Nurse, Talk to Doctor Summary of Call: pt wants to be seen in [place] office because Dr. Daleen Squibb dosen't have anything in [place] and pt having surgery Initial call taken by: Omer Jack,  September 29, 2009 8:26 AM  Follow-up for Phone Call        I called and spoke with the pt. He states he never received a reminder letter stating he was due for his f/u in 7/11. He wanted to go ahead and get an appt. since he is pending back surgery on 11/07/09. He wanted to see Dr. Daleen Squibb in [place] since the [place] office has no openings prior to his surgery date. Appt scheduled for 7/28 (first available) @ 11:00am with Dr. Daleen Squibb in [place]. The pt is greatful for the appt.  Follow-up by: Sherri Rad, RN, BSN,  September 29, 2009 8:43 AM

## 2010-05-08 NOTE — Letter (Signed)
Summary: [facility]  [facility]   Imported By: Lennie Odor 02/19/2010 10:59:47  _____________________________________________________________________  External Attachment:    Type:   Image     Comment:   External Document

## 2010-05-08 NOTE — Miscellaneous (Signed)
Summary: CBC, CMP, and TSH 06-22-08  Clinical Lists Changes  Observations: Added new observation of CALCIUM: 9.7 mg/dL (35/00/9381 82:99) Added new observation of ALBUMIN: 4.1 g/dL (37/16/9678 93:81) Added new observation of PROTEIN, TOT: 6.9 g/dL (01/75/1025 85:27) Added new observation of SGPT (ALT): 23 units/L (04/17/2009 10:29) Added new observation of SGOT (AST): 23 units/L (04/17/2009 10:29) Added new observation of ALK PHOS: 69 units/L (04/17/2009 10:29) Added new observation of BILI DIRECT: Bili total: 0.4 mg/dL (78/24/2353 61:44) Added new observation of CREATININE: 0.95 mg/dL (31/54/0086 76:19) Added new observation of BUN: 22 mg/dL (50/93/2671 24:58) Added new observation of BG RANDOM: 142 mg/dL (09/98/3382 50:53) Added new observation of CO2 PLSM/SER: 28 meq/L (04/17/2009 10:29) Added new observation of CL SERUM: 102 meq/L (04/17/2009 10:29) Added new observation of K SERUM: 5.2 meq/L (04/17/2009 10:29) Added new observation of NA: 142 meq/L (04/17/2009 10:29) Added new observation of PLATELETK/UL: 253 K/uL (04/17/2009 10:29) Added new observation of MCV: 91.6 fL (04/17/2009 10:29) Added new observation of HCT: 40.4 % (04/17/2009 10:29) Added new observation of HGB: 12.8 g/dL (97/67/3419 37:90) Added new observation of WBC COUNT: 5.5 10*3/microliter (04/17/2009 10:29) Added new observation of TSH: 0.260 microintl units/mL (04/17/2009 10:29)

## 2010-05-08 NOTE — Progress Notes (Signed)
Summary: Orders about pulling PIC  Phone Note Call from Patient   Caller: Jennifer-Nurse Summary of Call: Johnny Patrick called and that the patients last day of IV antibiotics will be on 10-07-09 and will need orders to pull PIC. Initial call taken by: Kathi Simpers [facility]),  October 03, 2009 10:40 AM  Follow-up for Phone Call        please pull PIC on 10-07-09 thanks

## 2010-05-08 NOTE — Assessment & Plan Note (Signed)
Summary: 12 months/apc   Primary Provider/Referring Provider:  Estill Bakes  CC:  Yearly followup.  Pt states no complaints today.  Would like to discuss recent CT Abdomen that he was told showed multiple nodules in left lung.  Marland Kitchen  History of Present Illness: 01/21/08- Wife w/ him. Now breathing, nose and sinuses comfortable, feel normal. Denies cough, phlegm, chest pain, palpitation. Facing surgery left hip. Uses CPAP 12, no problem. Remains on maint pred fro psoriatic arthritis. Pneumovax in hosp 2009. We had scheduled PFT previously but I don't find that it was ever done.  07/19/08- allergic rhinitis,  COPD, OSA , Psoriatic arthritis Had left hip replaced, no problem. Will need to have bolts replaced in spine. No respiratory complications with anesthesia surgery. Tolerating spring pollen season withut cough or wheeze.  April 14, 2009- Allergic rhinitis, COPD, OSA..................................Marland Kitchenwife here Pain still after Dr Channing Mutters did lumbar and thoracic spine surgery in October- low right post flank. No rash. Has been coughing with chest congestion x past month. Grey/ yellow non blooody sputum. Says Fentanyl hasn't helped. Sleeps on recliner. Not dyspneic. Has had shingles twice. Remains compliant eith CPAP,  July 18, 2009- Allergic rhinitis, COPD, OSA, Hx Crypto......................................Marland Kitchenwife here Had taken Avelox in early March resulting in clearing of cough productive gray sputum. He was running fever then. Wife says that temp is down, but still some fluctuation. CT Abd 3/16- had shown nodular densities in posterior RLL. The CT was done by Dr [name] evaluating bulge left abdominal wall- possible hernia. Hx Crypto soft tissue infection. Recent Crypto antigen negative by Dr [name]. Wife says recent blood work at [facility] showed "inflamation in  the blood":     Current Medications (verified): 1)  Synthroid 150 Mcg  Tabs (Levothyroxine Sodium) .... One Daily 2)   Prednisone 5 Mg  Tabs (Prednisone) .... 2 Daily 3)  Arava 20 Mg  Tabs (Leflunomide) .... One Daily 4)  Endocet 10-650 Mg  Tabs (Oxycodone-Acetaminophen) .... One Every 8 Hours As Needed 5)  Cyclobenzaprine Hcl 10 Mg  Tabs (Cyclobenzaprine Hcl) .... One Every 8 Hours As Needed 6)  Calcium 600 600 Mg  Tabs (Calcium Carbonate) .... One Three Times A Day 7)  Vitamin D 400 Unit Caps (Cholecalciferol) .... Once Daily 8)  Temazepam 15 Mg  Caps (Temazepam) .... One or Two At Bedtime 9)  Furosemide 40 Mg  Tabs (Furosemide) .... One Once Daily 10)  Carvedilol 25 Mg Tabs (Carvedilol) .... Take 1 By Mouth Two Times A Day 11)  Cpap 12 12)  Symbicort 160-4.5 Mcg/act Aero (Budesonide-Formoterol Fumarate) .... I Puf Two Times A Day 13)  Vitamin C 500 Mg Chew (Ascorbic Acid) .... Take 1 By Mouth Once Daily 14)  Ferrous Sulfate 325 (65 Fe) Mg Tabs (Ferrous Sulfate) .... Once Daily 15)  Cozaar 100 Mg Tabs (Losartan Potassium) .... Take 1 Tablet By Mouth Once A Day 16)  Methotrexate Sodium 25 Mg/ml Soln (Methotrexate Sodium) .... 0.4cc Subcutaneously X1 Weekly 17)  Duragesic-100 100 Mcg/hr Pt72 (Fentanyl) .Marland Kitchen.. 1 Patch Every 3 Days 18)  Klor-Con M20 20 Meq Cr-Tabs (Potassium Chloride Crys Cr) .... Take 1 Tablet By Mouth Once A Day Per Pcp 19)  Diflucan 200 Mg Tabs (Fluconazole) .... 2 Tab By Mouth Once Daily 20)  Onglyza 5 Mg Tabs (Saxagliptin Hcl) .Marland Kitchen.. 1 Once Daily  Allergies (verified): 1)  ! Augmentin  Past History:  Past Medical History: Last updated: 07/14/2009 COPD GERD Psoriatic arthritis Hypertension Hypothyroidism- steroid maint. 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  His wife is a retired Engineer, civil (consulting) who can assist   at discharge.  The patient has had poor mobility secondary to his   worsening hip problems in the left.   Risk Factors: Alcohol Use: 0 (08-01-2009) Caffeine Use: 0 (01-Aug-2009) Exercise: yes (08-01-09)  Risk Factors: Smoking Status: quit (2009/08/01)  Review of Systems      See HPI  The patient denies anorexia, fever, weight loss, weight gain, vision loss, decreased hearing, hoarseness, chest pain, syncope, dyspnea on exertion, peripheral edema, prolonged cough, headaches, hemoptysis, abdominal pain, and severe indigestion/heartburn.    Vital Signs:  Patient profile:   65 year old male Weight:      184 pounds O2 Sat:      95 % on Room air Pulse rate:   75 / minute BP sitting:   140 / 80  (left arm)  Vitals Entered By: Vernie Murders (July 18, 2009 1:44 PM)  O2 Flow:  Room air  Physical Exam  Additional Exam:  General: A/Ox3; pleasant and cooperative, NAD, SKIN: no rash, lesions- no obvious shingles NODES: no lymphadenopathy HEENT: [place]/AT, EOM- WNL, Conjuctivae- clear, PERRLA, TM-WNL, Nose- clear, Throat- clear and wnl NECK: Supple w/ fair ROM, JVD- none, normal carotid impulses w/o bruits Thyroid- CHEST: Trace sqeaks, fair airflow, unlabored, no dullness or rub. HEART: RRR, no m/g/r heard ABDOMEN: bulge left lateral chest wall/ upper abdomen is cool, nontender, compressible. GNF:AOZH, nl pulses,  1+ edema left ankle, , using a cane NEURO: Grossly intact to observation      Impression & Recommendations:  Problem # 1:  LUNG NODULE  (ICD-518.89)  Lung nodules on RLL by CT might be neoplasm or granulomatous inflamation. Note that Crypto antigen is negative. We have carefully discussed and I will ask interventional radiology to review for possible needle bx. We considered alternatives of watchful waiting and surgical referral. His wife in particular is not interested in delay. Addend: I spoke with Dr Bonnielee Haff at Radiology. He feels the RLL nodules are approachable by needle bx.  Problem # 2:  ABDOMINAL/PELVIC SWELLING MASS/LUMP UNSPEC SITE (ICD-789.30)  I favor the bulge on his left lateral upper abd/ lower chest as being a hernia or atonic muscle, without prior surgery there. It may be positional and not seen on CT. Dr [name] had seen him for this and his office told him nothing to be done. Dr. Bonnielee Haff (Personal phone communiciation today) on review of CT felt the bulge was not a hernia or cyst and showed muscle thinning c/w muscle atony. We considered possible relation to his previous spine surgery. No intervention needed.   Medications Added to Medication List This Visit: 1)  Duragesic-100 100 Mcg/hr Pt72 (Fentanyl) .Marland Kitchen.. 1 patch every 3 days 2)  Onglyza 5 Mg Tabs (Saxagliptin hcl) .Marland KitchenMarland KitchenMarland Kitchen  1 once daily  Other Orders: Est. Patient Level III (41324) Radiology Referral (Radiology)  Patient Instructions: 1)  Please schedule a follow-up appointment in 1 month. 2)  I will contact interventional radiology to review your CT about possible needle biopsy of lung nodule, and also to ask if they see anything on your CT corresponding to the bulge on your left upper abdomen.  Appended Document: 12 months/apc Review of abdmnal CT - phone discussion with radiologist. The nodule in right lower lobe can be sampled by needle biopsy. I will order this as discussed in the office. The bulge on the left abdominal wall is an area of weak/ relaxed muscle, maybe related to his old spine surgery. There is not anything to be done about it.  Appended  Document: 12 months/apc Pt aware of appt.

## 2010-05-08 NOTE — Assessment & Plan Note (Signed)
Summary: hsfu  need chart/   Vital Signs:  Patient profile:   65 year old male Height:      67 inches (170.18 cm) Weight:      185.0 pounds (84.09 kg) BMI:     29.08 Temp:     98.3 degrees F (36.83 degrees C) oral Pulse rate:   80 / minute BP sitting:   118 / 73  (left arm)  Vitals Entered By: Baxter Hire) (October 04, 2009 11:01 AM) CC: hsfu /  feet and legs swollen Pain Assessment Patient in pain? yes     Location: back Intensity: 10 Type: sharp Onset of pain  Constant Nutritional Status BMI of 25 - 29 = overweight Nutritional Status Detail appetite is good per patient  Does patient need assistance? Functional Status Self care Ambulation Normal   Primary Provider:  Estill Bakes  CC:  hsfu /  feet and legs swollen.  History of Present Illness:  65 yo M with DM2, psoriatic arthritis,  on prednisone and methotrexate, who has a prior history of right-sided   foot MSSA infection leading to sepsis in 2009.  He had recovered from   that and was doing well at home when he developed pain and redness and   drainage on his left second toe.  This is apparently a hammertoe and he  thinks he may have had some pressure on it.  He came to the emergency  room and was admitted.  He was seen by orthopedics.  MRI did not reveal osteomyelitis but did show diffuse cellulitis.  He had amputation done of the left second toe on 09-08-09.  Cultures from   prior to surgery revealed MSSA and operative Cx were MSSA as well.   Today c/o back pain, states he has sched to have repeat back surgery (has rods) in July.   His toe site has healed well and he is having no problems with the site. No probs with PIC line. Plan for antibiotics to complete on July 2.   Preventive Screening-Counseling & Management  Alcohol-Tobacco     Alcohol drinks/day: 0     Smoking Status: quit     Year Quit: 20 yrs ago  Caffeine-Diet-Exercise     Caffeine use/day: 0     Does Patient Exercise:  no  Safety-Violence-Falls     Seat Belt Use: yes  Allergies: 1)  ! Augmentin  Past History:  Past Medical History: COPD GERD Psoriatic arthritis Hypertension Hypothyroidism- steroid maint. 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 FSG have been running "good", is not able to keep his legs elavated, sleeps sitting up due to back pain.   Physical Exam  General:  well-developed, well-nourished, and well-hydrated.   Eyes:  pupils equal, pupils round, and pupils reactive to light.   Mouth:  pharynx pink and moist and no exudates.   Neck:  no masses.   Lungs:  normal respiratory effort and normal breath sounds.   Heart:  normal rate, regular rhythm, and no murmur.   Abdomen:  soft, non-tender, and normal bowel sounds.   Extremities:  left pretibial edema and right pretibial edema.   His L 2nd toe wound site is well, healed, non-tender, non-fluctuant.    Impression & Recommendations:  Problem # 1:  LOWER LIMB AMPUTATION, OTHER TOE (ICD-V49.72)  He is doing well. His wound is well healed. The greatest threats to his foot at ths point are his massive edema, immunoisuppresion and DM. will stop his antibiotics on 10-07-09 and have written an order to have his PIC pulled that same day. he will return to clinic as needed   Orders: Est. Patient Level IV (62694)  Medications Added to Medication List This Visit: 1)  Prednisone 5 Mg Tabs (Prednisone) .... Take 1 tablet by mouth once a day 2)  Neurontin 300 Mg Caps (Gabapentin) .... Take one capsule by mouth every 8 hours 3)  Dilaudid 2 Mg Tabs  (Hydromorphone hcl) .... Take one tablet by mouth every 4 hours

## 2010-05-08 NOTE — Miscellaneous (Signed)
Summary: [facility]: Home Health Cert. & Plan Of Care  [facility]: Home Health Cert. & Plan Of Care   Imported By: Florinda Marker 09/26/2009 16:30:28  _____________________________________________________________________  External Attachment:    Type:   Image     Comment:   External Document

## 2010-05-08 NOTE — Miscellaneous (Signed)
Summary: [facility]: Orders  [facility]: Orders   Imported By: Florinda Marker 10/20/2009 16:01:45  _____________________________________________________________________  External Attachment:    Type:   Image     Comment:   External Document

## 2010-05-08 NOTE — Letter (Signed)
[facility]   Imported By: Lennie Odor 11/09/2009 11:39:52  _____________________________________________________________________  External Attachment:    Type:   Image     Comment:   External Document

## 2010-05-08 NOTE — Letter (Signed)
Summary: [facility]  [facility]   Imported By: Lester  03/16/2010 09:39:24  _____________________________________________________________________  External Attachment:    Type:   Image     Comment:   External Document

## 2010-05-08 NOTE — Assessment & Plan Note (Signed)
Summary: cough/ congested/ mbw   Primary Provider/Referring Provider:  Estill Bakes  CC:  Acute Visit.  c/o cough with green mucus, chest congestion, rattling in chest, and sharp pain in right side x72mo.  denies SOB, chest tightness, and fever.Marland Kitchen  History of Present Illness:  01/21/08- Wife w/ him. Now breathing, nose and sinuses comfortable, feel normal. Denies cough, phlegm, chest pain, palpitation. Facing surgery left hip. Uses CPAP 12, no problem. Remains on maint pred fro psoriatic arthritis. Pneumovax in hosp 2009. We had scheduled PFT previously but I don't find that it was ever done.  07/19/08- allergic rhinitis,  COPD, OSA , Psoriatic arthritis Had left hip replaced, no problem. Will need to have bolts replaced in spine. No respiratory complications with anesthesia surgery. Tolerating spring pollen season withut cough or wheeze.  April 14, 2009- Allergic rhinitis, COPD, OSA..................................Marland Kitchenwife here Pain still after Dr Channing Mutters did lumbar and thoracic spine surgery in October- low right post flank. No rash. Has been coughing with chest congestion x past month. Grey/ yellow non blooody sputum. Says Fentanyl hasn't helped. Sleeps on recliner. Not dyspneic. Has had shingles twice. Remains compliant eith CPAP,    Current Medications (verified): 1)  Synthroid 150 Mcg  Tabs (Levothyroxine Sodium) .... One Daily 2)  Prednisone 5 Mg  Tabs (Prednisone) .... 2 Daily 3)  Arava 20 Mg  Tabs (Leflunomide) .... One Daily 4)  Endocet 10-650 Mg  Tabs (Oxycodone-Acetaminophen) .... One Every 8 Hours As Needed 5)  Cyclobenzaprine Hcl 10 Mg  Tabs (Cyclobenzaprine Hcl) .... One Every 8 Hours As Needed 6)  Calcium 600 600 Mg  Tabs (Calcium Carbonate) .... One Three Times A Day 7)  Vitamin D 400 Unit Caps (Cholecalciferol) .... Once Daily 8)  Temazepam 15 Mg  Caps (Temazepam) .... One or Two At Bedtime 9)  Diovan 320 Mg  Tabs (Valsartan) .... Take 1 Tablet By Mouth Once A Day 10)   Furosemide 40 Mg  Tabs (Furosemide) .... One Once Daily 11)  Carvedilol 25 Mg Tabs (Carvedilol) .... Take 1 By Mouth Two Times A Day 12)  Cpap 12 13)  Symbicort 160-4.5 Mcg/act Aero (Budesonide-Formoterol Fumarate) .... I Puf Two Times A Day 14)  Vitamin C 500 Mg Chew (Ascorbic Acid) .... Take 1 By Mouth Once Daily 15)  Ferrous Sulfate 325 (65 Fe) Mg Tabs (Ferrous Sulfate) .... Once Daily  Allergies (verified): 1)  ! Augmentin  Past History:  Family History: Last updated: 2008/09/22 Father- died MVA Mother- died CAD, MRSA pneumonia, CHF  Social History: Last updated: 09-22-08  The patient is married, retired, and disabled secondary   to his long back problems.  His wife is a retired Engineer, civil (consulting) who can assist   at discharge.  The patient has had poor mobility secondary to his   worsening hip problems in the left.   Risk Factors: Caffeine Use: 0 (03/17/2007) Exercise: yes (03/17/2007)  Risk Factors: Smoking Status: quit (07/21/2007)  Past Medical History: COPD GERD Psoriatic arthritis Hypertension Hypothyroidism- steroid maint. Nephrolithiasis, hx of Osteoarthritis Low back pain Cryptococcus Left arm 2008 MSSA toe 2009 Septic shock 2009 Shingles x 2  Past Surgical History: Surgery toe for staph inf 2009 Left hip replaced  Stabilizing screws in spine 2005 L3-5 Thoracic spine surgery 2010  Review of Systems      See HPI       The patient complains of prolonged cough.  The patient denies anorexia, fever, weight loss, weight gain, vision loss, decreased hearing, hoarseness, chest pain, syncope,  dyspnea on exertion, peripheral edema, headaches, hemoptysis, abdominal pain, and severe indigestion/heartburn.    Vital Signs:  Patient profile:   65 year old male Height:      67 inches Weight:      198.13 pounds BMI:     31.14 O2 Sat:      92 % on Room air Pulse rate:   84 / minute BP sitting:   120 / 68  (right arm) Cuff size:   regular  Vitals Entered By: Gweneth Dimitri RN (April 14, 2009 9:22 AM)  O2 Flow:  Room air CC: Acute Visit.  c/o cough with green mucus, chest congestion, rattling in chest, and sharp pain in right side x28mo.  denies SOB, chest tightness, fever. Comments Medications reviewed with patient Gweneth Dimitri RN  April 14, 2009 9:22 AM    Physical Exam  Additional Exam:  General: A/Ox3; pleasant and cooperative, NAD, SKIN: no rash, lesions- no obvious shingles NODES: no lymphadenopathy HEENT: [place]/AT, EOM- WNL, Conjuctivae- clear, PERRLA, TM-WNL, Nose- clear, Throat- clear and wnl NECK: Supple w/ fair ROM, JVD- none, normal carotid impulses w/o bruits Thyroid- CHEST: Trace sqeaks, fair airflow, unlabored, no dullness or rub. HEART: RRR, no m/g/r heard ABDOMEN:  EXB:MWUX, nl pulses,  1+ edema left ankle, Homan's NEG , using a cane NEURO: Grossly intact to observation      Impression & Recommendations:  Problem # 1:  COPD (ICD-496) Acute bronchitis. We will give doxycycline and let him try some tramadol until he can get back to Dr Channing Mutters about pain management.  Problem # 2:  OBSTRUCTIVE SLEEP APNEA (ICD-327.23) Continues with cpap, compliant and effective.  Medications Added to Medication List This Visit: 1)  Vitamin D 400 Unit Caps (Cholecalciferol) .... Once daily 2)  Ferrous Sulfate 325 (65 Fe) Mg Tabs (Ferrous sulfate) .... Once daily 3)  Doxycycline Hyclate 100 Mg Tabs (Doxycycline hyclate) .... 2 today then one daily 4)  Tramadol Hcl 50 Mg Tabs (Tramadol hcl) .Marland Kitchen.. 1 every 6 hours as needed pain  Other Orders: Est. Patient Level III (32440)  Patient Instructions: 1)  keep Appointment 2)  Scripts for doxycycline  3)  script to try for tramadol for pain- discuss with Dr Channing Mutters if it helps Prescriptions: TRAMADOL HCL 50 MG TABS (TRAMADOL HCL) 1 every 6 hours as needed pain  #20 x 0   Entered and Authorized by:   Waymon Budge MD   Signed by:   Waymon Budge MD on 04/14/2009   Method used:   Print then Give to  Patient   RxID:   [id number] DOXYCYCLINE HYCLATE 100 MG TABS (DOXYCYCLINE HYCLATE) 2 today then one daily  #8 x 0   Entered and Authorized by:   Waymon Budge MD   Signed by:   Waymon Budge MD on 04/14/2009   Method used:   Print then Give to Patient   RxID:   [id number]    Immunization History:  Influenza Immunization History:    Influenza:  historical (01/06/2009)  Pneumovax Immunization History:    Pneumovax:  historical (10/07/2007)

## 2010-05-08 NOTE — Assessment & Plan Note (Signed)
Summary: 1 MTH NURSE VISIT PER CHECKOUT ON 04/17/09/TG  Nurse Visit   Vital Signs:  Patient profile:   65 year old male Height:      67 inches Weight:      193 pounds O2 Sat:      93 % on Room air Pulse rate:   93 / minute BP sitting:   151 / 85  (left arm)  Vitals Entered By: Teressa Lower RN (May 18, 2009 4:12 PM)  O2 Flow:  Room air   Preventive Screening-Counseling & Management  Comments: pt's blood pressure readings scanned into medical record  Current Medications (verified): 1)  Synthroid 150 Mcg  Tabs (Levothyroxine Sodium) .... One Daily 2)  Prednisone 5 Mg  Tabs (Prednisone) .... 2 Daily 3)  Arava 20 Mg  Tabs (Leflunomide) .... One Daily 4)  Endocet 10-650 Mg  Tabs (Oxycodone-Acetaminophen) .... One Every 8 Hours As Needed 5)  Cyclobenzaprine Hcl 10 Mg  Tabs (Cyclobenzaprine Hcl) .... One Every 8 Hours As Needed 6)  Calcium 600 600 Mg  Tabs (Calcium Carbonate) .... One Three Times A Day 7)  Vitamin D 400 Unit Caps (Cholecalciferol) .... Once Daily 8)  Temazepam 15 Mg  Caps (Temazepam) .... One or Two At Bedtime 9)  Furosemide 40 Mg  Tabs (Furosemide) .... One Once Daily 10)  Carvedilol 25 Mg Tabs (Carvedilol) .... Take 1 By Mouth Two Times A Day 11)  Cpap 12 12)  Symbicort 160-4.5 Mcg/act Aero (Budesonide-Formoterol Fumarate) .... I Puf Two Times A Day 13)  Vitamin C 500 Mg Chew (Ascorbic Acid) .... Take 1 By Mouth Once Daily 14)  Ferrous Sulfate 325 (65 Fe) Mg Tabs (Ferrous Sulfate) .... Once Daily 15)  Tramadol Hcl 50 Mg Tabs (Tramadol Hcl) .Marland Kitchen.. 1 Every 6 Hours As Needed Pain 16)  Cozaar 100 Mg Tabs (Losartan Potassium) .... Take 1 Tablet By Mouth Once A Day 17)  Methotrexate Sodium 25 Mg/ml Soln (Methotrexate Sodium) .... 0.4cc Subcutaneously X1 Weekly 18)  Duragesic-75 75 Mcg/hr Pt72 (Fentanyl) .... Change Topically Every 3 Days  Allergies (verified): 1)  ! Augmentin  Appended Document: 1 MTH NURSE VISIT PER CHECKOUT ON 04/17/09/TG  Reviewed Juanito Doom,  MD

## 2010-05-08 NOTE — Letter (Signed)
[facility]   Imported By: Sherian Rein 11/16/2009 14:23:14  _____________________________________________________________________  External Attachment:    Type:   Image     Comment:   External Document

## 2010-05-08 NOTE — Progress Notes (Signed)
Summary: [facility] labs  Phone Note Outgoing Call   Call placed by: Tomasita Morrow RN,  September 18, 2009 12:08 PM Summary of Call: Per Dr [name], call made to [facility] re: Lab dated 09-11-09.  Pt will need repeat hgb and hct. Mary(Pharmacist) at [facility] informed of the above. Tomasita Morrow RN  September 18, 2009 12:09 PM  Initial call taken by: Tomasita Morrow RN,  September 18, 2009 12:09 PM

## 2010-05-08 NOTE — Letter (Signed)
Summary: [facility]  [facility]   Imported By: Faythe Ghee 05/19/2009 15:21:00  _____________________________________________________________________  External Attachment:    Type:   Image     Comment:   External Document

## 2010-05-23 ENCOUNTER — Ambulatory Visit: Payer: Medicare Other | Admitting: Adult Health

## 2010-05-23 ENCOUNTER — Encounter: Payer: Self-pay | Admitting: Adult Health

## 2010-05-23 DIAGNOSIS — L02519 Cutaneous abscess of unspecified hand: Secondary | ICD-10-CM | POA: Insufficient documentation

## 2010-05-23 DIAGNOSIS — L03119 Cellulitis of unspecified part of limb: Secondary | ICD-10-CM

## 2010-05-24 ENCOUNTER — Other Ambulatory Visit: Payer: Self-pay | Admitting: Rheumatology

## 2010-05-24 DIAGNOSIS — R609 Edema, unspecified: Secondary | ICD-10-CM

## 2010-05-24 DIAGNOSIS — M869 Osteomyelitis, unspecified: Secondary | ICD-10-CM

## 2010-05-24 DIAGNOSIS — R52 Pain, unspecified: Secondary | ICD-10-CM

## 2010-05-25 ENCOUNTER — Ambulatory Visit
Admission: RE | Admit: 2010-05-25 | Discharge: 2010-05-25 | Disposition: A | Discharge status: Home / Self Care | Payer: Medicare Other | Source: Ambulatory Visit | Attending: Rheumatology | Admitting: Rheumatology

## 2010-05-25 DIAGNOSIS — R609 Edema, unspecified: Secondary | ICD-10-CM

## 2010-05-25 DIAGNOSIS — M869 Osteomyelitis, unspecified: Secondary | ICD-10-CM

## 2010-05-25 DIAGNOSIS — R52 Pain, unspecified: Secondary | ICD-10-CM

## 2010-05-25 MED ORDER — GADOBENATE DIMEGLUMINE 529 MG/ML IV SOLN
10.0000 mL | Freq: Once | INTRAVENOUS | Status: AC | PRN
  Administered 2010-05-25: 10 mL via INTRAVENOUS

## 2010-05-28 ENCOUNTER — Encounter: Payer: Self-pay | Admitting: Adult Health

## 2010-05-28 ENCOUNTER — Ambulatory Visit: Payer: Medicare Other | Admitting: Adult Health

## 2010-06-19 ENCOUNTER — Telehealth: Payer: Self-pay | Admitting: Infectious Diseases

## 2010-06-20 ENCOUNTER — Encounter: Payer: Self-pay | Admitting: Cardiology

## 2010-06-20 ENCOUNTER — Ambulatory Visit (INDEPENDENT_AMBULATORY_CARE_PROVIDER_SITE_OTHER): Payer: Medicare Other | Admitting: Cardiology

## 2010-06-20 DIAGNOSIS — I10 Essential (primary) hypertension: Secondary | ICD-10-CM

## 2010-06-20 DIAGNOSIS — R609 Edema, unspecified: Secondary | ICD-10-CM

## 2010-06-20 LAB — BONE MARROW EXAM

## 2010-06-20 LAB — CHROMOSOME ANALYSIS, BONE MARROW

## 2010-06-22 LAB — GLUCOSE, CAPILLARY
Glucose-Capillary: 196 mg/dL — ABNORMAL HIGH (ref 70–99)
Glucose-Capillary: 214 mg/dL — ABNORMAL HIGH (ref 70–99)
Glucose-Capillary: 221 mg/dL — ABNORMAL HIGH (ref 70–99)
Glucose-Capillary: 235 mg/dL — ABNORMAL HIGH (ref 70–99)
Glucose-Capillary: 98 mg/dL (ref 70–99)

## 2010-06-22 LAB — HEPATIC FUNCTION PANEL
ALT: 16 U/L (ref 0–53)
AST: 23 U/L (ref 0–37)
Total Protein: 6.3 g/dL (ref 6.0–8.3)

## 2010-06-22 LAB — CROSSMATCH

## 2010-06-23 LAB — COMPREHENSIVE METABOLIC PANEL
ALT: 23 U/L (ref 0–53)
AST: 33 U/L (ref 0–37)
Albumin: 3.2 g/dL — ABNORMAL LOW (ref 3.5–5.2)
Alkaline Phosphatase: 176 U/L — ABNORMAL HIGH (ref 39–117)
BUN: 14 mg/dL (ref 6–23)
Chloride: 98 mEq/L (ref 96–112)
GFR calc Af Amer: >60 mL/min (ref >60)
Potassium: 4.5 mEq/L (ref 3.5–5.1)
Sodium: 143 mEq/L (ref 135–145)
Total Bilirubin: 0.9 mg/dL (ref 0.3–1.2)

## 2010-06-23 LAB — CBC
MCV: 99.3 fL (ref 78.0–100.0)
Platelets: 316 10*3/uL (ref 150–400)
RBC: 2.58 MIL/uL — ABNORMAL LOW (ref 4.22–5.81)
WBC: 7.4 10*3/uL (ref 4.0–10.5)

## 2010-06-23 LAB — URINALYSIS, ROUTINE W REFLEX MICROSCOPIC
Bilirubin Urine: NEGATIVE
Glucose, UA: NEGATIVE mg/dL
Ketones, ur: NEGATIVE mg/dL
Specific Gravity, Urine: 1.025 (ref 1.005–1.030)
pH: 7.5 (ref 5.0–8.0)

## 2010-06-23 LAB — DIFFERENTIAL
Basophils Absolute: 0 10*3/uL (ref 0.0–0.1)
Basophils Relative: 0 % (ref 0–1)
Eosinophils Absolute: 0.1 10*3/uL (ref 0.0–0.7)
Eosinophils Relative: 2 % (ref 0–5)
Monocytes Absolute: 0.1 10*3/uL (ref 0.1–1.0)

## 2010-06-23 LAB — SURGICAL PCR SCREEN
MRSA, PCR: NEGATIVE
Staphylococcus aureus: POSITIVE — AB

## 2010-06-23 LAB — URINE MICROSCOPIC-ADD ON

## 2010-06-25 LAB — CBC
HCT: 25.3 % — ABNORMAL LOW (ref 39.0–52.0)
HCT: 26.3 % — ABNORMAL LOW (ref 39.0–52.0)
Hemoglobin: 8.4 g/dL — ABNORMAL LOW (ref 13.0–17.0)
Hemoglobin: 8.7 g/dL — ABNORMAL LOW (ref 13.0–17.0)
Hemoglobin: 8.7 g/dL — ABNORMAL LOW (ref 13.0–17.0)
MCHC: 33.5 g/dL (ref 30.0–36.0)
MCV: 95.1 fL (ref 78.0–100.0)
Platelets: 245 10*3/uL (ref 150–400)
Platelets: 246 10*3/uL (ref 150–400)
Platelets: 322 10*3/uL (ref 150–400)
Platelets: 323 10*3/uL (ref 150–400)
RDW: 21.3 % — ABNORMAL HIGH (ref 11.5–15.5)
RDW: 20.1 % — ABNORMAL HIGH (ref 11.5–15.5)
RDW: 20.5 % — ABNORMAL HIGH (ref 11.5–15.5)
RDW: 21.3 % — ABNORMAL HIGH (ref 11.5–15.5)
WBC: 3.8 10*3/uL — ABNORMAL LOW (ref 4.0–10.5)
WBC: 4.7 10*3/uL (ref 4.0–10.5)
WBC: 5.9 10*3/uL (ref 4.0–10.5)

## 2010-06-25 LAB — BASIC METABOLIC PANEL
BUN: 15 mg/dL (ref 6–23)
BUN: 13 mg/dL (ref 6–23)
Calcium: 8.2 mg/dL — ABNORMAL LOW (ref 8.4–10.5)
Calcium: 8.6 mg/dL (ref 8.4–10.5)
GFR calc non Af Amer: >60 mL/min (ref >60)
GFR calc non Af Amer: >60 mL/min (ref >60)
Glucose, Bld: 125 mg/dL — ABNORMAL HIGH (ref 70–99)
Potassium: 3.8 mEq/L (ref 3.5–5.1)
Potassium: 4.1 mEq/L (ref 3.5–5.1)
Sodium: 141 mEq/L (ref 135–145)
Sodium: 141 mEq/L (ref 135–145)

## 2010-06-25 LAB — COMPREHENSIVE METABOLIC PANEL
ALT: 30 U/L (ref 0–53)
AST: 26 U/L (ref 0–37)
AST: 35 U/L (ref 0–37)
Albumin: 2.5 g/dL — ABNORMAL LOW (ref 3.5–5.2)
Alkaline Phosphatase: 124 U/L — ABNORMAL HIGH (ref 39–117)
CO2: 28 mEq/L (ref 19–32)
Calcium: 8.9 mg/dL (ref 8.4–10.5)
Chloride: 104 mEq/L (ref 96–112)
Creatinine, Ser: 1 mg/dL (ref 0.4–1.5)
GFR calc Af Amer: >60 mL/min (ref >60)
GFR calc Af Amer: >60 mL/min (ref >60)
GFR calc non Af Amer: >60 mL/min (ref >60)
Glucose, Bld: 242 mg/dL — ABNORMAL HIGH (ref 70–99)
Potassium: 4.1 mEq/L (ref 3.5–5.1)
Sodium: 139 mEq/L (ref 135–145)
Sodium: 141 mEq/L (ref 135–145)
Total Bilirubin: 0.2 mg/dL — ABNORMAL LOW (ref 0.3–1.2)
Total Protein: 5.8 g/dL — ABNORMAL LOW (ref 6.0–8.3)
Total Protein: 6.2 g/dL (ref 6.0–8.3)

## 2010-06-25 LAB — GLUCOSE, CAPILLARY
Glucose-Capillary: 111 mg/dL — ABNORMAL HIGH (ref 70–99)
Glucose-Capillary: 125 mg/dL — ABNORMAL HIGH (ref 70–99)
Glucose-Capillary: 132 mg/dL — ABNORMAL HIGH (ref 70–99)
Glucose-Capillary: 133 mg/dL — ABNORMAL HIGH (ref 70–99)
Glucose-Capillary: 145 mg/dL — ABNORMAL HIGH (ref 70–99)
Glucose-Capillary: 153 mg/dL — ABNORMAL HIGH (ref 70–99)
Glucose-Capillary: 162 mg/dL — ABNORMAL HIGH (ref 70–99)
Glucose-Capillary: 166 mg/dL — ABNORMAL HIGH (ref 70–99)
Glucose-Capillary: 167 mg/dL — ABNORMAL HIGH (ref 70–99)
Glucose-Capillary: 205 mg/dL — ABNORMAL HIGH (ref 70–99)
Glucose-Capillary: 109 mg/dL — ABNORMAL HIGH (ref 70–99)
Glucose-Capillary: 113 mg/dL — ABNORMAL HIGH (ref 70–99)
Glucose-Capillary: 131 mg/dL — ABNORMAL HIGH (ref 70–99)
Glucose-Capillary: 138 mg/dL — ABNORMAL HIGH (ref 70–99)

## 2010-06-25 LAB — SEDIMENTATION RATE: Sed Rate: 119 mm/hr — ABNORMAL HIGH (ref 0–16)

## 2010-06-25 LAB — DIFFERENTIAL
Blasts: 0 %
Eosinophils Absolute: 0 10*3/uL (ref 0.0–0.7)
Lymphocytes Relative: 12 % (ref 12–46)
Lymphocytes Relative: 8 % — ABNORMAL LOW (ref 12–46)
Lymphs Abs: 0.8 10*3/uL (ref 0.7–4.0)
Lymphs Abs: 0.5 10*3/uL — ABNORMAL LOW (ref 0.7–4.0)
Monocytes Relative: 1 % — ABNORMAL LOW (ref 3–12)
Myelocytes: 0 %
Neutro Abs: 5.2 10*3/uL (ref 1.7–7.7)
Neutrophils Relative %: 78 % — ABNORMAL HIGH (ref 43–77)
Neutrophils Relative %: 90 % — ABNORMAL HIGH (ref 43–77)
Promyelocytes Absolute: 0 %
nRBC: 0 /100 WBC

## 2010-06-25 LAB — WOUND CULTURE

## 2010-06-25 LAB — LIPID PANEL
HDL: 44 mg/dL (ref >39)
Total CHOL/HDL Ratio: 3.1 RATIO
VLDL: 32 mg/dL (ref 0–40)

## 2010-06-25 LAB — MAGNESIUM: Magnesium: 2.1 mg/dL (ref 1.5–2.5)

## 2010-06-25 LAB — HEMOGLOBIN A1C: Mean Plasma Glucose: 206 mg/dL — ABNORMAL HIGH (ref <117)

## 2010-06-26 LAB — CBC
Platelets: 306 10*3/uL (ref 150–400)
RBC: 3.45 MIL/uL — ABNORMAL LOW (ref 4.22–5.81)
WBC: 5.7 10*3/uL (ref 4.0–10.5)

## 2010-06-26 LAB — PROTIME-INR
INR: 1.03 (ref 0.00–1.49)
Prothrombin Time: 13.4 seconds (ref 11.6–15.2)

## 2010-06-26 LAB — APTT: aPTT: 29 seconds (ref 24–37)

## 2010-06-26 LAB — GLUCOSE, CAPILLARY: Glucose-Capillary: 167 mg/dL — ABNORMAL HIGH (ref 70–99)

## 2010-06-26 NOTE — Assessment & Plan Note (Signed)
Summary: f/u monday [mkj]   Vital Signs:  Patient profile:   65 year old male Height:      67 inches Weight:      160.4 pounds BMI:     25.21 Temp:     98.9 degrees F oral Pulse rate:   77 / minute BP sitting:   128 / 73  (right arm)  Vitals Entered By: Alesia Morin CMA (May 28, 2010 10:53 AM) CC: follow-up visit about swollen finger Is Patient Diabetic? No Pain Assessment Patient in pain? no      Nutritional Status BMI of 19 -24 = normal Nutritional Status Detail appetite "ok"  Have you ever been in a relationship where you felt threatened, hurt or afraid?No   Does patient need assistance? Functional Status Self care Ambulation Normal   Primary Provider:  Georgianne Fick  CC:  follow-up visit about swollen finger.  History of Present Illness: Returned for f/u appt. to check cellulitis of right hand.  Doing much better while on doxycycline.  Pain, swelling, redness and tenderness cleared.  Is able to fully flext right index finger.  Preventive Screening-Counseling & Management  Alcohol-Tobacco     Alcohol drinks/day: 0     Smoking Status: quit     Year Quit: 20 yrs ago  Caffeine-Diet-Exercise     Caffeine use/day: 0     Does Patient Exercise: no     Type of exercise: walking     Times/week: 7  Safety-Violence-Falls     Seat Belt Use: yes      Sexual History:  currently monogamous.        Drug Use:  never.        Blood Transfusions:  no.    Allergies: 1)  ! Augmentin  Social History: Sexual History:  currently monogamous Drug Use:  never Blood Transfusions:  no  Physical Exam  Msk:  Selling rightn index finger clear, no erythema noted, full flexion and extension ROM to right index finger.  No point tnderness over PIP or proximal phalynx   Impression & Recommendations:  Problem # 1:  CELLULITIS, HAND, RIGHT (ICD-682.4) Assessment Improved Complwete doxycycline, f/u with PMD abnd RTC as needed. His updated medication list for this  problem includes:    Endocet 10-650 Mg Tabs (Oxycodone-acetaminophen) ..... One every 8 hours as needed    Duragesic-100 100 Mcg/hr Pt72 (Fentanyl) .Marland Kitchen... 1 patch every 3 days    Dilaudid 2 Mg Tabs (Hydromorphone hcl) .Marland Kitchen... Take one tablet by mouth every 4 hours    Doxycycline Hyclate 100 Mg Caps (Doxycycline hyclate) .Marland Kitchen... 1 capsule by mouth every 12 hours for 10 days

## 2010-06-26 NOTE — Assessment & Plan Note (Signed)
Summary: per ckout sf, per pt call tmj/ths      Allergies Added:   Visit Type:  Follow-up Primary Provider:  Georgianne Fick  CC:  no cardiology complaints.  History of Present Illness: Mr Johnny Patrick returns for E and M of his HTN and edema. He has lost a significant amount of weight since his back surgery. He is more mobile and can now elevate his legs. His BP has been good, last check 120/80. He did not take his Lasix this AM. His edema has been stable or improved.  Current Medications (verified): 1)  Synthroid 150 Mcg  Tabs (Levothyroxine Sodium) .... One Daily 2)  Prednisone 5 Mg  Tabs (Prednisone) .... Take 1 Tablet By Mouth Once A Day 3)  Arava 20 Mg  Tabs (Leflunomide) .... One Daily 4)  Endocet 10-650 Mg  Tabs (Oxycodone-Acetaminophen) .... One Every 8 Hours As Needed 5)  Cyclobenzaprine Hcl 10 Mg  Tabs (Cyclobenzaprine Hcl) .... One Every 8 Hours As Needed 6)  Calcium 600 600 Mg  Tabs (Calcium Carbonate) .... One Three Times A Day 7)  Vitamin D 400 Unit Caps (Cholecalciferol) .... Once Daily 8)  Temazepam 15 Mg  Caps (Temazepam) .... One or Two At Bedtime 9)  Furosemide 40 Mg  Tabs (Furosemide) .... One Once Daily 10)  Carvedilol 25 Mg Tabs (Carvedilol) .... Take 1 By Mouth Two Times A Day 11)  Cpap 12 .... [facility] 12)  Symbicort 160-4.5 Mcg/act Aero (Budesonide-Formoterol Fumarate) .... I Puf Two Times A Day 13)  Vitamin C 500 Mg Chew (Ascorbic Acid) .... Take 1 By Mouth Once Daily 14)  Cozaar 100 Mg Tabs (Losartan Potassium) .... Take 1 Tablet By Mouth Once A Day 15)  Duragesic-100 100 Mcg/hr Pt72 (Fentanyl) .Marland Kitchen.. 1 Patch Every 3 Days 16)  Klor-Con M20 20 Meq Cr-Tabs (Potassium Chloride Crys Cr) .... Take 1 Tablet By Mouth Once A Day Per Pcp 17)  Fish Oil 1000 Mg Caps (Omega-3 Fatty Acids) .... Three Times A Day 18)  Neurontin 300 Mg Caps (Gabapentin) .... Take One Capsule By Mouth Every 8 Hours 19)  Dilaudid 2 Mg Tabs (Hydromorphone Hcl) .... Take One Tablet By  Mouth Every 4 Hours 20)  Folic Acid 1 Mg Tabs (Folic Acid) .Marland Kitchen.. 1 Tab Once Daily 21)  Humira 20 Mg/0.15ml Kit (Adalimumab) .... Take Once Week 22)  Doxycycline Hyclate 100 Mg Caps (Doxycycline Hyclate) .Marland Kitchen.. 1 Capsule By Mouth Every 12 Hours For 10 Days  Allergies (verified): 1)  ! Augmentin  Past History:  Past Medical History: Last updated: 104-12-202011 COPD GERD Psoriatic arthritis Hypertension Hypothyroidism- steroid maint. 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  His wife is a retired  nurse who can assist   at discharge.  The patient has had poor mobility secondary to his   worsening hip problems in the left.   Risk Factors: Alcohol Use: 0 (12020/06/709) Caffeine Use: 0 (10/04/2009) Exercise: no (10/04/2009)  Risk Factors: Smoking Status: quit (12020/06/709)   Review of Systems       Negative other than HPI  Vital Signs:  Patient profile:   65 year old male Weight:      162 pounds BMI:     25.46 Pulse rate:   82 / minute BP sitting:   156 / 84  (left arm)  Vitals Entered By: Dreama Saa, CNA (June 20, 2010 3:51 PM)  Physical Exam  General:  NAD, walking  with a cane. Head:  normocephalic and atraumatic Neck:  Neck supple, no JVD. No masses, thyromegaly or abnormal cervical nodes. Chest Saher Davee:  no deformities or breast masses noted Lungs:  Clear bilaterally to auscultation and percussion. Heart:  RRR, Nl S1 and S2, No gallop, no carotid bruits Msk:  decreased ROM.   Pulses:  pulses normal in all 4 extremities Extremities:  1+ left pedal edema and 1+ right pedal edema.   Neurologic:  Alert and oriented x 3. Skin:  Intact without lesions or rashes. Psych:  Normal affect.   Impression & Recommendations:  Problem # 1:  EDEMA (ICD-782.3) Assessment Improved  Problem # 2:  HYPERTENSION (ICD-401.9) Assessment: Improved  His updated medication list for this problem includes:    Furosemide 40 Mg Tabs (Furosemide) ..... One once daily    Carvedilol 25 Mg Tabs (Carvedilol) .Marland Kitchen... Take 1 by mouth two times a day    Cozaar 100 Mg Tabs (Losartan potassium) .Marland Kitchen... Take 1 tablet by mouth once a day  Patient Instructions: 1)  Your physician recommends that you schedule a follow-up appointment in: 6 months 2)  Your physician recommends that you continue on your current medications as directed. Please refer to the Current Medication list given to you today.

## 2010-06-26 NOTE — Assessment & Plan Note (Signed)
Summary: new to np per tamika swollen finger[mkj]   Vital Signs:  Patient profile:   65 year old male Height:      67 inches Weight:      157 pounds BMI:     24.68 Temp:     98 degrees F oral Pulse rate:   97 / minute BP sitting:   170 / 71  (right arm)  Vitals Entered By: Alesia Morin CMA (May 23, 2010 3:56 PM) CC: Pt in today due to swollen pointer finger and tender wrist on the R hand. Going on for about a month, there was a cut there that has healed but he thinks that the inside was still infected and the outside healed.  Is Patient Diabetic? Yes Did you bring your meter with you today? No Pain Assessment Patient in pain? no     Location: hand Nutritional Status BMI of 19 -24 = normal Nutritional Status Detail appetite "good"  Have you ever been in a relationship where you felt threatened, hurt or afraid?No   Does patient need assistance? Functional Status Self care Ambulation Normal Comments no missed meds.Patient not sure if his hand hurts because he has a pain patch on and it works well.   Primary Provider:  Georgianne Fick  CC:  Pt in today due to swollen pointer finger and tender wrist on the R hand. Going on for about a month and there was a cut there that has healed but he thinks that the inside was still infected and the outside healed. Marland Kitchen  History of Present Illness: New onset swellingm, tenderness, painful articulation R index fuinger.  Past h/o cellulits requiring IV antibiotics for treatment, ?osteomyelitis.  Voicing convern of same this time.  Denies fever, chills, sweats, or fatigue.  On Humira therapy.  Preventive Screening-Counseling & Management  Alcohol-Tobacco     Alcohol drinks/day: 0     Smoking Status: quit     Year Quit: 20 yrs ago  Caffeine-Diet-Exercise     Caffeine use/day: 0     Does Patient Exercise: no     Type of exercise: walking     Times/week: 7  Safety-Violence-Falls     Seat Belt Use: yes      Sexual History:   currently monogamous.        Drug Use:  never.    Comments: no to condoms  Allergies: 1)  ! Augmentin  Social History: Sexual History:  currently monogamous Drug Use:  never  Review of Systems       The patient complains of decreased hearing.  The patient denies anorexia, fever, weight loss, weight gain, vision loss, hoarseness, chest pain, syncope, dyspnea on exertion, peripheral edema, prolonged cough, headaches, hemoptysis, abdominal pain, melena, hematochezia, severe indigestion/heartburn, hematuria, incontinence, genital sores, muscle weakness, suspicious skin lesions, transient blindness, difficulty walking, depression, unusual weight change, abnormal bleeding, angioedema, and testicular masses.         See HPI  Physical Exam  General:  well-developed, well-nourished, and well-hydrated.   Neck:  no masses.   Lungs:  normal respiratory effort and normal breath sounds.   Heart:  normal rate, regular rhythm, and no murmur.   Msk:  Swelling , erythema  and point tnderness along proximal phalynx right hand and the index PIP joint with limited ROM noted.  No open lesions or draining wounds.   Impression & Recommendations:  Problem # 1:  CELLULITIS, HAND, RIGHT (ICD-682.4) Scheduled for MRI of right hand this week.  Will treat  with course of Doxyxycline and have him RTC on Monday to re-evaluate.  If symptoms persist may need IV antibiotics.  Will pend results of MRI if necessary. His updated medication list for this problem includes:    Endocet 10-650 Mg Tabs (Oxycodone-acetaminophen) ..... One every 8 hours as needed    Duragesic-100 100 Mcg/hr Pt72 (Fentanyl) .Marland Kitchen... 1 patch every 3 days    Dilaudid 2 Mg Tabs (Hydromorphone hcl) .Marland Kitchen... Take one tablet by mouth every 4 hours    Doxycycline Hyclate 100 Mg Caps (Doxycycline hyclate) .Marland Kitchen... 1 capsule by mouth every 12 hours for 10 days  Orders: MRI with & without Contrast (MRI w&w/o Contrast)  Medications Added to Medication List  This Visit: 1)  Doxycycline Hyclate 100 Mg Caps (Doxycycline hyclate) .Marland Kitchen.. 1 capsule by mouth every 12 hours for 10 days Prescriptions: DOXYCYCLINE HYCLATE 100 MG CAPS (DOXYCYCLINE HYCLATE) 1 capsule by mouth every 12 hours for 10 days  #20 x 0   Entered and Authorized by:   Talmadge Chad NP   Signed by:   Talmadge Chad NP on 05/23/2010   Method used:   Print then Give to Patient   RxID:   [id number]    Orders Added: 1)  MRI with & without Contrast [MRI w&w/o Contrast]

## 2010-07-05 NOTE — Progress Notes (Signed)
   Phone Note Call from Patient   Caller: Spouse Summary of Call: spouse called to ask for an appt today. states her spouse's r index finger got worse overnight. red & swollen. states he has ha hx of sepsis & mrsa. told her we have no appts for the next 2 days & I advised going to ED. (he has finsihed antibiotics). states he will probably refuse this. she will call for an appt here when we have one. again, advised that he be seen asap at ED Initial call taken by: Golden Circle RN,  June 19, 2010 11:45 AM  Follow-up for Phone Call        spouse lm on our phones that pt finsished the 10 days of doxy, his arthritis md called in another 10 days. it flared again & pcp called in bactrim ds. I called back & LM that I am sending this info to md. to call back & make f/u appt.  Follow-up by: Golden Circle RN,  June 25, 2010 4:22 PM

## 2010-07-07 ENCOUNTER — Encounter: Payer: Self-pay | Admitting: Infectious Diseases

## 2010-07-11 ENCOUNTER — Encounter: Payer: Self-pay | Admitting: Infectious Diseases

## 2010-07-11 ENCOUNTER — Ambulatory Visit (INDEPENDENT_AMBULATORY_CARE_PROVIDER_SITE_OTHER): Payer: Medicare Other | Admitting: Infectious Diseases

## 2010-07-11 VITALS — BP 159/82 | HR 64 | Temp 98.0°F | Ht 66.0 in | Wt 156.3 lb

## 2010-07-11 DIAGNOSIS — L02519 Cutaneous abscess of unspecified hand: Secondary | ICD-10-CM

## 2010-07-11 DIAGNOSIS — L03119 Cellulitis of unspecified part of limb: Secondary | ICD-10-CM

## 2010-07-11 MED ORDER — SULFAMETHOXAZOLE-TRIMETHOPRIM 800-160 MG PO TABS: 1.0000 | ORAL_TABLET | Freq: Two times a day (BID) | ORAL | Status: DC

## 2010-07-11 NOTE — Assessment & Plan Note (Signed)
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 Gave him options (prolonged po vs IV) and he would like to try po for a month before going back to IV. He has had 3 previous pics. He is written a rx for bactrim ds for 30 days.

## 2010-07-11 NOTE — Progress Notes (Signed)
  Subjective:    Patient ID: Johnny Patrick, male    DOB: 12/12/1945, 65 y.o.   MRN: [id number]  HPI 65 yo m with hx of DM2, psoriatic arthritis (prev on methotrexate and prednisone), prev R foot MSSA infection with sepsis 2009. He subsequently developed infection in his L second toe (MRI no osteo)  And her underwent amputation 09-08-09. Cx's MSSA.  He was then seen in clinic Feb 2012 with cellulits of his R hand after a presumed cut to his hand. He completed a course of doxy for this.MRI showed no osteo, abscess or myofasciitis. By 3-13 he had worsening of swelling, erythema in his hand. He was rec to go to the ED which he refused. He got another 20 days of doxy after this then 10 days of bactrim (completed 07-02-10).  He states today that his hand "just ain't well". No increase in heat in his hand. Fells like swelling is worse in his hand. Has pain with bending his finger.  FSGs have been "good". No fever or chills. Has had some peeling of his skin on his hand.    Review of Systems     Objective:   Physical Exam  Constitutional: He appears well-developed and well-nourished.  Eyes: EOM are normal.  Musculoskeletal:       Arms: Lymphadenopathy:       Right axillary: No pectoral and no lateral adenopathy present.       Right: No epitrochlear adenopathy present.          Assessment & Plan:

## 2010-07-12 LAB — COMPREHENSIVE METABOLIC PANEL
AST: 25 U/L (ref 0–37)
Albumin: 4.2 g/dL (ref 3.5–5.2)
Chloride: 102 mEq/L (ref 96–112)
Creatinine, Ser: 0.87 mg/dL (ref 0.4–1.5)
GFR calc Af Amer: >60 mL/min (ref >60)
Potassium: 5.2 mEq/L — ABNORMAL HIGH (ref 3.5–5.1)
Total Bilirubin: 0.7 mg/dL (ref 0.3–1.2)

## 2010-07-12 LAB — URINALYSIS, ROUTINE W REFLEX MICROSCOPIC
Glucose, UA: 100 mg/dL — AB
Ketones, ur: NEGATIVE mg/dL
Specific Gravity, Urine: 1.028 (ref 1.005–1.030)
pH: 6.5 (ref 5.0–8.0)

## 2010-07-12 LAB — TYPE AND SCREEN
ABO/RH(D): O POS
Antibody Screen: NEGATIVE

## 2010-07-12 LAB — DIFFERENTIAL
Basophils Absolute: 0 10*3/uL (ref 0.0–0.1)
Eosinophils Relative: 1 % (ref 0–5)
Lymphocytes Relative: 23 % (ref 12–46)
Monocytes Absolute: 0.4 10*3/uL (ref 0.1–1.0)

## 2010-07-12 LAB — CBC
MCV: 90.4 fL (ref 78.0–100.0)
Platelets: 200 10*3/uL (ref 150–400)
Platelets: 261 10*3/uL (ref 150–400)
RDW: 15.2 % (ref 11.5–15.5)
WBC: 6 10*3/uL (ref 4.0–10.5)

## 2010-07-12 LAB — MRSA PCR SCREENING: MRSA by PCR: NEGATIVE

## 2010-07-19 ENCOUNTER — Telehealth: Payer: Self-pay | Admitting: Licensed Clinical Social Worker

## 2010-07-19 NOTE — Telephone Encounter (Signed)
Patient called stating that he had an allergic reaction to the bactrim that he was taking, itchy rash, "funny feeling in throat'. He has stopped taking it and feels better, he wants to know if he can have something else called in.

## 2010-07-23 ENCOUNTER — Other Ambulatory Visit: Payer: Self-pay | Admitting: Licensed Clinical Social Worker

## 2010-07-23 DIAGNOSIS — L0390 Cellulitis, unspecified: Secondary | ICD-10-CM

## 2010-07-23 MED ORDER — DOXYCYCLINE HYCLATE 100 MG PO TABS: 100.0000 mg | ORAL_TABLET | Freq: Two times a day (BID) | ORAL | Status: AC

## 2010-07-29 ENCOUNTER — Other Ambulatory Visit: Payer: Self-pay | Admitting: Cardiology

## 2010-08-01 ENCOUNTER — Telehealth: Payer: Self-pay | Admitting: Internal Medicine

## 2010-08-01 NOTE — Telephone Encounter (Signed)
Pt requesting temazepam 15mg  rx.  Last OV [id number] Pending OV 08/23/10  Rx last given on 02/06/10 #60 x 5  Dr. Maple Hudson, pls advise if rx ok.  Thanks!

## 2010-08-02 MED ORDER — TEMAZEPAM 15 MG PO CAPS: ORAL_CAPSULE | ORAL | Status: DC

## 2010-08-14 ENCOUNTER — Encounter: Payer: Self-pay | Admitting: Infectious Diseases

## 2010-08-14 ENCOUNTER — Ambulatory Visit (INDEPENDENT_AMBULATORY_CARE_PROVIDER_SITE_OTHER): Payer: Medicare Other | Admitting: Infectious Diseases

## 2010-08-14 VITALS — BP 136/68 | HR 67 | Temp 98.6°F | Ht 66.0 in | Wt 162.0 lb

## 2010-08-14 DIAGNOSIS — L03119 Cellulitis of unspecified part of limb: Secondary | ICD-10-CM

## 2010-08-14 MED ORDER — DOXYCYCLINE HYCLATE 100 MG PO TABS: 100.0000 mg | ORAL_TABLET | Freq: Two times a day (BID) | ORAL | Status: DC

## 2010-08-14 NOTE — Assessment & Plan Note (Signed)
He appears to be doing well. Will plan on his being on the po anbx for 3 months and repeat his MRI of his wrist at that time. Would ask him to f/u with his hand surgeon.

## 2010-08-14 NOTE — Progress Notes (Signed)
  Subjective:    Patient ID: Johnny Patrick, male    DOB: Oct 18, 1945, 65 y.o.   MRN: [id number]  HPI 65 yo m with hx of DM2, psoriatic arthritis (prev on methotrexate and prednisone), prev R foot MSSA infection with sepsis 2009. He subsequently developed infection in his L second toe (MRI no osteo) And her underwent amputation 09-08-09. Cx's MSSA.  He was then seen in clinic Feb 2012 with cellulits of his R hand after a presumed cut to his hand. He completed a course of doxy for this.MRI showed no osteo, abscess or myofasciitis. By 3-13 he had worsening of swelling, erythema in his hand. He was rec to go to the ED which he refused. He got another 20 days of doxy after this then 10 days of bactrim (completed 07-02-10). Was seen in ID clinic 07-11-10 and was started on 30 more days of doxy (he was given choice of PIC or PO).  Today he feels that his hand is better. He has some erythema over his hand- attributes this to sunburn. No soreness, no fever or chills; strength in hand is good as is range of motion. Would like to know if he can restart his humira.     Review of Systems     Objective:   Physical Exam  Constitutional: He appears well-developed and well-nourished.  Musculoskeletal:       Arms:         Assessment & Plan:

## 2010-08-21 ENCOUNTER — Encounter: Payer: Self-pay | Admitting: Internal Medicine

## 2010-08-21 NOTE — H&P (Signed)
Johnny Patrick, Johnny Patrick                ACCOUNT NO.:  [id number]   MEDICAL RECORD NO.:  [id number]          PATIENT TYPE:  IPS   LOCATION:  4008                         FACILITY:  [facility]   PHYSICIAN:  Ranelle Oyster, M.D.DATE OF BIRTH:  1945-06-16   DATE OF ADMISSION:  11/09/2007  DATE OF DISCHARGE:                              HISTORY & PHYSICAL   CHIEF COMPLAINT:  Weakness, left hip pain, and confusion.   HISTORY OF PRESENT ILLNESS:  A 65 year old white male with history of  psoriatic arthritis and recurrent right foot infection, status post I  and D in June 2009.  He was admitted on October 26, 2007, with fall and  hypotension, as well as fever.  He also presented in a confused state.  The patient noted to be hypoxic with leukopenia and thrombocytopenia  with an abnormal LFTs and elevated d-dimer.  CT of the head was without  acute changes.  Workup is low probability for PE.  Dr. Cyndie Chime was  consulted __________ and felt pancytopenia is multifactorial and agree  with holding patient's Arava.  He required vent support for VDRF  secondary to septic shock, treated with multiple antibiotics.  ID was  consulted for input.  The CT of the hip was done showing several fluid  collections around the left hip and proximal femur.  This was aspirated  and cultures were negative.  The patient has had a long history of hip  pain and likely will need hip replacement in the near future.  The  patient had noted cardiac enzyme elevation, and Cardiology was  consulted.  GI followed the patient for abnormal LFTs and elevated  ammonia levels.  Hepatitis panel was negative.  Ultrasound was without  gallstones and normal appearance of liver.  The numbers are resolving.  The patient with diarrhea, secondary to antibiotics.  GI felt that  hepatic injury and encephalopathy was due to Augmentin perhaps.  The  patient was seen by Speech Language Pathology for significant dysphagia.  He had penetration of all  liquids, but he was able to clear nectar, and  therefore, he is on a nectar liquid only diet.  He is receiving multiple  supplements daily.  The patient continues to require significant  assistance with mobility and transfers.  Due to poor endurance in  strength, and thus was admitted at the rehab unit today after initial  consult screen by our team.   REVIEW OF SYSTEMS:  Notable for insomnia, some numbness along the right  third finger, low back pain, and rash.  He has a Foley catheter still  in.  He reports diarrhea at times.  Appetite has been fair.  Mood has  been good as he is improving.  Full reviews and the written H and P.   PAST MEDICAL HISTORY:  1. Positive for lumbar laminectomy in L3 and L5, left ankle fracture      and fusion in 1967.  2. Obstructive sleep apnea, on CPAP nightly, followed by Dr. Roxan Hockey.  3. Hypothyroidism.  4. Chronic lower extremity edema.  5. History of left hip avascular necrosis.  6.  History of right wrist cyst excised in 2008.  7. History of insomnia.  8. Psoriatic arthritis.  9. Chronic rhinitis.  10.Right foot osteo infection, status post I and D.  11.Renal calculi.  12.Hypertension.  13.Hiatal hernia.  14.Left forearm fungal infection, status post I and D.   FAMILY HISTORY:  Positive for CHF.   SOCIAL HISTORY:  The patient is married, retired, and disabled secondary  to his long back problems.  His wife is a retired Engineer, civil (consulting) who can assist  at discharge.  The patient has had poor mobility secondary to his  worsening hip problems in the left.   FUNCTIONAL HISTORY:  The patient is independent for household activities  prior to arrival, although impaired by his hip pain.  Currently, the  patient is requiring mod to max assist for basic mobility, self care,  and transfers, etc.   ALLERGIES:  AUGMENTIN.   MEDICATIONS AT HOME:  Vitamin D, calcium, multivitamin, Synthroid,  prednisone, Arava, Endocet, Diovan, furosemide, Fosamax, Flexeril,   Coreg, and Restoril.   PHYSICAL EXAMINATION:  VITAL SIGNS:  Blood pressure 131/80, pulse 82,  respiratory rate 20, and temperature 98.1.  GENERAL:  The patient is pleasant, sitting in his bed, appears alert and  oriented x3.  HEENT:  Sclera slightly icteric.  Pupils are equal, round, and reactive  to light.  Nose and throat exam was essentially unremarkable with fair  to borderline dentition.  He did have some thrush on the tongue.  NECK:  Supple without JVD or lymphadenopathy.  CHEST:  Notable for some high-pitched rhonchi, more upper airway sounds  on exam.  No obvious wheezing.  HEART:  Regular rate and rhythm without murmurs, rubs, or gallops.  ABDOMEN:  Soft and nontender.  Bowel sounds are positive.  SKIN:  Notable for the above.  An old ankle fusion scar on the left.  The patient had some mild stasis changes in the lower extremities and a  well-healed incision on the plantar surface of the right fifth toe  laterally.  NEUROLOGICAL:  Cranial nerves II through XII are grossly intact with  good cough noted today, good oral motor control.  Reflexes are 2+.  Sensation is grossly intact except for that noted above.  Strength is  4+/5 to 5/5 in the upper extremities.  Right lower extremities 4/5  proximal to 5/5 distal.  Left lower extremities 1 to 1+/5 due to left  hip pain, 3/5 in the knee, 2 to 3/5 at the ankle due to the fusion.  Judgment, orientation, memory, and mood are all within functional  limits.  He may have some mild short-term memory deficits, but memory  did seem to be functional.   ASSESSMENT AND PLAN:  1. Functional deficits, secondary to sepsis syndrome with hepatic      encephalopathy in the setting of psoriatic arthritis and avascular      necrosis of the left hip.  The patient was admitted to receive      collaborative interdisciplinary care between the physiatrist, rehab      nursing staff, and therapy team while on inpatient rehab.  The      patient's level of  medical complexity and substantial therapy needs      in the context of this medical necessity cannot be provided a      lesser intensity of care.  Physiatrist will provide 24-hour      management of the medical needs, as well as over side of therapy  plan/treatment and provide guidance as appropriate regarding the      interaction of the two.  A 24-hour rehab nurse will follow and      assist in the management of the patient's bladder and bowel      function/continence.  We will assess for appropriate nutrition,      medication administration.  They will follow along with sugars, as      well as blood pressure on a regular basis.  They also will help      integrate therapy concepts, techniques, education, etc.  Physical      Therapy will assess and treat for transfers and mobility in the      setting of this deconditioning left hip pain.  Occupational Therapy      will assess and treat for upper extremity use/ADLs, and appropriate      equipment needs, as well as cognitive/perceptual training.  Speech      Language Pathology will assess and treat for severe dysphagia and      higher-level cognitive function.  Case management/social worker      will assess for psychosocial needs and discharge planning.  Team      conferences will be held weekly to establish goals, set progress,      and determine barriers to discharge.  Rehab goals are supervision      to modified independent.  Estimated length of stay is 2 weeks.      Prognosis good.  2. Hypoglycemia:  We will monitor CBGs for now while on liquid diet.      Cover with sliding scale insulin as appropriate.  3. Chronic bilateral lower extremity edema:  Continue Lasix.  4. Obstructive sleep apnea:  CPAP nightly.  5. Psoriatic arthritis.  Resume prednisone at 10 mg daily.  6. Hypertension:  Continue with Benicar and Lasix.  Monitor with      activities for hypotension or hypertension.  7. Diarrhea:  Florastor 250 mg b.i.d.  To watch for  improvement with      diet changes.  To use Lomotil p.r.n..  8. Insomnia:  Restoril 7.5 mg nightly.  9. Genitourinary:  Consider voiding trial in the next 1-2 days.  10.Deep vein thrombosis prophylaxis, subcu Lovenox.  11.Pain management with Neurontin and Vicodin.  We will need to      schedule Vicodin likely prior to therapies due to severity of left      hip pain.  Consider long-acting opiate.      Ranelle Oyster, M.D.  Electronically Signed     ZTS/MEDQ  D:  11/09/2007  T:  11/10/2007  Job:  [id number]

## 2010-08-21 NOTE — Assessment & Plan Note (Signed)
[facility]                            CARDIOLOGY OFFICE NOTE   OSMAN, Johnny Patrick                       MRN:          [id number]  DATE:07/09/2007                            DOB:          12/17/1945    Mr. Cathey returns today for difficult to manage blood pressure and  lower extremity edema.  He has gained some weight over the winter.  He  is up to 192 from 183.  His blood pressure has been in excellent  control.  He looks remarkably good.  He has no specific complaints other  than slight increase in his lower extremity edema.  This was a major  problem when he first saw [place].   PHYSICAL EXAMINATION:  His blood pressure, today, is 138/88.  His pulse  is 63 and regular.  His weight is 192.  HEENT:  His skin is much less ruddy.  PERRL.  Extraocular movements are  intact.  Sclerae are slightly injected.  Facial symmetry is normal.  Carotids are full.  There is no JVD.  Thyroid is not enlarged.  Trachea  is midline.  LUNGS:  Clear.  HEART:  Reveals a nondisplaced PMI.  No S1-S2.  No gallop.  ABDOMINAL EXAM:  Protuberant, good bowel sounds.  EXTREMITIES:  Reveal 2+ edema in the right ankle which has been operated  on before.  His left has 1+.  No sign of DVT.  Pulses are intact.  NEURO:  Exam is intact.   EKG:  Today shows sinus rhythm with a left axis deviation.  He has some  nonspecific ST-segment changes inferiorly.   ASSESSMENT/PLAN:  Mr. Nohr problem of lower extremity edema appears  to be a weight related phenomenon.  We will check a chemistry, today, to  make sure his BUN and creatinine are stable as is his potassium.  In the  past he has had mild hyperkalemia from his angiotension receptor  blockers, and probably prednisone.  We will make sure that that is not  the case today.   I will plan to see him back in 6 months.  I have encouraged him to lose  weight.     Thomas C. Daleen Squibb, MD, [facility]  Electronically Signed    TCW/MedQ  DD:  07/09/2007  DT: 07/09/2007  Job #: [id number]

## 2010-08-21 NOTE — Assessment & Plan Note (Signed)
[facility]                       [place] CARDIOLOGY OFFICE NOTE   PERFECTO, PURDY                       MRN:          [id number]  DATE:09/20/2008                            DOB:          Aug 18, 1945    Mr. Daversa comes in today for followup.  He has no specific complaints  today.  He is working 3 different gardens!   He denies any orthopnea or PND.  He has really had very little  peripheral edema.  His weight unfortunately is not going down but I  think it is lean body mass.   Looking back in his chart, he had a 2-D echocardiogram while he was in  the hospital with his septic episode.  His EF was 35% at that time.  An  echo has not been followed up since then.  At that time, he was hypoxic  and there was a question of whether not he was having some myocardial  strain.   His meds are unchanged since his last visit.  Specifically:  1. Diovan 320 mg per day.  2. Potassium 20 mEq a day.  3. Carvedilol 25 mg p.o. b.i.d.  4. He wears CPAP at night.  5. Synthroid 150 mcg a day.  6. Prednisone 10 mg per day.  7. Calcium.  8. Multivitamin.  9. Vitamin D.  10.Symbicort 160/4.5 daily.  11.Vitamin C daily.  12.Furosemide 40 mg a day.  13.Arava 20 mg for arthritis.   He recently had blood work with Dr. Estill Bakes.  His last potassium was  normal at 5.2.   PHYSICAL EXAMINATION:  GENERAL:  His exam today, he is in no acute  distress.  He is not as red faced or ruddy as usual.  HEENT:  Otherwise unchanged.  NECK:  No JVD.  Carotid upstrokes were equal bilaterally without bruits.  Thyroid is not enlarged.  Trachea is midline.  LUNGS:  Clear to auscultation and percussion.  HEART:  A nondisplaced PMI.  There is no gallop or rub.  ABDOMEN:  Soft with good bowel sounds.  No midline bruit.  EXTREMITIES:  Only trace edema at the ankles.  Pulses were present.  NEUROLOGIC:  Intact.   His blood pressure today was rechecked, 140/82.   ASSESSMENT AND  PLAN:  Mr. Gervase weight has slowly increased, which I  think is lean body mass and not edema.  His blood pressures under pretty  good control considering his history.  He clearly needs a followup 2-D  echocardiogram to see if his LV function, is now recovered.  I suspect  that has.  His right-sided function has always been good.   We went over his medications and made sure he was on the right ones.  We  will check his 2-D echocardiogram.  We will plan to seeing him back  again in 6 months.     Thomas C. Daleen Squibb, MD, [facility]  Electronically Signed    TCW/MedQ  DD: 09/20/2008  DT: 09/21/2008  Job #: [id number]   cc:   Areatha Keas, M.D.

## 2010-08-21 NOTE — Consult Note (Signed)
NAMEZACHRY, HOPFENSPERGER                ACCOUNT NO.:  [id number]   MEDICAL RECORD NO.:  [id number]          PATIENT TYPE:  INP   LOCATION:  1234                         FACILITY:  [facility]   PHYSICIAN:  Veverly Fells. Excell Seltzer, MD  DATE OF BIRTH:  Aug 06, 1945   DATE OF CONSULTATION:  10/27/2007  DATE OF DISCHARGE:                                 CONSULTATION   PRIMARY CARDIOLOGIST:  Dr. Daleen Squibb.   PRIMARY CARE PHYSICIAN:  Dr. Phylliss Bob.   REASON FOR CONSULTATION:  Non-ST elevation MI.   HISTORY OF PRESENT ILLNESS:  Johnny Patrick is a very nice 65 year old  gentleman who was hospitalized yesterday after a fall.  He has been  progressively weak for the last 48 hours.  Later in the day he was found  to be lethargic and less responsive with some confusion.  His wife  brought him to the emergency room for evaluation.   Over the last several weeks the patient has been treated for right foot  abscess and has received a long course of antibiotics with a PICC line  and IV vancomycin at home.  He also has been treated with Augmentin.  In  the emergency department he was noted to have a temperature of 101.1  with associated hypotension.  He also has had low oxygen saturations.  In the setting of his acute illness cardiac enzymes were drawn, and he  initially had a troponin of 0.51.  The follow-up troponin is 0.38.  His  CK-MB has also been elevated, but the ratio has been normal with an  initial total CK of 2020 and an MB fraction of only 5.7.  In addition to  this he has hematologic abnormalities with thrombocytopenia and a  platelet count of 49,000 as well as leukopenia with a white blood cell  count of 2200.   Johnny Patrick denies chest pain or dyspnea.  He feels poorly but has no  specific complaints.  He denies orthopnea or PND.  He has had chronic  lower extremity edema and has been treated.  This is felt to be  multifactorial from chronic steroids and venous insufficiency.  He has  been followed by Dr. Daleen Squibb as  an outpatient and has undergone an  echocardiogram approximately 1 year ago that showed normal left  ventricular size and systolic function with an LVEF of 55-65%.  There  were no abnormalities with right heart and pulmonary pressures were  estimated to be normal.   CURRENT MEDICATIONS:  1. Protonix 40 mg daily.  2. Docusate at bedtime.  3. Prednisone 20 mg 1 time dose and then 10 mg daily.  4. Rocephin 2 g IV every 24 hours.  5. Xopenex as needed.   ALLERGIES:  NONE.   HOME MEDICATIONS:  1. Coreg.  2. Diovan.  3. Fluconazole.  4. Furosemide.  5. Neurontin.  6. Prednisone.  7. Synthroid.  8. Calcium.  9. Cyclobenzaprine.  10.Temazepam.  11.Endocet/leflunomide.   PAST MEDICAL HISTORY:  1. Essential hypertension.  2. Hypothyroidism.  3. Chronic lower extremity edema.  4. Gastroesophageal reflux disease.  5. Nephrolithiasis.  6. History of  colon polyps.  7. Right foot abscess with osteomyelitis status post drainage and      resection of the 5th metatarsal shaft.  8. Obstructive sleep apnea.  9. Psoriatic arthritis.  10.Multiple lumbar surgeries.  11.History of AVN of the left femoral head.   SOCIAL HISTORY:  The patient is married and he lives in [place].  He  is retired.  He denies alcohol or tobacco.   FAMILY HISTORY:  Father died from a motor vehicle accident.  His mother  died of heart failure.   REVIEW OF SYSTEMS:  Pertinent positives included chronic left hip pain,  chronic leg edema worse on the left, low back pain, diarrhea that  started just today.  All other systems were reviewed and are negative  except as above.   PHYSICAL EXAMINATION:  GENERAL:  The patient is alert and oriented.  He  is in moderate distress, but he is in no respiratory distress.  VITAL SIGNS:  Heart rate is 130 beats per minute, blood pressure 147/98,  respiratory rate is in the 30s.  He currently is afebrile with  temperature 97.8, oxygen saturation 97% on 4 liters of oxygen.   HEENT:  Normal.  NECK:  Normal carotid upstrokes without bruits.  Jugular venous pressure  is normal.  No thyromegaly or thyroid nodules.  LUNGS:  Clear bilaterally.  HEART:  The apex is discrete and nondisplaced, heart is tachycardiac and  regular without murmurs or gallops.  ABDOMEN:  Soft, nontender, slightly tympanic, bowel sounds are present.  BACK:  No CVA tenderness.  EXTREMITIES:  There is 2+ edema on the left, 1+ edema on the right.  The  feet are cool.  SKIN:  There is no rash or redness.  NEUROLOGIC:  The patient is alert and oriented.  He answers questions  appropriately.  There are no focal deficits.  Strength intact and equal.   EKG from yesterday shows normal sinus rhythm with a heart rate of 90.  There is an age indeterminate septal MI and left axis deviation, no  significant ST-segment or T-wave changes.   LABORATORY DATA:  Labs as per the HPI with slight increase in troponin  and marked increase in total CK with only mild increase in MB fraction,  thrombocytopenia with platelet count 49,000.  White blood cell count  2200, hemoglobin 11.1.  Potassium 4.3, creatinine 1.1.  LFTs are  markedly elevated with an ALT 198, AST 353, alk phos 476.   ASSESSMENT:  This is a 65 year old gentleman with a type 2 non-ST  elevation myocardial infarction (demand ischemia).  The underlying  process is likely sepsis.  Cultures have been drawn and are currently  pending.  The patient has been on antibiotics as noted as an outpatient.  He currently is on Rocephin.   From a cardiac perspective he likely has had a non-ST elevation  myocardial infarction secondary to diffuse subendocardial ischemia in  the setting of hypotension, tachycardia, and sepsis.  He also was  hypoxemic at presentation.  Recommend supportive care with continued  antibiotics and intravenous fluids as needed.  I would withhold anti-  platelet or antithrombotic therapy in the setting of his cytopenias.  I  think the  risk/benefit of these medications is unfavorable in the  setting of a platelet count less than 50,000.  I would also hold off on  beta blockers and anti-ischemic medications because his hemodynamics  need to be preserved.  If he develops symptoms of chest pain or ischemia  then beta  blockade should be initiated.  Will follow cardiac enzymes,  and clinically will follow along with you.  After he recovers from this  acute illness we will consider the most prudent cardiac evaluation.  In  the meantime a 2-D echo will be ordered to reassess his left ventricular  function which has been previously normal.   Thank you for the opportunity to participate in the care of this very  nice gentleman.  Will follow along during his hospitalization.      Veverly Fells. Excell Seltzer, MD  Electronically Signed     MDC/MEDQ  D:  10/27/2007  T:  10/27/2007  Job:  [id number]   cc:   Sherrie Mustache, M.D.

## 2010-08-21 NOTE — Assessment & Plan Note (Signed)
[facility]                            CARDIOLOGY OFFICE NOTE   [name], [name]                       MRN:          [id number]  DATE:01/13/2007                            DOB:          [birth year]    Johnny Patrick returns today for further management of his hypertension and  lower extremity edema.   He has had remarkable response to some minor adjustments with his blood  pressure meds.  He continues to do well.  He denies any edema.  He is  having some urgency with the Furosemide and was wondering if we could  cut the dose back.   His meds are listed and are unchanged since last visit.   His blood pressure today is 130/89.  When we rechecked it, it was  140/90.  He has not taken his Furosemide today.  His heart rate is 75.  His weight is up a couple of pounds to 183.  He is in no acute distress.  His face, skin color is normal.  NECK:  No JVD.  Carotids are full.  Neck is supple.  LUNGS:  Clear.  HEART:  Regular rate and rhythm without gallop.  ABDOMEN:  Soft.  EXTREMITIES:  No edema.  Pulses are intact.   Blood work checked last time showed stable BUN of 20, creatinine 0.8,  potassium 5.0.   I am delighted with how Johnny Patrick responded to our measures.  We will  cut is Furosemide back to 20 mg a day.  If he begins to have lower  extremity edema, he will increase it on his own to 40 mg per day.  I  will see him back in 6 months.     Thomas C. Daleen Squibb, MD, [facility]  Electronically Signed    TCW/MedQ  DD: 01/13/2007  DT: 01/13/2007  Job #: [id number]   cc:   Areatha Keas, M.D.

## 2010-08-21 NOTE — H&P (Signed)
Johnny Patrick, Johnny Patrick                ACCOUNT NO.:  [id number]   MEDICAL RECORD NO.:  [id number]          PATIENT TYPE:  INP   LOCATION:  0111                         FACILITY:  [facility]   PHYSICIAN:  Elliot Cousin, M.D.    DATE OF BIRTH:  [birth year]   DATE OF ADMISSION:  10/26/2007  DATE OF DISCHARGE:                              HISTORY & PHYSICAL   PRIMARY CARE PHYSICIAN:  Dr. Phylliss Bob.   PRIMARY CARDIOLOGIST:  Dr. Valera Castle   PULMONOLOGIST:  Dr. Jetty Duhamel.   ORTHOPEDIC SURGEONS:  Dr. Lestine Box and Dr. Cleophas Dunker.   NEUROSURGEON:  Dr. Trey Sailors.   CHIEF COMPLAINT:  Status post fall, confusion.   HISTORY OF PRESENT ILLNESS:  The patient is a 65 year old man with a  past medical history significant for a recent right foot abscess, status  post I&D and partial resection of the 5th metatarsal shaft, psoriatic  arthritis, chronic bilateral lower extremity edema, hypothyroidism, and  left avascular necrosis of the hip.  The patient was in his usual state  of health until yesterday.  He attempted to get up out of his recliner  and apparently the foot rest flipped up and he fell on the floor.  He  does not recall hitting his head.  He did not loose consciousness.  His  wife heard the fall and came to assist him.  The patient was able to  ambulate back to the chair with the assistance of his wife.  This  occurred early Sunday morning.  The patient stated that he felt fine.  His wife proceeded to go to church and later on in the afternoon when  she came home, she found the patient still in the recliner, sitting.  The patient was somewhat confused.  He appeared to be lethargic  according to his wife.  She had difficulty arousing him.  He was finally  aroused and with assistance was able to ambulate to the kitchen with a  walker.  His wife, who is a Engineer, civil (consulting), did notice an unsteady gait and  slurred speech.  There was no facial droop or focal weakness.  There was  a mild hand tremor bilaterally,  but no evidence of tonic-clonic seizure  activity.  The patient proceeded to eat a small sandwich and there was  no difficulty chewing or swallowing.  The patient states that he has not  had subjective fever and chills, any upper respiratory infection  symptoms, cough, painful urination, or diarrhea.  He has had no nausea  or vomiting.  He has a mild headache on the right posterior area of the  head.  He has had no changes in his medications.  He does not believe he  took any of his medicines inappropriately.   The patient just completed a long course of antibiotic therapy first  with outpatient vancomycin and lately with Augmentin.  The patient  finished Augmentin 3 days ago for treatment of the right foot abscess  and osteomyelitis from June of 2009.   During the evaluation in the emergency department, the patient was noted  to be febrile with a  temperature of 101.1 initially.  His blood pressure  was 81/43 initially.  His temperature is now 98.9 and his blood pressure  is now 100/64 after Tylenol and IV fluids.  His oxygen saturation was  86% on room air when he arrived to the emergency department.  CT scan of  his head without contrast reveals no acute intracranial abnormality.  His chest x-ray reveals mild left lower lobe atelectasis.  His lab data  are remarkable for a WBC of 2.6, hemoglobin of 10.7, platelet count of  63,000, BUN of 48, venous glucose of 178, and a creatinine of 1.92.  The  patient will be admitted for further evaluation and management.   PAST MEDICAL HISTORY:  1. Right foot abscess and osteomyelitis status post incision and      drainage and partial resection of the right 5th metatarsal shaft by      Dr. Lestine Box September 08, 2007.  2. Hypertension.  3. Obstructive sleep apnea, on chronic CPAP therapy.  4. Rhinitis.  5. Bronchitis.  6. Psoriatic arthritis.  7. Gastroesophageal reflux disease.  8. Hiatal hernia.  9. Chronic bilateral lower extremity edema.   10.Hypothyroidism.  11.Status post excision of a left brachial mass with the pathology      report revealing fat necrosis with infiltration.  Excision was by      Dr. Teressa Senter February 2009.  12.Status post multiple lumbar surgeries by Dr. Channing Mutters in May of 2003 and      in July of 2005.  13.Colon polyps and internal and external hemorrhoids per colonoscopy      by Dr. Virginia Rochester in October 2002.  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   MEDICATIONS:  1. Synthroid 15 mcg daily.  2. Prednisone 5 mg daily.  3. Arava 20 mg daily.  4. Endocet 10/650 mg every 8 hours p.r.n.  5. Cyclobenzaprine 10 mg every 8 hours as needed.  6. Diovan 320 mg daily.  7. Furosemide 40 mg daily.  8. Carvedilol 12.5 mg b.i.d.  9. Calcium 600 mg t.i.d.  10.Centrum Silver 1 tablet daily.  11.Vitamin D 400 mg daily.  12.Temazepam 15 mg 1-2 tablets nightly p.r.n.  13.CPAP.   ALLERGIES:  No known drug allergies.   SOCIAL HISTORY:  The patient is married.  [place]  He has one child.  He is retired and disabled.  He denies  tobacco, alcohol and illicit drug use.  He has not been driving lately.   FAMILY HISTORY:  His mother died of congestive heart failure and his  father died from complications of a motor vehicle accident.   REVIEW OF SYSTEMS:  The patient's review of systems is positive for  chronic left hip pain, chronic swelling in the legs, particularly the  left, low back pain, otherwise review of systems is negative.   EXAMINATION:  Temperature 98.8, blood pressure 100/64, pulse 104,  respiratory rate 21, oxygen saturation 100% on a non-rebreather.  GENERAL:  The patient is initially sleeping but arousable and alert.  He  is in no acute distress.  HEENT: Head is normocephalic, nontraumatic.  Pupils are equal, round,  reactive to light.  Extraocular muscles intact.  Questionable right eye  ptosis.  Tympanic  membranes clear bilaterally.  Nasal mucosa is mildly  dry.  No sinus tenderness.  Oropharynx reveals mildly dry mucous  membranes.  No posterior exudates or erythema.  Conjunctivae are clear.  Sclerae are white.  NECK:  Obese and supple.  No adenopathy, no thyromegaly, no bruit or  JVD.  No nuchal rigidity.  LUNGS:  Decreased breath sounds in the bases, otherwise clear.  HEART:  S1,S2 with a soft systolic murmur and mild tachycardia.  ABDOMEN:  Obese, positive bowel sounds, soft, nontender, nondistended.  No hepatosplenomegaly.  No masses palpated.  EXTREMITIES: 3+ to 4+ left lower extremity edema, particularly left  lower extremity pedal edema.  Trace of right lower extremity edema.  Left hip mildly tender with no surrounding erythema or edema.  The  patient is unable to lift up his left leg but he is able to flex and  extend his foot.  The patient is able to lift up his right leg against  gravity.  Sensation grossly intact.  There are multiple varicosities  about both ankles, particularly the left ankle.  NEUROLOGIC:  The patient is alert and oriented to himself, his wife,  place, year and month.  Cranial nerves II-XII appear to be grossly  intact with the exception of a questionable right eye ptosis.  Strength  is 5/5 in the supine position and sitting position with exception of the  left lower extremity, which is weak because of pain.  Sensation is  grossly intact.  Gait not assessed.   ADMISSION LABORATORIES:  Urinalysis:  Moderate blood, 30 protein,  negative nitrites, negative leukocytes.  WBC 2.6, hemoglobin 10.7,  platelets 63.  Sodium 133, potassium 4.2, chloride 99, CO2 of 25,  glucose 178, BUN 48, creatinine 1.92, calcium 8.4.   ASSESSMENT:  1. Confusion, altered mental status, unsteady gait, status post fall.      The patient's head appears to be normocephalic and nontraumatic.      The CT scan of his head revealed no acute abnormalities.  The      patient appears to be  neurologically intact and not really confused      at this time, although there was a questionable right eye ptosis      that appeared to be transient and now has resolved.  During the      history, the patient could recall some detail about his history.      The etiology of his confusion is unclear at this time.  We will      consider sepsis, the effects of medication, and hypoxia.  2. Sign symptom complex with fever, hypotension, leukopenia, and      thrombocytopenia.  The differential diagnosis includes sepsis,      medication effect from multiple antibiotic treatment over the past      several weeks, and other.  The patient is currently afebrile and      his blood pressure improved rather quickly with IV fluid volume      repletion.  His urinalysis is essentially negative for white blood      cells and his chest x-ray is not consistent with pneumonia.  The      patient will be evaluated further with a CT scan of his left hip      given that he has had pain and avascular necrosis.  Otherwise,      there appears to be no obvious source of potential sepsis. Of note,      both his platelet count and white blood cell count were within      normal limits in June 2009.  3. Acute renal insufficiency.  The patient's BUN is 48 and his  creatinine is 1.92.  In comparison, in April of 2009, his BUN was      17, his creatinine was 1.0.  The acute renal insufficiency may be      secondary to volume depletion as the patient has had poor oral      intake over the past couple of days.  This is also in the setting      of angiotensin receptor blocker therapy.  4. Hypoxia.  The the patient's oxygen saturation was 86% on room air      on his arrival to the emergency department.  He does have      obstructive sleep apnea.  However, in light of the very edematous      left lower extremity, deep vein thrombosis and pulmonary embolism      are concerns.  5. Recent antibiotic treatment with vancomycin  and Augmentin for      treatment of osteomyelitis in June of 2009.   PLAN:  1. The patient will be admitted to the step-down unit.  2. We will check blood cultures, a CT scan of the left hip and x-ray      of the right foot to rule out infection.  3. We will follow the patient's CBC indices and will consider a      Hematology consultation.  We will also check a vitamin B12 level,      folate, and TSH.  4. We will check a urine drug screen, D-dimer, and bilateral lower      extremity venous Dopplers as well as cardiac enzymes for further      evaluation.  We will start Rocephin empirically, 2 grams IV every      24-hour dosing.  We will continue IV fluid volume repletion.  5. We will hold the antihypertensive medications until his blood      pressure improves.  6. We will give a stress dose of intravenous hydrocortisone and taper      prednisone over the next few days back down to his usual dose of 5      mg daily.  7. We will hold Arava as it can cause sepsis.  8. If there is evidence of deep vein thrombosis, we will start      anticoagulation with caution given his current thrombocytopenia.      Elliot Cousin, M.D.  Electronically Signed     DF/MEDQ  D:  10/26/2007  T:  10/26/2007  Job:  [id number]   cc:   Areatha Keas, M.D.  Fax: [phone]   Claude Manges. Cleophas Dunker, M.D.  Fax: [phone]   Payton Doughty, M.D.  Fax: [phone]   Leonides Grills, M.D.  Fax: [phone]   Jesse Sans. Wall, MD, FACC  [address]  [address]  [place]  [place] [address]   Joni Fears D. Maple Hudson, MD, FCCP, FACP  [facility]-P[facility]  [address]  [place]  [place] [address]

## 2010-08-21 NOTE — Assessment & Plan Note (Signed)
[facility]                             PULMONARY OFFICE NOTE   NAME:PEGRAMRip, Hawes                       MRN:          [id number]  DATE:01/28/2007                            DOB:          1946/01/26    PROBLEM:  1. Rhinitis.  2. Bronchitis.  3. Esophageal reflux with hiatal hernia.  4. Obstructive sleep apnea.  5. Hypertension.  6. Psoriatic arthritis.   HISTORY:  Six month return for pulmonary followup.  He continues trying  to make CPAP work for himself at 10 CWP but still pulls it off a lot at  night, we are going to ask [facility] to do an auto titration  for pressure check.  He is working with Dr. Daleen Squibb on his blood pressure  and has been diuresing.  He has an appointment pending next month with  Dr. Lestine Box for orthopedic attention to his foot.  He shows me a  fluctuant area at the base of the right little toe which is not weeping.  He says Dr. Lestine Box gave him a month of Levaquin in the Spring and that  cleared up completely until his feet swelled.  He asked if I could put  him back on antibiotic until his appointment with Dr. Lestine Box and I had  discussed his need to follow up appropriately with his rheumatologist  and orthopedist.   MEDICATIONS:  1. Diovan 320 mg.  2. Furosemide 20 mg.  3. Fluconazole 200 mg.  4. Coreg 12.5 mg b.i.d.   NO MEDICATION ALLERGY.   OBJECTIVE:  Weight 188 pounds, blood pressure 140/92, pulse 68, room air  saturation 98%.  There is a 1-cm diameter fluctuant area with callus as  described.  It does not seem especially tender.  There is no obvious  inflammation, no edema at the ankle.   IMPRESSION:  1. Obstructive sleep apnea, still not satisfactorily controlled.  We      are going to recheck pressure assessment.  2. Rhinitis and bronchitis are not currently active.  3. Pressure area right foot.  After some discussion I decided it was      probably in his best interest if I go ahead and start an  antibiotic      pending his ability to follow up with his other doctors, but      explained the concern about chronic infection in the foot,      potential osteomyelitis, etc.   PLAN:  1. [facility] is to auto titrate the CPAP for [place].  2. Levaquin 500 mg daily.  He is to contact Dr. Lestine Box office and      make sure this is okay.  3. Schedule return here 6 months, earlier p.r.n.     Clinton D. Maple Hudson, MD, [facility], FACP  Electronically Signed    CDY/MedQ  DD: 01/28/2007  DT: 01/29/2007  Job #: [id number]   cc:   Areatha Keas, M.D.  Petra Kuba, M.D.  Leonides Grills, M.D.

## 2010-08-21 NOTE — Assessment & Plan Note (Signed)
[facility]                            CARDIOLOGY OFFICE NOTE   ASAPH, SERENA                       MRN:          [id number]  DATE:09/19/2006                            DOB:          30-May-1945    Mr. Boran returns today to discuss the findings of his 2-D echo.  I saw  him initially Sep 05, 2006 for lower extremity edema.   His 2-D echo was to rule any right-sided problems including pulmonary  hypertension.  He does have obstructive sleep apnea, and COPD.   His left ventricular ejection fraction was 55% to 65%.  He had no wall  motion abnormalities.  He had mild LVH from his hypertension.  There was  mild aortic regurgitation, mild mitral valve regurgitation, mild left  atrial enlargement.  His right side was remarkably normal.   I spent about 20 minutes talking to Mr. Komatsu about his echo findings.  I would recommend a followup in a year on his valves with me.  Otherwise, I made no changes in recommendations, except to take all of  his Diovan/hydrochlorothiazide in the morning instead of splitting it up  to get a better blood pressure effect and a better diuretic effect.  I  have also advised to take his Sular in the mornings.   If his blood pressure is not at goal, he will let me know.  Otherwise, I  will see him back in a year.     Thomas C. Daleen Squibb, MD, [facility]  Electronically Signed    TCW/MedQ  DD: 09/19/2006  DT: 09/20/2006  Job #: [id number]   cc:   Areatha Keas, M.D.  Payton Doughty, M.D.

## 2010-08-21 NOTE — Assessment & Plan Note (Signed)
[facility]                            CARDIOLOGY OFFICE NOTE   RONEN, BROMWELL                       MRN:          [id number]  DATE:03/09/2008                            DOB:          17-Feb-1946    Mr. Blayney comes in today for followup.   He has had his left hip replaced by Dr. Norlene Campbell.  He got along  well except for a drop in his hemoglobin to 8.5, which necessitated a  couple of units of blood.  His last hemoglobin on February 20, 2008, was  10.5.   He is on low-dose Coumadin and will probably be coming off this next  Monday when he sees Dr. Cleophas Dunker.  He reports no bleeding or any other  side effects.   He has no lower extremity edema and his activity is slowly improving.  He is being seen by Vashti Hey, RN, of Home Health, who states that his  heart rate is usually in the 70s, though today is 100.   He denies any orthostatic symptoms.  He has had no chest pain.   His blood pressure today is 140/84, his pulse is 101.  I rechecked it  after he had been sitting for 15 minutes.  It was still around 90-100.  He is in no acute distress.  His weight is 187, down 2.  Skin color  looks the best I have seen it.  Neck shows no JVD.  Carotids are full  without bruits.  Lungs are clear to auscultation and percussion.  Heart  reveals a regular rate and rhythm.  No gallop.  Abdominal exam is soft,  good bowel sounds.  No midline bruit, pulsatile mass.  Extremities with  no cyanosis, clubbing, or edema.  Pulses are present.  His nail beds are  pink and his hand creases are also nice and pink as are his  conjunctivae.   Mr. Longton is doing well.  I am not happy with his heart rate being as  high as it is.  I have increased his Coreg to 25 b.i.d.  I suspect his  hemoglobin is around 11-12, but we will check a CBC.  He remain on iron  325 mg p.o. b.i.d.  Hopefully, he will come off his Coumadin next week  with Dr. Regenia Skeeter direction.   I will plan on seeing him back  again in 3 months.     Thomas C. Daleen Squibb, MD, [facility]  Electronically Signed    TCW/MedQ  DD: 03/09/2008  DT: 03/10/2008  Job #: [id number]

## 2010-08-21 NOTE — Assessment & Plan Note (Signed)
Johnny Patrick is here in followup of his left hip pain.  He was with [place] on rehab  last month after deconditioning with hepatic encephalopathy.  He was  placed on a fentanyl patch while under my care and did very well with  this.  He continues on a 12-mcg patch at home.  He is still having some  pain and he is not sure if the patch is helping that much at this point.  He is interested in going off the patch.  He has hydrocodone and  oxycodone at home, which he has used in the past for breakthrough pain  and wants to give that a try.  He rates his pain a 6/10.  He describes  it as stabbing and aching.  Pain interferes with general activity, in  relationship with others, and enjoyment of life on a moderate level.  Sleep is fair.  The patient is being followed up by GI Medicine and  seems to have had a good report.  He sees Dr. Cleophas Dunker later this fall  for assessment of his hip.  He should be in line for hip replacement  soon.   REVIEW OF SYSTEMS:  Notable for the above.  The patient denies any  nausea, confusion, etc.  Full review is in the written health and  history section and other pertinent positives are above.   SOCIAL HISTORY:  The patient is married, and wife is present with him  today.   PHYSICAL EXAMINATION:  VITAL SIGNS:  Blood pressure is 144/84, pulse is  85, respiratory rate 18, and he is sating 93% on room air.  GENERAL:  The patient is pleasant, alert, and oriented x3.  Affect is  bright and appropriate.  MUSCULOSKELETAL:  Left hip is painful with weightbearing and he is  antalgic to that side.  He uses the walker to unload the left leg.  Left  leg is stable from a neurovascular standpoint.  SKIN:  Intact.  He does have an area over the right occiput with a 1-1/2-  to 2-inch long segment of eschar.  It is still quite adhered to the  skin.  BACK:  Stable, although he has some pain with flexion more than  extension.  NEUROLOGICAL:  Cognitively, he is intact with good insight,  awareness,  and memory.  The cranial nerve exam is normal.   ASSESSMENT:  1. History of avascular necrosis, left hip.  2. Lumbar spondylosis/postlaminectomy syndrome.  3. History of hepatic encephalopathy.  4. History of psoriatic arthritis.   PLAN:  1. We will observe the patient on Vicodin and oxycodone, which he has      at home.  We can consider increasing fentanyl if he finds the pain      spikes after coming off the patch.  He will need to come and pick a      prescription up here at the office.  2. Ultimately, decision regarding his pain medication comes down to      the quality of life and timing of his hip surgery.  I would assume,      considering his recent medical complications that hip replacement      would not be      considered before December or January.  3. I will see Mr. Edenfield back on an as-needed basis in the future.      Ranelle Oyster, M.D.  Electronically Signed     ZTS/MedQ  D:  12/18/2007 11:23:06  T:  12/19/2007 01:16:40  Job #:  [id number]   cc:   Claude Manges. Cleophas Dunker, M.D.  Fax: [phone]

## 2010-08-21 NOTE — Assessment & Plan Note (Signed)
[facility]                            CARDIOLOGY OFFICE NOTE   ESTABAN, MAINVILLE                       MRN:          [id number]  DATE:09/05/2006                            DOB:          [birth year]    CARDIOLOGY CONSULTATION NOTE   I was asked by Dr. Payton Doughty to consult on Melony Overly Haft for lower  extremity edema.   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   He has had lower extremity edema now for some time.  It got worse with  Norvasc for treatment of his hypertension.  He is now on Sular 20 mg  nightly, which has helped.   He denies any orthopnea, PND, or any symptoms of congestive heart  failure.  He denies any ischemic symptoms.  Denies any dyspnea on  exertion.   He has a history of sleep apnea and is on CPAP.   PAST MEDICAL HISTORY:  He is intolerant of no medications.  He does not  have a dye allergy.  He smoked up until 12 years ago.  He does not drink  alcohol.  He is very active on his farm.   SURGICAL HISTORY:  Hernia repair in 2000, back fusion in 2003, and  another back fusion in 2005.   FAMILY HISTORY:  Negative for any premature coronary disease.   CURRENT MEDICATIONS:  1. Potassium 20 mEq a day.  2. Synthroid 15 mcg a day.  3. Diovan hydrochlorothiazide 160/25 b.i.d.  4. Prednisone 5 mg a day.  5. Methotrexate 20 mg a day.  6. Cyclobenzaprine 10 mg a day.  7. CPAP.  8. Sular 20 mg p.o. nightly.  9. Symbicort.   He sees Dr. Fannie Knee for his pulmonary and Dr. Estill Bakes for his  arthritis and general medical.   SOCIAL HISTORY:  Retired from [employer].  He enjoys farming.   He is married and has 1 child.  L[address] [place] [address] [place] near  [place].   REVIEW OF SYSTEMS:  He has a history of a hiatal hernia, chronic  arthritis, thyroid disease.  Other than that, refer to the HPI.  Other  review of systems  negative.  He has had trauma to his left ankle, which  has made the edema worse in that segment.   Blood pressure today was 158/87 when he initially arrived.  His heart  rate was 104 and regular.  I repeated his pressure in his left arm after  he settled down, it was 140/85.  His heart rate remained around 100.  His weight is 191.  He is 5 feet 7 inches.  HEENT:  He has got a ruddy complexion, particularly in the cheeks.  Extraocular movements intact.  Sclerae clear.  Facial symmetry is  normal.  Carotid upstrokes are equal bilaterally without bruits.  There  is a question of some mild HJR.  His thyroid is normal.  Trachea is  midline.  LUNGS:  Clear.  HEART:  Nondisplaced PMI.  He has no obvious right ventricular lift.  He  has a normal S1, S2 without gallop.  ABDOMEN:  Soft with good bowel sounds.  There is no hepatomegaly.  EXTREMITIES:  No cyanosis, clubbing, but he does have 1+ pretibial edema  and 2+ edema at the left ankle.  Pulses are intact.  NEURO:  Intact.  SKIN:  Some ecchymoses.  He has some superficial varicosities of his  legs.  There is no sign of DVT.   ELECTROCARDIOGRAM:  Shows sinus rhythm at 94 with mild criteria for LVH.   ASSESSMENT:  Lower extremity edema.  This is multifactorial.  Contributing factors are age, varicose veins, ankle trauma and repair,  Sular, which is a calcium channel blocker, salt indiscretion,  obstructive sleep apnea with some chronic obstructive pulmonary disease,  prednisone.   PLAN:  1. A 2D echocardiogram to rule out pulmonary hypertension.  Will also      look at RV function.  2. Decrease salt intake.  3. Consider changing to a stronger diuretic.   I will plan on seeing him back on the day of his echo.  We will go over  any changes at that time and recommendations.     Thomas C. Daleen Squibb, MD, [facility]  Electronically Signed    TCW/MedQ  DD: 09/05/2006  DT: 09/05/2006  Job #: [id number]   cc:   Payton Doughty, M.D.  Areatha Keas, M.D.

## 2010-08-21 NOTE — Assessment & Plan Note (Signed)
[facility]                            CARDIOLOGY OFFICE NOTE   TIANDRE, [name]                       MRN:          [id number]  DATE:12/10/2006                            DOB:          September 12, 1945    Mr. Johnny Patrick comes in today per his request for managing his lower  extremity edema and his uncontrolled hypertension.   I have seen him in consultation on Sep 05, 2006.  I saw him back on September 19, 2006.   He had significant peripheral edema and has a history of COPD and  obstructive sleep apnea.  I obtained a two-dimensional echocardiogram  which showed an LV EF of 55-65%, no wall motion abnormalities, mild left  ventricular hypertrophy, mild aortic regurgitation, mild mitral valve  regurgitation, and his right side surprisingly was normal.  There was no  significant evidence of pulmonary hypertension.   His blood pressure has been out of control.  He stopped his Sular  because of his edema about 6 weeks ago.  He has been having a pan  cranial headache.  He denies any visual disturbances.   MEDICATIONS:  1. Potassium 20 mEq a day.  2. Synthroid 150 mcg a day.  3. Diovan hydrochlorothiazide 320/25 mg q.a.m.  4. Prednisone 5 mg a day.  5. Methotrexate.  6. Cyclobenzaprine q.8 hours p.r.n.  7. Calcium.  8. Multivitamin.  9. Vitamin D.  10.CPAP.  11.Symbicort.   PHYSICAL EXAMINATION:  VITAL SIGNS:  His blood pressure today was  170/113, pulse 84, and he is in sinus rhythm.  When I sat him up on the  table, his heart rate went above 100.  He has moderate criteria for LVH  but no other changes.  He has a pulmonary disease type pattern.  Weight  185.  HEENT:  His face is very ruddy.  Sclerae injected, PERRLA.  Facial  symmetry is normal.  NECK:  Supple.  Carotids are full without bruits.  No thyromegaly.  Trachea is midline.  LUNGS:  Clear breath sounds, but decreased breath sounds throughout.  HEART:  Dynamic S1 and S2.  He has an S4.  ABDOMEN:  Soft with good bowel sounds.  There is no midline bruit of  flank bruits appreciated.  EXTREMITIES:  No cyanosis, clubbing.  He has 1+ edema.  Pulses are  intact.  He has some varicose veins.  NEUROLOGY:  Intact.  SKIN:  Unremarkable.   ASSESSMENT:  1. Severe hypertension, uncontrolled.  2. History of calcium blocker induced peripheral edema.  3. Chronic obstructive pulmonary disease.  4. Obstructive sleep apnea on CPAP.   I have had a long talk with Mr. Pangilinan.  I have made the following  suggestions and it will take several weeks to get this under control.   PLAN:  1. Discontinue Diovan hydrochlorothiazide.  I have started him on just      plain Diovan 320 mg p.o. q.a.m.  2. Begin furosemide 40 mg p.o. q.a.m.  3. Continue potassium 20 mEq a day.  4. Begin carvedilol 12.5 mg p.o. b.i.d.   I will have him  return in a week.  I am sure we will have to make  subsequent adjustments.  If we cannot get it under control, we may need  to consider renal ultrasound as well as a gadolinium renal scan.  I will  check a chem-7 today.  I will also check a TSH.     Thomas C. Daleen Squibb, MD, [facility]  Electronically Signed    TCW/MedQ  DD: 12/10/2006  DT: 12/10/2006  Job #: [id number]   cc:   Areatha Keas, M.D.

## 2010-08-21 NOTE — Discharge Summary (Signed)
NAMEJAGGAR, BENKO                ACCOUNT NO.:  [id number]   MEDICAL RECORD NO.:  [id number]          PATIENT TYPE:  INP   LOCATION:  1402                         FACILITY:  [facility]   PHYSICIAN:  Leslye Peer, MD    DATE OF BIRTH:  1945/07/25   DATE OF ADMISSION:  10/26/2007  DATE OF DISCHARGE:  11/07/2007                               DISCHARGE SUMMARY   DISCHARGE DIAGNOSES:  1. Acute hepatic failure secondary to Augmentin, complicated by shock      liver, sepsis, and organ hypoperfusion.  2. Hepatic encephalopathy, toxic metabolic cephalopathy secondary to      problem #1.  3. 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  4. Acute respiratory failure secondary to acute lung injury in the      setting of sepsis septic shock.  5. Dysphagia.  6. Hypertension.  7. Obstructive sleep apnea.  8. Hypothyroidism.  9. Hyperglycemia.  10.Debility.  11.Diarrhea.   PROCEDURES:  1. Central venous catheter placed October 27, 2007, removed November 04, 2007.  2. Right basilic PICC line placed November 04, 2007.  3. Oral endotracheal tube placed October 27, 2007, removed October 31, 2007.  4. Radial A-line placed October 27, 2007, removed November 01, 2007.   LABORATORY DATA:  Date November 06, 2007, sodium 138, potassium 4.2,  chloride 101, CO2 of 31, glucose 146, BUN 22, creatinine 0.63, alkaline  phosphatase 245, AST 53, ALT 74.  C. diff toxin obtained on November 05, 2007, was negative.  Complete blood count with differential demonstrates  a white blood cell count of 6.8, hemoglobin 11.8, hematocrit 35.2,  platelet count of 123.  Cytomegalovirus was negative by PCR.  Hep B core  antibody was negative.  Hep C core antibody was negative.  Hep B surface  antigen was negative.  Blood fungal culture was negative.  HCV-RNA  quantitative was less than 43.  Epstein-Barr virus was notable for IgG  of 4.89, IgM of 0.18.   CONSULTANTS:  1. Dr. Johny Sax  with infectious disease.  2. Dr. Sabino Gasser with gastroenterology.   BRIEF HISTORY:  A 65 year old male patient presented with acute  respiratory distress, hypotension, neutropenia, thrombocytopenia, and  abnormal liver function tests, along with metabolic acidosis and  confusion.  He was given the diagnosis of severe sepsis, septic shock  without clear etiology.  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  Mr. Debarr  was admitted to the intensive care for the above-mentioned complaints.   HOSPITAL COURSE BY DISCHARGE DIAGNOSES:  1. Acute hepatic failure secondary to Augmentin, complicated by      presumed sepsis, septic shock, shock liver, and organ      hypoperfusion.  As previously mentioned, Mr. Giammarino  was admitted to      the intensive care, therapy was initiated in the usual fashion      including early goal-directed therapy protocol consisting of      aggressive IV fluid resuscitation, vasoactive drips, and broad-      spectrum antibiotics.  Because of the uncertainty of source for      infection, infectious disease was consulted.  Multiple cultures      were obtained, none of which have identified a clear source.  He      completed 10 days of imipenem, as well as Cubicin as directed by      infectious disease.  To date, there is no identifiable source of      infection although he had been on prolonged antibiotic therapy      prior to hospital admission.  He did have a culture done of right      foot abscess/fluid collection; this did not demonstrate an      organism.  It was felt after evaluation by gastroenterology, as      well as infectious disease that likely his liver function test      elevation was  secondary to Augmentin and further complicated by      organ hypoperfusion.  This has continued to improve over the course      of his hospitalization.  2. Hepatic encephalopathy.  This is now resolved following cessation      of Augmentin and aggressive supportive care.  3. Sepsis, septic shock.  As mentioned above, therapy consisted of      aggressive end-organ support, vasoactive drips, aggressive      crystalloid supplementation, and admission to the ICU.  He was      critically ill for approximately 48 hours.  Eventually pressors      were weaned off and upon time of this dictation he is      hemodynamically stable.  4. Acute respiratory failure secondary to acute lung injury.  This is      felt to be secondary and as a complication of sepsis.  His chest x-      ray continues to improve, anticipate no long-term issues from a      pulmonary standpoint.  Currently working towards weaning off from      oxygen and focusing on rehabilitation.  5. Dysphagia.  Patient has had now 2 modified barium swallows      following intubation and mechanical ventilation.  He continues to      have objective dysphagia by modified barium swallow.  His panda      tube has been discontinued; he has strict aspiration precautions as      outlined by speech/language pathology.  He will require further      followup from a swallowing standpoint and perhaps require      replacement of panda feeding tube.  6. Hypertension.  For this, we will continue to control with current      medical regimen.  7. Positive cardiac enzymes.  At this point, he is hemodynamically      stable, no current role for further cardiac workup, could consider      cardiac stress test in outpatient setting.  8. Obstructive sleep apnea.  Plan for this is to continue nocturnal      CPAP.  9. Hypothyroidism.  Plan for this is to continue Synthroid.  10.Hyperglycemia.  Plan for this is continue sliding scale insulin.  11.Diarrhea.  This  has been ruled out as infective, GI has been      following, plan for this is to continue probiotic therapy, as well      as p.r.n. Lomotil.  12.Debility.  Plan for this is to admit to rehabilitation services      when bed available.   DISCHARGE MEDICATIONS:  Currently:  1. Coreg 25 mg via tube b.i.d.  2. Cubicin 500 mg IV every 24 hours.  3. Free water 250 mL every 4 hours.  4. Lasix 40 mg p.o. daily.  5. Neurontin 300 mg p.o. q. 12.  6. NovoLog insulin, blood glucose 101 to 150 equals 2 units, 15 to 200      equals 3 units, 201 to 250 equals 5 units, 251 to 300 equals 7      units, 301 to 350 equals 9 units, 351 to 400 equals 11 units,      greater than 400 equals call MD.  7. Boost supplementation 240 mL p.o. b.i.d.  8. Synthroid 150 mcg p.o. daily.  9. Multivitamin 5 mL p.o. daily.  10.Benicar 40 mg p.o. daily.  11.Potassium chloride 40 mEq daily.  12.Prednisone 10 mg daily.  13.Zantac 150 mg b.i.d.  14.Florastor 250 mg via tube b.i.d.  15.Sodium flush for PEG site p.r.n.  16.Thick-It Food Thickener.   DIET:  Currently, current recommendations by speech/language pathology  are for nectar-thick liquids only, no solids, he is to continue strict  aspiration precautions, and crush medications.   ACTIVITY:  Mr. Piatkowski is cleared for full rehabilitation services and is  now medically cleared for discharge to inpatient rehab.      Zenia Resides, NP      Leslye Peer, MD  Electronically Signed    PB/MEDQ  D:  11/06/2007  T:  11/06/2007  Job:  [id number]

## 2010-08-21 NOTE — Assessment & Plan Note (Signed)
[facility]                            CARDIOLOGY OFFICE NOTE   ELIJIO, STAPLES                       MRN:          [id number]  DATE:12/18/2006                            DOB:          23-Nov-1945    Mr. Craw returns today for careful follow up of his difficult to treat  hypertension. On his last visit, his blood pressure was 170/113 and  heart rate was 84.   We changed his Diovan to 320 mg once daily, changed his  hydrochlorothiazide component diuretic to Furosemide 40 mg p.o. q.a.m.  for some lower extremity edema as well, and added Carvedilol 12.5 mg  b.i.d.   His headaches have totally resolved. His face is not nearly as ruddy. He  has had some nausea in the evenings with his Carvedilol dose, but he is  feeling better. He denies any orthostatic symptoms.   VITAL SIGNS:  Blood pressure 116/62, pulse 74 and regular, weight 180.  His weight is down 5 pounds.  HEENT:  His face is not nearly as ruddy, otherwise normal.  NECK:  Carotids are full. There is no JVD.  LUNGS:  Clear.  HEART:  Regular rate and rhythm, no gallop.  ABDOMEN:  Soft.  EXTREMITIES:  No edema!  NEUROLOGIC:  Intact.   I am delighted with how Mr. Brindisi is doing. We have made no changes in  his program. We will check a chem-7 again today. I will see him back  again in 4 weeks.     Thomas C. Daleen Squibb, MD, [facility]  Electronically Signed    TCW/MedQ  DD: 12/18/2006  DT: 12/19/2006  Job #: [id number]   cc:   Areatha Keas, M.D.

## 2010-08-21 NOTE — Op Note (Signed)
Johnny Patrick, Johnny Patrick                ACCOUNT NO.:  [id number]   MEDICAL RECORD NO.:  [id number]          PATIENT TYPE:  AMB   LOCATION:  DSC                          FACILITY:  [facility]   PHYSICIAN:  Leonides Grills, M.D.     DATE OF BIRTH:  04-27-1945   DATE OF PROCEDURE:  04/08/2007  DATE OF DISCHARGE:                               OPERATIVE REPORT   PREOPERATIVE DIAGNOSES:  1. Right bunionette.  2. Right lateral foot bursa infectious?   POSTOPERATIVE DIAGNOSES:  1. Right bunionette.  2. Right lateral foot bursa infectious?   OPERATION:  1. Partial excision of right fifth metatarsal head.  2. Bursectomy of right lateral forefoot over fifth metatarsal head.  3. I&D to bone of right foot.   ANESTHESIA:  General.   SURGEON:  Leonides Grills, M.D.   ASSISTANT:  Evlyn Kanner, P.A.-C   ESTIMATED BLOOD LOSS:  Minimal.   TOURNIQUET TIME:  Approximately half hour.   COMPLICATIONS:  None.   CULTURES:  Aerobic and anaerobic were taken and specimen was sent to  pathology.   DISPOSITION:  Stable to PR.   INDICATIONS:  This is a 65 year old male who has had a large swollen  draining lesion on the lateral aspect of his right forefoot over the  fifth metatarsal head.  On x-ray, he had a significant 4.5  intermetatarsal angle with a prominent lateral aspect of the fifth  metatarsal head.  Due to the fact that this could be infectious in  nature, we did not do an osteotomy of the fifth metatarsal head and  elected to do a partial excision of the fifth metatarsal head with a  bursectomy and possibly later staging this for a fifth metatarsal varus  osteotomy.  We went over the risks of procedure which included  infection, nerve or vessel injury, persistent pain.  Recurrence of the  bursa and possibility of future surgery for a fifth metatarsal osteotomy  again were all explained.  Questions were encouraged and answered.  I  also explained to him that he could develop osteomyelitis of the  fifth  metatarsal head and undergo a fifth ray amputation.   OPERATION:  The patient was brought to the operating room and placed in  supine position initially.  After adequate general endotracheal  anesthesia was administered.  Ancef was held.  A bump was placed under  the right lateral hip internally rotating the right lower extremity and  the right lower extremity was prepped and draped in sterile manner over  a proximally placed a thigh tourniquet.  The limb was __________  exsanguinated and tourniquet was elevated to 209 mm Hg.  A longitudinal  incision over the dorsolateral aspect of the right fifth metatarsal head  MTP joint region was then made.  Dissection was carried down through  skin.  Hemostasis was obtained.  There was actually a pocket of air in  this area, almost like puncturing a balloon.  There was a large bursa in  this area.  This was carefully dissected out with scissors and this went  plantarly.  This was quite large.  Once this was completely carefully  dissected out, this was sent to pathology and cultures were obtained.  Ancef was given at this point.   A longitudinal capsulotomy was then made over the lateral aspect of the  fifth metatarsal head and the head was then exposed.  Using a sagittal  saw, the lateral aspect of the head as well as the plantar aspect of the  head was then removed.  Once this was done, this was rounded off with a  rongeur and there was no prominence to the head.  The area was copiously  irrigated with normal saline.  Capsule was repaired with 3-0 Vicryl  stitch and the skin was repaired to the lateral aspect of the capsule  with 3-0 Vicryl stitch as well to try to get rid of the dead space.  The  subcutaneous layer was closed with 3-0 Vicryl and skin was closed with 4-  0 nylon.  Sterile dressing was applied.  A Darco shoe was applied.  The  patient was stable to PR.      Leonides Grills, M.D.  Electronically Signed     PB/MEDQ   D:  04/08/2007  T:  04/08/2007  Job:  [id number]

## 2010-08-21 NOTE — Op Note (Signed)
Johnny Patrick, Johnny Patrick                ACCOUNT NO.:  [id number]   MEDICAL RECORD NO.:  [id number]          PATIENT TYPE:  AMB   LOCATION:  DSC                          FACILITY:  [facility]   PHYSICIAN:  Leonides Grills, M.D.     DATE OF BIRTH:  1945/05/10   DATE OF PROCEDURE:  09/08/2007  DATE OF DISCHARGE:                               OPERATIVE REPORT   PREOPERATIVE DIAGNOSES:  1. Right foot abscess with plantar foot ulcer.  2. Right fifth metatarsal osteomyelitis.   POSTOPERATIVE DIAGNOSES:  1. Right foot abscess with plantar foot ulcer.  2. Right fifth metatarsal osteomyelitis.   OPERATION:  1. I&D to bone, right foot.  2. Partial resection of right fifth metatarsal shaft.   ANESTHESIA:  General.   SURGEON:  Leonides Grills, MD.   ASSISTANT:  Richardean Canal, PA-C   ESTIMATED BLOOD LOSS:  Minimal.   TOURNIQUET TIME:  Approximately half hour.   COMPLICATIONS:  None.   DISPOSITION:  Stable to PR.   INDICATIONS:  This is a 65 year old male who has developed what he  thought was a blister.  The blister popped and there was purulent  material that was expressed.  This was cultured and was found to be  staph aureus that was sensitive to all antibiotics except for  penicillin.  He had a subsequent x-ray which showed osteomyelitic  changes of the fifth metatarsal head, neck, and distal portion of the  shaft.  He was consented to the above procedure.  All risks which  occlude persistent infection or nerve vessel injury, possibility that we  may need to resect more bone, and possibility that the infection may  persist were all explained, questions were encouraged and answered.   OPERATION IN DETAIL:  The patient was brought to the operating room and  placed in supine position after adequate general endotracheal tube  anesthesia was administered and the patient was already on vancomycin 1  gram IV piggyback and he had his PICC line placed yesterday.  We then  placed a bump under the right  ipsilateral hip internally rotating the  right lower extremity.  The right lower extremity was then prepped and  draped in sterile manner.  Over the proximally placed thigh tourniquet,  limb was gravity exsanguinated.  Tourniquet was elevated to 290 mmHg.  A  longitudinal incision over the dorsolateral aspect of the right fifth  metatarsal was then made.  A full-thickness incision was then made.  There was no purulent material in the entire area.  The fifth metatarsal  head, neck, and the distal aspect of the shaft were spongy in nature.  However, there was no distinct purulent material within the bone.  With  a sagittal saw, this was then cut.  From distal dorsal to plantar  proximal the bone was resected.  This was then sent to pathology.  The  area was copiously irrigated with normal saline.  The areas of muscle  and ligament that appeared to be diseased were resected and once again  the area was copiously irrigated with normal saline.  We then advanced  the  abductor digiti quinti dorsally and repaired this with 3-0 Vicryl  stitch covering the distal aspect of the shaft and removed the dead  space in this area.  Tourniquet was deflated.  Hemostasis was obtained.  There was no pulsatile bleeding.  Skin was closed with 4-0 nylon stitch.  Sterile dressing was applied.  Hard sole shoe was applied.  The patient  was stable to PR.      Leonides Grills, M.D.  Electronically Signed     PB/MEDQ  D:  09/08/2007  T:  09/09/2007  Job:  [id number]

## 2010-08-21 NOTE — Assessment & Plan Note (Signed)
[facility]                            CARDIOLOGY OFFICE NOTE   Johnny, Patrick                       MRN:          [id number]  DATE:12/24/2007                            DOB:          12-07-1945    Johnny Patrick comes in today for followup.  He had a very prolonged  hospitalization for sepsis.  Please see comprehensive discharge  summaries and rehabilitation reports for details.   Since discharge, his blood pressure running about 140/80, his wife is a  Engineer, civil (consulting).  He has been having some increased lower extremity edema, but  that has gotten better, now these mobilizing a little bit and he is back  on furosemide 40 mg a day.  He is on Carvedilol 12.5 b.i.d..   His last blood work showed a creatinine of 0.81 in August.  Potassium  was 4.3.  He is no longer on his Diovan.  This was stopped in the  hospital when he was so hypotensive and septic.   MEDICATIONS:  1. Iron 325 mg p.o. b.i.d.  2. Protonix 20 mg a day.  3. Potassium 20 mEq a day.  4. Duragesic patch 12 mcg q.72 h.  5. Restoril 15 mg nightly.  6. Carvedilol 12.5 mg p.o. b.i.d.  7. CPAP.  8. Synthroid 150 mcg a day.  9. Prednisone 10 mg a day.  10.Symbicort.  11.Furosemide 40 mg a day.  12.Arava for his psoriatic arthritis.   PHYSICAL EXAMINATION:  GENERAL:  He looks pretty good concerning what he  has been through.  SKIN:  Warm and dry.  He still has ruddy complexion.  HEENT:  Otherwise, is normal.  He has a little bit of the steroid  facies.  VITAL SIGNS:  Blood pressure 142/88.  His weight is 189 down 3.  NECK:  Carotids upstrokes were equal bilaterally without bruits.  No  JVD.  Thyroid is not enlarged.  Trachea is midline.  LUNGS:  Clear to auscultation to percussion.  HEART:  Nondisplaced PMI.  Normal S1 and S2.  ABDOMEN:  Protuberant with good bowel sounds.  EXTREMITIES:  A 1+ edema except his left ankle, which has been  previously crushed where it is 2+/4+.  Pulses are intact  and he is at  baseline.  There is no DVT.  NEUROLOGIC:  Grossly intact.   ASSESSMENT AND PLAN:  [name] is recovering from his very serious  acute illness as noted above.  I suspect he needs go back on his Diovan  for his hypertension.  We check a Chem-7 and his creatinine and  potassium was stable, as they were in August.  We will proceed  with adding this back if his pressures is remaining greater than 140  systolic.  I will see him back again in 3 months.     Thomas C. Daleen Squibb, MD, [facility]  Electronically Signed    TCW/MedQ  DD: 12/24/2007  DT: 12/25/2007  Job #: [id number]

## 2010-08-21 NOTE — Consult Note (Signed)
NAMENORVAL, SLAVEN                ACCOUNT NO.:  [id number]   MEDICAL RECORD NO.:  [id number]          PATIENT TYPE:  INP   LOCATION:  1234                         FACILITY:  [facility]   PHYSICIAN:  Genene Churn. Granfortuna, M.D.DATE OF BIRTH:  07-Dec-1945   DATE OF CONSULTATION:  10/27/2007  DATE OF DISCHARGE:                                 CONSULTATION   REASON FOR CONSULTATION:  This is a Hematology consultation requested to  evaluate this man for pancytopenia.   HISTORY OF PRESENT ILLNESS:  Mr. Dehart is a 65 year old man with a  longstanding history of psoriatic arthritis on chronic immunosuppressive  medication.  He has taken Arava for over 10 years.  He is on chronic  prednisone, currently 10 mg daily.  In addition to the psoriatic  arthritis.  He has advanced degenerative arthritis and is status post L3-  4, L4-5, S1, laminectomies with lumbar fusion an Ray cage stabilization  in May 2003 with a repeat spinal procedure in July 2005.  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   He was admitted here on September 08, 2007, with a right foot abscess, a  plantar foot ulcer, and a right fifth metatarsal bone osteomyelitis.  He  underwent incision and drainage down to the bone and partial resection  of the right fifth metatarsal bone by Dr. Lestine Box..  He was started on  parenteral vancomycin given through a PICC catheter and received an  approximate 14-day course.  He was then put on Augmentin which he took  for 30 days and just completed a day before the current hospital  admission.   He presented on 10/26/2007 with a 24 to 48-hour history of intermittent  confusion and progressive lethargy.  He was sitting in his Lazy Boy  chair and tried to get up, but the chair jackknifed and he fell on the  floor.  He did not lose consciousness.  He seemed to be  okay.  When his  wife checked him at few hours later, he had not moved from the chair all  afternoon.  He was lethargic and did not want to eat.  On the next day,  he was intermittently confused and saying inappropriate things that made  the family concerned, and they brought him to the emergency department.  He was not noted to have a fever, no chills.  No cough, no dyspnea.  No  chest pain.   On initial exam in the emergency department, temperature was 101.1  degrees.  Subsequently he has been afebrile.  A CT scan of the brain  showed no gross abnormalities and specifically no evidence for subdural  hematoma formation.  A chest radiograph normal.  A CT scan of his left  hip shows previous changes of avascular necrosis with collapse and  fragmentation of the left femoral head.  There are some fluid  collections in the joint, most of these have decreased compared with a  May  29 MRI, but radiologist cannot definitively exclude possibility of a  joint abscess.   On admission labs showed hemoglobin 10.7, hematocrit 31, MCV 87.5, white  count 2600, 87% neutrophil, 8 lymphocytes, 5 monocytes and platelet  count 63,000.  A repeat CBC today with hemoglobin 11, white count 2200,  87% neutrophils and platelets 49,000.  I called Dr. Renaldo Reel office, and  they were kind enough to fax me labs done in their office August 06, 2007, which at that time showed a hemoglobin 11, hematocrit 33, white  count 5100, 78% neutrophil, 13 lymphocytes and platelet count 244,000.   Additional abnormal labs done during this admission showed new liver  function abnormalities with bilirubin 1.2, alkaline phosphatase 408,  SGOT 295, SGPT 178.  Liver functions in Dr. Renaldo Reel office done also on  August 06, 2007, were normal with a bilirubin 0.3, alkaline phosphatase  72, SGOT 16, SGPT 21.   A protime is 13.5 seconds, PTT 41, D - dimer elevated at greater than  20.   PAST MEDICAL HISTORY:  As noted above.  Also  includes:  1. Hypertension.  2. Hypothyroidism.  3. GERD.  4. Hiatus hernia.  5. Renal stones with remote history of lithotripsy.  6. Status post excision of an inflammatory mass on the right wrist by      Dr. Margaree Mackintosh in February 2008.  7. No history of MI and diabetes, peptic ulcers, hepatitis, yellow      jaundice, seizure, stroke, blood clots or prior known bleeding      problems.  8. Sleep apnea syndrome on CPAP.   PREHOSPITAL MEDICATIONS:  Coreg, digoxin, fluconazole, Lasix, Neurontin,  prednisone, Synthroid, Arava, Endocet, cyclobenzaprine, calcium,  multivitamin, Diovan, and as noted above, recent 30-day course of  Augmentin following a 14-day course of IV vancomycin.   ALLERGIES:  No allergies.   FAMILY HISTORY:  He had one brother who died last year of an MI at age  74.   SOCIAL HISTORY:  Married, one daughter who is healthy.  No alcohol or  tobacco.   REVIEW OF SYSTEMS:  As noted above.  In addition, he denies any dyspnea,  cough, chest pain, palpitations, nausea, vomiting or diarrhea (he  developed diarrhea since he has been in the hospital).  No dysphagia.  No hematuria, gum bleeding, epistaxis, hematochezia or melena.  Chronic  pain in the left hip, chronic swelling left lower extremity, chronic  disability of the left leg.  He has been only semi-ambulatory with a  walker for the last few months due to the left hip problem.   PHYSICAL EXAMINATION:  GENERAL:  A well-nourished Caucasian man who is  alert and oriented.  VITAL SIGNS:  Pulse is 130 and regular, blood pressure 147/98,  respirations 40 at rest, temperature 97.8, oxygen saturation greater  than equal to 97% on 4 liters of nasal cannula oxygen.  SKIN:  No ecchymosis, petechiae or rash.  Pupils equal, reactive to  light.  Pharynx no erythema or exudate.  NECK:  Supple.  LUNGS:  Clear.  HEART:  Regular cardiac rhythm.  No murmur, rapid rate.  No adenopathy.  ABDOMEN:  Soft, moderately distended,  nontender, tympanitic to  percussion.  No obvious mass or organomegaly.  EXTREMITIES:  With asymmetric pedal edema, 2+ on the left, 1+ on the  right.  NEUROLOGIC:  Poor ability to move his left lower extremity.  He is  otherwise alert and oriented.  Cranial nerves appear grossly intact.   Review of the  peripheral blood film shows normochromic, normocytic red  cells.  No schistocytes or spherocytes.  Normal appearing neutrophils,  but decreased number.  A rare early myeloid form seen.  No blasts  Platelets are decreased 5-8 per high-power field.   IMPRESSION:  Pancytopenia, likely multifactorial.   Some of his medications could have certainly caused the pancytopenia, in  particular, the Arava, vancomycin and a prolonged course of Augmentin.  The fall in his counts are acute compared with outpatient lab done in  Dr. Renaldo Reel office on August 06, 2007.  This makes it even more likely  that this is a medication-induced phenomenon.   I believe that he is also septic and that we are also looking at a  contribution from infection/low grade DIC.  His symptoms of intermittent  confusion, tachycardia, tachypnea, in the absence of any obvious pain or  hypoxia and with a low probability lung scan done during this admission  together with the elevated D-dimer in the absence of recent surgery in a  severely immunocompromised host on chronic steroids and Arava make  underlying infection likely.  Source is not clear at present but he has  new liver function abnormalities and you are appropriately evaluating  this with an ultrasound today.   I doubt very much that he has an underlying hematologic malignancy or  bone marrow disorder.   RECOMMENDATIONS:  I agree with holding immunosuppressive drugs,  particularly the Arava, I agree with blood cultures and antibiotics.  He  may need stress dosed steroids.  If there is a further fall in his white  count, I would use broader spectrum antibiotic coverage.   Evaluation of  his liver function abnormalities in progress.   Thank you for this consultation.      Genene Churn. Cyndie Chime, M.D.  Electronically Signed     JMG/MEDQ  D:  10/27/2007  T:  10/27/2007  Job:  [id number]   cc:   Elliot Cousin, M.D.   Areatha Keas, M.D.  Fax: [phone]   Claude Manges. Cleophas Dunker, M.D.  Fax: [phone]   Clinton D. Maple Hudson, MD, FCCP, FACP  [facility]  [address]  [place]  [place] [address]   Jesse Sans. Wall, MD, FACC  [address]  [place]  [place] [address]   Payton Doughty, M.D.  Fax: [phone]

## 2010-08-21 NOTE — Op Note (Signed)
NAMEKALEM, ROCKWELL                ACCOUNT NO.:  [id number]   MEDICAL RECORD NO.:  [id number]          PATIENT TYPE:  INP   LOCATION:  2023                         FACILITY:  [facility]   PHYSICIAN:  Claude Manges. Whitfield, M.D.DATE OF BIRTH:  11/11/1945   DATE OF PROCEDURE:  02/16/2008  DATE OF DISCHARGE:                               OPERATIVE REPORT   PREOPERATIVE DIAGNOSIS:  Avascular necrosis, left hip.   POSTOPERATIVE DIAGNOSIS:  Avascular necrosis, left hip.   PROCEDURE:  Left total hip replacement.   SURGEON:  Claude Manges. Cleophas Dunker, MD   ASSISTANTS:  1. Lenard Galloway. Chaney Malling, MD  2. Oris Drone Petrarca, PA-C   ANESTHESIA:  General.   COMPLICATIONS:  None.   COMPONENTS:  DePuy AML 16.5 mm standard femoral component, a 36 mm hip  ball with a +5 neck length, 58 mm outer diameter, 100 series metallic  acetabular component with a marathon polyethylene liner with a 10-degree  posterior lip.  All were press fit.   PROCEDURE:  The patient was met in the holding area.  Any questions were  answered.  The left lower extremity was marked as the appropriate  operative site.  The patient was then transferred to room #4 and placed  under general orotracheal anesthesia by Dr. Jacklynn Bue.  A Foley catheter  was inserted by the nursing staff.  The urine was clear.  The patient  was then placed in the lateral decubitus position with the left side up  and secured to the operating table with the Innomed hip system.   The left hip was then prepped with Betadine scrub and then DuraPrep from  the iliac crest to below the knee.  Sterile draping was performed.   A routine southern incision was utilized and via sharp dissection  carried down to subcutaneous tissue.  Gross bleeders were Bovie  coagulated.  Self-retaining retractors were inserted.  Adipose tissue  was incised to the level of the iliotibial band.  Retractors were placed  more deeply.  The iliotibial band was then incised along length of the  skin incision with the hip internally rotated.  The short external  rotators were identified and carefully incised in their attachment to  the posterior greater trochanter.  Tendinous structures were tagged with  0 Ethibond suture.   The capsule was then identified and incised along the femoral neck and  head to the level of the acetabulum.  There was serosanguineous fluid,  which was sent for anaerobic and aerobic culture.  This had been  cultured preoperatively and revealed no growth.   Capsule was then opened.  There was abundant synovitis.  Synovectomy was  performed.  Specimens were sent for the frozen section and final  sectioning.  The frozen section revealed no evidence of increased white  blood cells, i.e. without evidence of infection.   The head was nearly completely dissolved.  There are bits of bone and  cartilage within the joint to the level of the femoral neck.  These were  sharply debrided and removed any bleeding was controlled with the Bovie.   The femoral neck was  then delivered into the wound.  A starter reamer  hole was then made with a power reamer.  Reaming was then performed  sequentially to 16 mm to accept a 16.5 component.  Rasping was performed  sequentially to 16.5 and about 15 degrees of anteversion.  The level of  the neck osteotomy was about a fingerbreadth proximal to the lesser  trochanter.  A calcar reamer was used to fine-tune the calcar angle.   The retractor was then placed about the acetabulum.  Any further  synovitis was removed.  Reaming was performed sequentially to 57 to  accept a 58-mm component.  We had a very nice circumferential bleeding.  We trialed a 56 and then a 58 component.  We are able to see the 56 with  a tight rim fit.  The 58 would not completely seat.   The wound was then copiously irrigated with saline solution.  The 100  series 58-mm odd diameter metallic acetabular component was then  impacted.  The trial polyethylene  component was then inserted.   The 16.5 mm femoral component was then impacted and flushed on the  calcar.  We trialed several neck lengths with a 36 mm hip ball felt that  the 7.5 ball reestablished leg lengths as the patient was approximately  an inch short preoperatively and had perfect stability.  We then again  trialed the +5 and thought that that was probably less tight in  extension and still maintained perfect stability.   The trial components were then removed.  The wound was again irrigated.  The final marathon polyethylene liner was then impacted into the  acetabulum.  The wound was again irrigated.  The 16.5 mm femoral stem  was then impacted and flushed on the calcar maintaining about 15 degrees  of anteversion.  We then trialed the 1.5 and the 5 mm neck length 36 mm  hip ball felt that the +5 provided perfect stability and reestablished  leg lengths.  Accordingly, we cleaned the Morris taper neck and the  final 36 mm hip ball with a 5 mm neck length was then impacted.  The  entire construct was reduced after irrigating the acetabulum, had  perfect stability.  We could not dislocate the hip in flexion or  extension.   The capsule was then closed with interrupted #1 Ethibond.  The short  external rotators were closed with similar material.  Iliotibial band  was closed with a running 0 Vicryl, the subcu closed in two layers of 0  and 2-0 Vicryl, skin closed with skin clips.  Sterile bulky dressing was  applied.  The patient was then awakened and returned to the  postanesthesia recovery room in satisfactory condition.      Claude Manges. Cleophas Dunker, M.D.  Electronically Signed     PWW/MEDQ  D:  02/16/2008  T:  02/16/2008  Job:  [id number]

## 2010-08-23 ENCOUNTER — Ambulatory Visit (INDEPENDENT_AMBULATORY_CARE_PROVIDER_SITE_OTHER): Payer: Medicare Other | Admitting: Internal Medicine

## 2010-08-23 ENCOUNTER — Encounter: Payer: Self-pay | Admitting: Internal Medicine

## 2010-08-23 ENCOUNTER — Ambulatory Visit (INDEPENDENT_AMBULATORY_CARE_PROVIDER_SITE_OTHER)
Admission: RE | Admit: 2010-08-23 | Discharge: 2010-08-23 | Disposition: A | Discharge status: Home / Self Care | Payer: Medicare Other | Source: Ambulatory Visit | Attending: Internal Medicine | Admitting: Internal Medicine

## 2010-08-23 VITALS — BP 142/88 | HR 71 | Ht 66.0 in | Wt 158.6 lb

## 2010-08-23 DIAGNOSIS — J449 Chronic obstructive pulmonary disease, unspecified: Secondary | ICD-10-CM

## 2010-08-23 DIAGNOSIS — J984 Other disorders of lung: Secondary | ICD-10-CM

## 2010-08-23 DIAGNOSIS — G4733 Obstructive sleep apnea (adult) (pediatric): Secondary | ICD-10-CM

## 2010-08-23 NOTE — Assessment & Plan Note (Signed)
We had followed an inflammatory process- possibly atypical pneumonia. At last imaging it was substantially improved. We will update CXR.

## 2010-08-23 NOTE — Assessment & Plan Note (Addendum)
CPAP 12, [employer]. We discussed effect of weight loss. Generally good compliance and control.

## 2010-08-23 NOTE — Progress Notes (Signed)
  Subjective:    Patient ID: Johnny Patrick, male    DOB: 06-26-1945, 65 y.o.   MRN: [id number]  HPI 08/23/10-65 yoM former smoker followed for COPD with hx cryptococcosis, OSA, allergic rhinitis, hx lung nodule, GERD, psoriatic arthritis, degenerative disk disease Last here February 20, 2010 after rods removed from spine. Continues f/u for anemia of chronic disease.  Since last here chronic cough has largely resolved.  Breathing now feels normal w/o cough, wheeze or shortness of breath. He does tire easily and is unsteady with rods out, so uses cart to ride at store. Discussed last CXR 6/ 2011.Marland Kitchen  Review of Systems Constitutional:   No weight loss, night sweats,  Fevers, chills, fatigue, lassitude. HEENT:   No headaches,  Difficulty swallowing,  Tooth/dental problems,  Sore throat,                No sneezing, itching, ear ache, nasal congestion, post nasal drip,   CV:  No chest pain,  Orthopnea, PND, swelling in lower extremities, anasarca, dizziness, palpitations  GI  No heartburn, indigestion, abdominal pain, nausea, vomiting, diarrhea, change in bowel habits, loss of appetite  Resp: No shortness of breath with exertion or at rest, but very limited.  No excess mucus, no productive cough,  No non-productive cough,  No coughing up of blood.  No change in color of mucus.  No wheezing.    Skin: no rash or lesions.  GU: no dysuria, change in color of urine, no urgency or frequency.  No flank pain.  MS: Per HPI.  Psych:  No change in mood or affect. No depression or anxiety.  No memory loss.     Objective:   Physical Exam General- Alert, Oriented, Affect-appropriate, Distress- none acute  Appears comforta ble  Skin- rash-none, lesions- none, excoriation- none  Lymphadenopathy- none  Head- atraumatic  Eyes- Gross vision intact, PERRLA, conjunctivae clear, secretions  Ears- Hearing, canals, Tm- normal  Nose- Clear, No- Septal dev, mucus, polyps, erosion, perforation   Throat-  Mallampati II , mucosa clear , drainage- none, tonsils- atrophic  Neck- flexible , trachea midline, no stridor , thyroid nl, carotid no bruit  Chest - symmetrical excursion , unlabored   kyphotic     Heart/CV- RRR , no murmur , no gallop  , no rub, nl s1 s2                     - JVD- none , edema- none, stasis changes- none, varices- none     Lung- clear to P&A, wheeze- none, cough- none , dullness-none, rub- none     Chest wall-  Abd- tender-no, distended-no, bowel sounds-present, HSM- no  Br/ Gen/ Rectal- Not done, not indicated  Extrem- cyanosis- none, clubbing, none, atrophy- none, strength- nl  Neuro- grossly intact to observation         Assessment & Plan:

## 2010-08-23 NOTE — Patient Instructions (Signed)
Order- CXR- dx lung nodule

## 2010-08-23 NOTE — Assessment & Plan Note (Addendum)
Clinically stronger and doesn't feel limited by his breathing, given his complicated medical history. Lack of spine stability affects breathing dynamics, ability to cough, and endurance.

## 2010-08-24 NOTE — Discharge Summary (Signed)
NAMELENVILLE, HIBBERD                ACCOUNT NO.:  [id number]   MEDICAL RECORD NO.:  [id number]          PATIENT TYPE:  INP   LOCATION:  5023                         FACILITY:  [facility]   PHYSICIAN:  Claude Manges. Whitfield, M.D.DATE OF BIRTH:  Aug 12, 1945   DATE OF ADMISSION:  02/16/2008  DATE OF DISCHARGE:  02/20/2008                               DISCHARGE SUMMARY   ADMISSION DIAGNOSIS:  Avascular necrosis of the left hip.   DISCHARGE DIAGNOSES:  1. Avascular necrosis of the left hip.  2. Posthemorrhagic anemia.  3. Hypertension.  4. Gastroesophageal reflux disease.  5. Sleep apnea.   PROCEDURE:  Left total hip replacement.   HISTORY:  A 65 year old white male with longstanding history of  avascular necrosis of left hip.  He was seen in June 2009 after being  evaluated for an infective foot on the right leg.  He was hospitalized  in Summer for possible sepsis and was in grave condition on a  respirator.  He survived and now has complete collapse of left femoral  head and would like to proceed and consider total hip arthroplasty.  He  is admitted at this time for total hip arthroplasty.   HOSPITAL COURSE:  A 65 year old male admitted on February 16, 2008.  After appropriate laboratory studies were obtained as well as Ancef per  pharmacy dosing and Solu-Cortef per Anesthesia, the patient was taken to  the operating room where he underwent a left total hip arthroplasty.  He  tolerated the procedure well.  He was started on Coumadin per protocol.  Lovenox 40 mg subcu beginning at 10:00 p.m. on the night of surgery was  ordered.  IV normal saline was started postoperatively.  A Dilaudid PCA  pump was ordered with a full dose.  Ancef was continued per pharmacy  dosing for 3 doses.  Solu-Cortef 100 mg IV q.8 p.m. on the night of  surgery and then began regular prednisone dosing in the morning.  A  Foley was placed intraoperatively.  Consultations with PT, OT, and Care  Management were made.   May be partial weightbearing 50% on the hip.  His  vancomycin was dosed to 1500 mg IV q.24 h.  He was initially in a  telemetry bed but did very well.  He was transferred to 5000 on November  11, as bed was available.  On November 12, his PCA was discontinued.  His IV was saline locked.  Genevieve Norlander for home health.  On November 12, he  was transfused with 2 units of packed cells.  Remainder of his hospital  course was uneventful, and he was discharged on February 20, 2008, to  follow back up with [place] in the office.  EKG was read as normal sinus  rhythm with left axis deviation, abnormal EKG.  Since last tracing,  anterolateral T-wave inversion no longer persists.  Portable left hip of  February 16, 2008, reveals status post left total hip arthroplasty  without evidence of claudication.   LABORATORY STUDIES:  Admitted with a hemoglobin 12.7, hematocrit 38.4,  white count 6200, and platelets 276,000.  He dropped his  hemoglobin to  8.5 and 25.4 on November 12.  Given 2 units of packed cells.  Discharge  hemoglobin 10.5, hematocrit 31.1%, white count 6400, and platelets  204,000.  His sed rate was 24.  Protime preop 12.4, INR 0.9, and PTT was  25.  Discharge protime 17.6 and INR 1.4.  Preop sodium 137, potassium  4.7, chloride 104, CO2 of 24, glucose 160, BUN 23, and creatinine 0.79.  Discharge sodium 139, potassium 4.8, chloride 106, CO2 of 28, glucose  123, BUN 10, and creatinine 1.25.  GFR was greater than 60  preoperatively.  At discharge, it was 4.  Preop total protein 6.3,  albumin 3.6, AST 25, ALT 28, ALP 75, and total bilirubin 0.6.  CRP was  0.4.  Urinalysis benign for voided urine.  Blood type was O+.  Antibody  screen negative.  Blood culture showed no growth after 5 days x2.  Fluid  from the left hip revealed no growth x3 days.  Tissue and bone revealed  no organisms.  Urine culture on November 4, revealed no growth.  Tissue  cultures of November 10, revealed no growth x3 days.  No  anaerobes were  noted.   DISCHARGE INSTRUCTIONS:  Heart-healthy diet.  Increase his activity  slowly.  No lifting or driving for 6 weeks.  May shower on Sunday.  Use  his walker, 50% weightbearing as taught.  Follow hip precautions.   MEDICATIONS:  1. Percocet 5/325 one to tabs every 4 hours as needed for pain.  2. Robaxin 500 mg 1 tablet every 6 hours as needed for spasms.  3. Coumadin 5 mg instructed by home health.  4. Lovenox 40 mg subcu daily x3 days, stop if Coumadin is therapeutic.  5. Colace 100 mg twice a day.  6. MiraLax 17 g in 8 ounces of water as needed for constipation.   He was discharged improved condition.      Oris Drone Petrarca, P.A.-C.      Claude Manges. Cleophas Dunker, M.D.  Electronically Signed    BDP/MEDQ  D:  04/19/2008  T:  04/20/2008  Job:  [id number]

## 2010-08-24 NOTE — Assessment & Plan Note (Signed)
[facility]                             PULMONARY OFFICE NOTE   NAME:Johnny Patrick, Johnny Patrick                       MRN:          [id number]  DATE:07/22/2006                            DOB:          [birth year]    PROBLEM LIST:  1. Rhinitis.  2. Bronchitis.  3. Esophageal reflux with hiatal hernia.  4. Obstructive sleep apnea.   HISTORY:  He says he had a fungus infection causing a knot on the medial  aspect of his left arm resected by Dr. Teressa Senter and referred to Dr. Maurice March  and now cleared. He is on chronic prednisone for his arthritis which  apparently was the basis for that because he is not diabetic. Occasional  hot flashes may also be a hormone effect from the steroids and his blood  pressure medications. No definite fever. He is okay with chest pap and  getting gradually more used to it all the time. He had a chest x-ray as  an outpatient in February and thinks the results was okay. He denies  routine cough.   MEDICATIONS:  1. Fosamax.  2. Potassium.  3. Synthroid 15 mcg.  4. Diovan HCT 160/25 b.i.d.  5. Prednisone 5 mg.  6. Methotrexate 20 mg.  7. Cyclobenzaprine 10 mg q.8h p.r.n.  8. CPAP at 10 CWP.  9. Symbicort 160/4.5.   No medication allergy.   OBJECTIVE:  VITAL SIGNS:  Weight 195 pounds, blood pressure 138/84,  pulse regular 87, room air saturation 96%.  LUNGS:  There is a trace of crackle in both lung bases, otherwise lungs  are clear and unlabored.  HEENT:  Nasal airway is not obstructed, there is no post nasal drip. I  do not find adenopathy or edema.  HEART:  Heart sounds are without murmur or gallop.   IMPRESSION:  1. Sleep apnea is doing fairly well with CPAP and we will continue at      10.  2. Chronic rhinitis and bronchitis.  3. Recent fundus infection on his arm of uncertain type.   PLAN:  1. Will try to find his chest x-ray report.  2. If he is feeling well during the summer weather, he can reduce his      Symbicort to  1 puff b.i.d. as discussed.  3. Schedule return in 6 months but earlier p.r.n.     Clinton D. Maple Hudson, MD, Tonny Bollman, FACP  Electronically Signed    CDY/MedQ  DD: 07/22/2006  DT: 07/23/2006  Job #: [id number]   cc:   Areatha Keas, M.D.  Petra Kuba, M.D.

## 2010-08-24 NOTE — Op Note (Signed)
[facility]  Patient:    Johnny Patrick, Johnny Patrick Visit Number: [id number] MRN: [id number]          Service Type: SUR Location: 3000 [id number] [id number] Attending Physician:  Emeterio Reeve Dictated by:   Payton Doughty, M.D. Proc. Date: 08/25/01 Admit Date:  08/25/2001                             Operative Report  PREOPERATIVE DIAGNOSIS:  Severe lumbar stenosis at L3-4, L4-5 and L5-S1.  POSTOPERATIVE DIAGNOSIS:  Severe lumbar stenosis at L3-4, L4-5 and L5-S1.  OPERATION PERFORMED:  L3-4, L4-5, L5-S1 laminectomy, diskectomy and posterior lumbar interbody fusion with Ray threaded fusion cages and posterolateral arthrodesis with pedicle screws at 3-4, 4-5 and 5-1.  SURGEON:  Payton Doughty, M.D.  ANESTHESIA:  General endotracheal.  PREP:  Sterile Betadine prep and scrub with alcohol wipe.  COMPLICATIONS:  None.  ASSISTANT: 1. Danae Orleans. Venetia Maxon, M.D. 2. [place].  DESCRIPTION OF PROCEDURE:  The patient is a 65 year old right-handed white male with severe lumbar spondylosis.  The patient was taken to the operating room, smoothly anesthetized and intubated, and placed prone on the operating table.  Following shave, prep and drape in the usual sterile fashion the skin was infiltrated with 1% lidocaine with 1:400,000 epinephrine.  The skin was incised from mid-L1 to the bottom of L2 and the lamina of L3, L4, L5 and S1 were exposed bilaterally out over the facet joints and the transverse processes of 3, 4, 5 and the sacral ala were also exposed.  Intraoperative x-ray confirmed correctness of level.  The pars interarticularis, lamina and inferior facet of L3, L4, and L5 and the superior facet of L4, L5 and S1 were removed bilaterally.  These joints were quite hypertrophic and nearly ankylosed with virtually no articular cartilage remaining within them. Following complete removal of these facet complexes, the ligamentum flavum was removed and then 3, 4, 5, and S1 roots  exposed bilaterally.  Diskectomy was then carried out at 3-4, 4-5 and 5-1.  At 5-1 there was virtually no disk space left.  The bones extremely close to one another.  The L5 roots were very tightly compressed between the L5 and S1 pedicles.  Following diskectomy Ray threaded fusion cages were placed 12 x 21 mm bilaterally at each level.  At 5-1 because of severe osteoporosis, one became directed into the L5 vertebral body, but it still had contact with the S1 superior end plate.  The nerve roots were well decompressed and were carefully protected during placement of the Ray cages.  Pedicle screws were then placed using standard landmarks at L3, L4, L5 and S1.  Intraoperative x-ray showed good placement of pedicle screws, Ray cages with the exception of the right-sided L5-S1 cage which did get into the L5 vertebral body.  Connecting rods were then placed and capped and locked.  The wound was irrigated, hemostasis assured.  The Ray cages had been filled with bone graft harvested from the facet joints and the remaining bone was laid out over the transverse processes.  The wound was once again irrigated, hemostasis assured.  The fascia was reapproximated with 0 Vicryl in interrupted fashion.  Subcutaneous tissues were reapproximated with 0 Vicryl in interrupted fashion.  Subcuticular tissues were reapproximated with 3-0 Vicryl in interrupted fashion.  The skin was closed with 3-0 nylon in running locked fashion.  Bacitracin Telfa dressing was applied and made occlusive  with Op-Site.  The patient was then returned to the recovery room in good condition. Dictated by:   Payton Doughty, M.D. Attending Physician:  Emeterio Reeve DD:  08/25/01 TD:  08/27/01 Job: [id number]

## 2010-08-24 NOTE — Assessment & Plan Note (Signed)
[facility]                             PULMONARY OFFICE NOTE   NAME:PEGRAMEddison, Searls                       MRN:          [id number]  DATE:04/02/2006                            DOB:          1946-03-29    PROBLEMS:  1. Rhinitis.  2. Bronchitis.  3. Esophageal reflux with hiatal hernia.  4. Obstructive sleep apnea.   HISTORY:  He is happier since he gave up on the chin strap and is using  a full-face CPAP mask.  Pressure set at 10 CWPs seems comfortable.  Temazepam did help with CPAP adjustment.  He has continued Symbicort but  has noticed some persistent daily cough.  He had a chest x-ray earlier  this year, and had a CT scan of his chest and sinuses but is a little  unclear where those were taken, and we are trying to track them down.   MEDICATIONS:  Fosamax, potassium, Synthroid, Diovan/HCT, prednisone 5  mg, methotrexate, cyclobenzaprine, multivitamins, CPAP at 10 CWP,  Symbicort 160/4.5.   OBJECTIVE:  VITAL SIGNS:  Weight 186 pounds.  BP 176/102.  Pulse regular  at 82.  Room air saturation 96%.  LUNGS:  There was some inspiratory wheezing without stridor.  HEART:  Heart sounds are regular.  No murmur or gallop heard.  He does  have a little dry cough with forced deep breath but no rhonchi or  dullness.  No visible postnasal drainage or erythema in the pharynx.   IMPRESSION:  This may be a fixed chronic asthma with a chronic  obstructive pulmonary disease component.  I would like to see chest x-  ray and PFTs.  We are going to build that into the followup.  His  obstructive sleep apnea seems adequately controlled but will need  followup over time.   PLAN:  1. We again reviewed CPAP and sleep apnea therapy, to continue with      [employer].  2. Temazepam 15 mg 1 or 2 nightly p.r.n.  We had discussion.  3. We have scheduled pulmonary function tests.  4. Schedule return in four months.  Meanwhile, we will try to get      previous  chest x-ray reports.     Clinton D. Maple Hudson, MD, Tonny Bollman, FACP  Electronically Signed   CDY/MedQ  DD: 04/02/2006  DT: 04/02/2006  Job #: [id number]   cc:   Areatha Keas, M.D.  Petra Kuba, M.D.

## 2010-08-24 NOTE — Discharge Summary (Signed)
NAME:  Johnny Patrick, Johnny Patrick                          ACCOUNT NO.:  [id number]   MEDICAL RECORD NO.:  [id number]                   PATIENT TYPE:  INP   LOCATION:  3036                                 FACILITY:  [facility]   PHYSICIAN:  Payton Doughty, M.D.                   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   OPERATIVE PROCEDURE:  Augmentation of fusion at L3-4 and extension of fusion  to T12.   This is a 65 year old right-handed white gentleman with psoriatic arthritis.  His History and Physical is recounted in the chart.  He has had effusion  from L3 to S1 and has loosening of L3 screw and intractable back pain.   MEDICATIONS:  Calcium, Synthroid, Diovan, Voltaren, Cytotec, prednisone and  Klor-Con.   ALLERGIES:  None.   PAST SURGERIES:  A fused ankle and bilateral hernia repairs.   PHYSICAL EXAMINATION:  GENERAL:  Intact.  NEUROLOGICAL:  Intact with good strength.  He does have intractable back  pain.   He was admitted after ascertaining normal laboratory values and underwent  extensive S-fusion up to T12-L1 with redirection of the L3 screws and  placement of ray cages at L2-3.  Postoperatively he has done well.  This was  a very large operation and he was somewhat slow to mobilize.  He was on the  PCA for 3 days and it was stopped.  Foley was stopped on the third  postoperative day as well.  He has been working in physical therapy.  He can  now get up by himself, put his brace on by himself, and walk about.  His  incision, initially swollen, has moderately reduced swelling now.  He has  remained afebrile.  His incision is dry and clean.  He is being discharged  home in care of his  family on Percocet for pain, Flexeril for spasm, and  Neurontin for neuropathic pain.  His follow up will be in the [facility] in about 10 days for a suture removal.                                                Payton Doughty, M.D.    MWR/MEDQ  D:  11/09/2003  T:  11/09/2003  Job:  [id number]

## 2010-08-24 NOTE — Discharge Summary (Signed)
NAMECANDIDO, Johnny Patrick                ACCOUNT NO.:  [id number]   MEDICAL RECORD NO.:  [id number]          PATIENT TYPE:  IPS   LOCATION:  4008                         FACILITY:  [facility]   PHYSICIAN:  Ranelle Oyster, M.D.DATE OF BIRTH:  02-21-1946   DATE OF ADMISSION:  11/09/2007  DATE OF DISCHARGE:  11/18/2007                               DISCHARGE SUMMARY   DISCHARGE DIAGNOSES:  1. Septic encephalopathy and sepsis syndrome.  2. Hypertension, resolved.  3. Acute renal insufficiency, resolved.  4. Impaired fasting glucose with elevated hemoglobin A1c.  5. Obstructive sleep apnea.  6. Psoriatic arthritis.   HISTORY OF PRESENT ILLNESS:  Johnny Patrick is a 65 year old male with  history of psoriatic arthritis and recent right foot infection requiring  I&D in June 2009 with prolonged antibiotic course and p.o. Augmentin was  discontinued to home; however, readmitted on October 26, 2007, with fall,  hypotension as well as confusion.  The patient was noted to be hypoxic  with leukopenia, thrombocytopenia, and abnormal LFTs with elevated D-  dimer.  CT of head showed no acute changes.  Nuclear medicine perfusion  scan of lung showed low probability of PE.  Dr. Cyndie Chime was  consulted for input on thrombocytopenia and felt this was multifactorial  and agreed with withholding the patient's Arava.  The patient required  vent support for VDRF secondary to septic shock and required treatment  with multiple antibiotics.  ID was consulted for input, and CT of lower  extremities showed several fluid collections in left hip and proximal  left femur.  Hip was aspirated and cultures were negative.  The patient  noted to have elevated cardiac enzymes.  [facility] was  consulted for input and felt this was due to his sepsis syndrome.  GI  has been following for abnormal LFTs and elevated ammonia levels.  Hepatitis panel was negative.  Ultrasound of abdomen showed no  gallstones and normal liver.   Dr. Virginia Rochester felt the patient with hepatic  encephalopathy due to side effect of Augmentin use.  Currently, the  patient's confusion has resolved with decrease in trend in ammonia.  He  was extubated without difficulty, but noted to have significant oral  motor weakness with penetration of all liquids.  Currently, on nectar  liquid supplements only.  Secondary to impairments in mobility and self  care, Rehab consulted for further therapies.   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   Allergies are to AUGMENTIN.   Family history is positive for CHF.   SOCIAL HISTORY:  The patient is married, is retired.  Has been disabled  due to back problems.  The patient has had decreased mobility  since  April 2009 due to AVM of left hip.  Has history of remote tobacco use.  Does not use any alcohol.   FUNCTIONAL HISTORY:  The patient was independent for home ambulation  with cane and walker for the past few months.  He was active and driving  until April 6045.   FUNCTIONAL STATUS:  The patient currently requiring mod-to-max assist  for transfers, able to ambulate 8 feet with a rolling walker at mod-to-  max assist with left lower extremity weakness.  Noted to have poor  endurance, poor standing balance with attempts at ADLs.  Noted to have  issues with continued dysphagia.   PHYSICAL EXAMINATION:  VITAL SIGNS:  At admission reveals blood pressure  113/80, pulse 82, respiratory rate 20, and temperature 98.1.  GENERAL:  The patient is  pleasant, well nourished, and well developed  male, alert and oriented x3.  HEENT:  Sclerae slightly icteric.  Pupils equal, round, and reactive to  light.  Nose and throat exam essentially unremarkable with fair-to-  borderline dentition.  Some thrush noted on tongue.  NECK:  Supple without JVD or lymphadenopathy.  CHEST:  Notable for high-pitched rhonchi, more airway sounds.  No  wheezing.  HEART:  Regular rate rhythm without murmurs or gallops.  ABDOMEN:  Soft, nontender with positive bowel sounds.  SKIN:  He has old ankle scar fusion on left, mild stasis changes in  bilateral lower extremities, well-healed incisions undersurface of right  foot laterally.  Noted to have mild psoriatic lesions bilateral elbows  and right finger at flexural surface.  NEUROLOGIC:  Cranial nerves II through XII grossly intact with good oral  motor control.  Sensation grossly intact except for noted above strength  is 4+/5 to 5/5 in the upper extremity, right lower extremity is 4/5  proximally and 5/5 distally, left lower extremity is 1-1+/5 due to left  hip pain and 3/5 at knee and 2-3/5 at ankles due to fusion.  Judgment,  orientation, memory, and mood are within functional limits.  Memory was  noted to be grossly intact.   HOSPITAL COURSE:  Johnny Patrick was admitted to rehab on November 10, 2007, for inpatient therapies to consist of PT and OT daily.  At the  time of admission, the patient with complaints of hip pain.  He was also  noted to be hypertensive with some renal insufficiency.  Labs done on  November 10, 2007, revealed BUN 24 and creatinine 1.28.  CBC revealed  hemoglobin 11.1, hematocrit 32.9, white count 6.1, and platelets  264,000.  The patient was noted to have some issues with diaphoresis and  was presyncopal secondary to hypertension.  Blood pressure with OT  attempts at a.m. showed BP to drop to 70/40.  The patient was treated  with IV fluids x48 hours with improvement in his blood  pressure.  His BP  meds were additionally held, and thigh-high TEDs as well as abdominal  binder were ordered to help maintain his blood pressure.  PT/OT has been  working with the patient and spacing his therapies to help with his  endurance as well as with close monitoring of blood pressure to improve  his participation in therapy.  During his stay in rehab, the patient  underwent at least 3 hours of therapy 5 days a week during this stay.  Rehab Nursing has been working with the patient regarding skin care,  bowel and bladder training, routine Foley care initially as well as CBG  monitoring.  The patient's hemoglobin A1c was checked and was noted to  be elevated at 7.5.  The patient's blood sugars were checked on a.c. and  nightly basis during this stay.  These have ranged consistently from 110-  150s range.  The patient has been encouraged to follow a carb-modified  diet for now and to follow up with [facility] regarding further input regarding  impaired fasting glucose or hyperglycemia secondary to his chronic  steroids.  The patient's blood pressures have been monitored on b.i.d.  basis.  Initially, diuretics as well as Diovan was held secondary to  hypertension.  As the patient's hydration improved and BP issues  resolved, Lasix was resumed at 20 mg p.o. per day.  Blood pressures at  the time of discharge ranging from 120s-130s systolic and 60s-70s  diastolic.  The patient to continue holding Diovan for now and followup  with [facility] regarding recheck BP prior to reinitiation of Diovan.   The patient's LFTs are noted to be improving.  Repeat check of lytes  shows renal insufficiency to have resolved with sodium 139, potassium  4.3, chloride 109, CO2 26, BUN 19, creatinine 0.93, and glucose 79.  Check of CBC showed the patient with drop in his H&H to 8.8.  One out of  three stool guaiac were positive.  The patient initially was noted to  have some diarrhea; however, this has resolved.  The  patient without any  GI symptoms currently.  Recheck CBC of November 18, 2007, shows  improvement with H&H at 9.5 and 28.0.  Expect the patient's drop in H&H  due to hemodilution due to IV fluids.  The patient is advised to follow  up with Dr. Virginia Rochester for repeat CBC and for further workup of anemia as  needed.  He is discharged on iron supplements to continue on b.i.d.  basis.  The patient has had complaints of back pain.  He was started on  fentanyl as well as p.r.n. Vicodin which has been effective in  controlling his back and hip pain.  He has had complaints of stiffness  of his hands and had multiple requests to resume [place].  Ranae Plumber was  resumed on November 17, 2007.  With increase in patient's mobility, he was  noted to have increase in peripheral edema left lower extremity greater  than right.  Knee-high TEDs were ordered.  The patient also instructed  regarding low-salt diet as well as elevation of lower extremity to help  with edema control.  The patient's mood has been stable.  Wife has been  supportive and has participated in family aid with PT, OT, as well as  speech therapy.  The patient did have repeat swallow study done prior to  discharge.  He was further advanced to D3 diet thin liquids.  He is to  avoid using straws and use chin tuck with meals.  At the time of  admission, the patient was min assist for transfers with ADLs.  OT has  been working with the patient with focus on endurance, sit to stand, as  well as clothing management and toileting transfers.  The patient has  progressed overall to being at supervision level for toilet transfers.  He is able to perform ADLs with assistive equipment as needed at  modified independent level.  He is at supervision for dynamic standing  balance with a rolling walker supervision for toileting, supervision for  simple home management tasks.  At the time of admission, the patient was  at  min assist with full left lower extremity for  mobility secondary to  hip pain.  The patient was able to perform bed to chair transfers,  squat, pivot, min-to-mod assist with setup and sequencing with safety  for left lower extremity.  He was able to ambulate 25 feet at min-to-mod  assist with increase in left lower extremity pain, mod assist for  navigating three stairs.  At the time of discharge, the patient had  progressed to being at modified independent for bed to chair transfers,  supervision for car transfers, supervision for ambulating 150 feet with  a rolling walker, able to navigate wheelchair at modified independent,  able to navigate three stairs with a rolling walker with min assist.  Wife has undergone education with PT regarding all mobility as well as  transfers.  Speech has been working with the patient for dysphasia  treatment and also to help monitor the patient's isometric isotonic  therapeutic exercises to titrate his pharyngeal and laryngeal  musculature.  At the time of discharge, the patient was at supervision  for the exercises.  He was able to tolerate a regular diet without signs  of aspiration.  The patient will continue with further followup home  health, PT, OT, as well as RN by [facility] past discharge on  November 18, 2007.  The patient is discharged to home.   DISCHARGE MEDICATIONS:  1. Coreg 25 mg p.o. b.i.d.  2. Synthroid 150 mcg a day.  3. Neurontin 300 mg b.i.d.  4. Protonix 40 mg.  5. Norco 5/325 one p.o. q.8-12 h. p.r.n. pain #90 RX.  6. Prednisone 5 mg two p.o. per day.  7. Duragesic patch 12 mcg an hour change q.72 h. two boxes RX.  8. Restoril 15 mg p.o. nightly.  9. Vitamin D 500 mg a day.  10.Flexeril 10 mg half to one p.o. q.8 h. p.r.n.  11.Ferrous sulfate 325 mg b.i.d.  12.Lasix 40 mg half p.o. per day.  13.K-Dur 12 mEq a day.  14.Symbicort 1 puff b.i.d.  15.Arava 20 mg once a day.  16.Calcium supplements t.i.d.  17.Multivitamin once a day.  18.Vitamin C daily.   Diet is  regular diet, watch straws and chin tuck with swallows, no  straws.   ACTIVITIES:  Supervision 24-hour.  No strenuous activity.  No alcohol.  No smoking.  No driving.   SPECIAL INSTRUCTIONS:  Do not use Diovan.  [facility] to provide  PT, OT, and RN for followup.   FOLLOWUP:  The patient to follow up with Dr. Riley Kill on December 18, 2007, for pain management and to follow up with Dr. Jimmy Footman for  routine check.  Follow up with Dr. Leonides Grills for postop check in 2  weeks.  Follow up with Dr. Virginia Rochester in 2 weeks for recheck liver functions as  well as CBC.      Greg Cutter, P.A.      Ranelle Oyster, M.D.  Electronically Signed    PP/MEDQ  D:  11/19/2007  T:  11/20/2007  Job:  [id number]   cc:   Areatha Keas, M.D.  Leonides Grills, M.D.  Dr. Virginia Rochester

## 2010-08-24 NOTE — Assessment & Plan Note (Signed)
[facility]                               PULMONARY OFFICE NOTE   NAME:PEGRAMKeiland, Pickering                       MRN:          [id number]  DATE:11/19/2005                            DOB:          05-Sep-1945    This 65 year old man came today saying he was self-referred because of a  concern about a cough.  We have referral notes now available from Dr. Flo Shanks indicating referral also because of cough.   HISTORY:  He quit smoking 15 years ago.  He said that he developed a cough  with a cold syndrome in November 2006.  The cough persisted quite awhile and  was resistant to work-up including several rounds of Z-Pak but he says  improved dramatically after he was given a round of Keflex to treat a  problem with his left parotid gland.  He is interested in trying another  round.  ENT work-up including CT scan of sinuses reportedly was not  diagnostic and he had a swallowing study done.  He had GI evaluation by Dr.  Ewing Schlein in June of this year managing reflux symptoms.  He says the cough is  now mostly gone.  The CT scan done a year ago by Dr. Phylliss Bob Mr. Worlds  describes as showing a scar.  Currently Mr. Guzzetta says that he can snort  and draw down some post nasal drip. Sputum is usually gray.   REVIEW OF SYSTEMS:  He denies shortness of breath. Feels that he stays  somewhat hoarse and is aware that he snores some.  He denies waking at night  choking or strangling and is little aware of any reflux at all.  Occasional  nocturia.  No chest pain or palpitations.  Nothing bloody or purulent.  No  adenopathy or fever.   PAST MEDICAL HISTORY:  1. Hypertension.  2. Kidney stone.  3. Back surgery.  4. Psoriatic arthritis.  5. No prior history of lung or respiratory illness including asthma or      pneumonia.   MEDICATIONS:  1. Fosamax.  2. Klor-Con.  3. Norvasc 10 mg.  4. Synthroid 15 mcg.  5. Diovan HCT 160/12.5 b.i.d.  6. Prednisone 5 mg.  7.  Methotrexate analog.  8. Cyclobenzaprine 10 mg q.8 h. p.r.n.  9. Nexium 40 mg b.i.d.  10.Vesicare.  11.Endocet.   ALLERGIES:  No medication allergy.   SOCIAL HISTORY:  Quit smoking 15 years ago.  Married.  He had farmed a very  long time ago and then he worked [employer] and for the last 30  years he worked at [employer] where there was some exposure to cigarette  filters and associated dust.  He denies active exposure to asbestos.   FAMILY HISTORY:  Positive for congestive heart failure.  Mother had breast  cancer.   OBJECTIVE:  VITAL SIGNS:  Weight 139 pounds, BP 164/92, pulse regular 86  with room air saturation 98%.  GENERAL:  This is a somewhat overweight, comfortable-appearing gentleman.  SKIN:  Very fair complexioned.  No definite rash.  ADENOPATHY:  None found.  HEENT:  Conjunctivae clear.  Nasal mucosa did not appear particularly  congested but he did snort a couple of times.  I saw no post nasal drainage.  There was no stridor and no neck vein distention.  CHEST:  Quiet, clear lungs.  No cough even with forced deep in and out  breaths.  HEART:  Regular without murmur or gallop.  EXTREMITIES:  Without cyanosis, clubbing or edema.   IMPRESSION:  1. Rhinitis.  2. Bronchitis.   Note the patient says he is almost well.  He is just watching to see what  the current weather season will do.  He has significant confidence in the  Keflex course he took earlier.  We discussed options emphasizing the goal of  avoiding unnecessary therapies as long as he really is satisfied that he is  clearing up finally on its own.   PLAN:  1. He was given a standby prescription for Keflex 500 mg q.i.d. to take if      definite flareup occurs again.  This is actually for when he is unable      to get in immediately to the office.  2. If he is not doing better in a month or so, he is to stop what he is      doing and come over so we can get on the same page.                                    Clinton D. Maple Hudson, MD, FCCP, FACP   CDY/MedQ  DD:  11/20/2005  DT:  11/20/2005  Job #:  [id number]   cc:   Gloris Manchester. Lazarus Salines, MD  Areatha Keas, MD

## 2010-08-24 NOTE — Discharge Summary (Signed)
[name], [name]                ACCOUNT NO.:  [id number]   MEDICAL RECORD NO.:  [id number]          PATIENT TYPE:  IPS   LOCATION:  4008                         FACILITY:  [facility]   PHYSICIAN:  Devra Dopp, MSN, ACNP DATE OF BIRTH:  1945-11-17   DATE OF ADMISSION:  11/09/2007  DATE OF DISCHARGE:  11/18/2007                               DISCHARGE SUMMARY   ADDENDUM:  Mr. Johnny Patrick was transferred to rehabilitation  services on November 09, 2007 without any changes in his medical  pharmaceutical regimen or care.  No further changes were needed.      Devra Dopp, MSN, ACNP     SM/MEDQ  D:  11/25/2007  T:  11/25/2007  Job:  [id number]

## 2010-08-24 NOTE — Assessment & Plan Note (Signed)
 HEALTH[facility]ARE                               PULMONARY OFFICE NOTE   NAME:PEGRAMEffie, Johnny                       MRN:          [id number]  DATE:01/13/2006                            DOB:          [birth year]    PROBLEM:  1. Rhinitis.  2. Bronchitis.   HISTORY:  When first seen in August patient had largely recovered from an  earlier bronchitic exacerbation.  He comes now saying this is like last  year.  He began coughing again about 2 weeks ago with no cold exposures or  recognized triggers.  Scant yellow sputum.  He remains on prednisone 10 mg  daily for psoriatic arthritis.  He has confidence in the benefit of Keflex  when he has an infection.  He does not recognize reflux.  We talked about  that specifically, but he continues taking Nexium.  His wife complains of  his loud snore witnessed apneas and asked him to speak to me about getting a  sleep study done.  He may notice some nasal congestion lying down, but it is  not traumatic.  He does admit to some daytime tiredness.   MEDICATION:  1. Fosamax.  2. Klor-Con.  3. Norvasc 10 mg.  4. Synthroid 15.  5. Diovan HCT 160/12.5 taken b.i.d.  6. Prednisone 5 mg.  7. Methotrexate.  8. __________  20 mg.  9. Cyclobenzaprine 10 mg nightly.  10.Calcium.  11.Multivitamins.  12.Vitamin D.  13.Nexium 40 mg b.i.d.  14.Vesicare.  15.Endocet 10/650.   No medication allergy.   OBJECTIVE:  Weight 184 pounds.  Blood pressure 138/84.  Pulse regular 89.  Room air saturation 97%.  There is mild nasal congestion and minimal chest congestion without wheeze,  rhonchi or cough.  I do not see significant mucus or postnasal drainage.  His pharynx is red without exudates or strider.  He is not hoarse.  There is  no adenopathy.  Mild abdominal obesity.   IMPRESSION:  1. Nonspecific exacerbation of bronchitis and rhinitis.  At this time of      year it could be viral or an allergic episode.  Still wonder about     recurrent episodes of reflux despite his twice a day Nexium.  2. Probable obstructive sleep apnea.  3. Question gastroesophageal reflux disease.   PLAN:  1. Reflux precautions.  2. Mucinex.  3. Keflex 500 mg q.i.d. for 7 days.  4. We are scheduling a nocturnal polysomnogram with split protocol at the      [facility].  5. Nebulizer treatment today Xopenex 0.125 mg.  6. He will return after his sleep study.       Clinton D. Maple Hudson, MD, FCCP, FACP      CDY/MedQ  DD:  01/14/2006  DT:  01/15/2006  Job #:  [id number]   cc:   Gloris Manchester. Lazarus Salines, M.D.  Areatha Keas, M.D.  [facility]

## 2010-08-24 NOTE — Op Note (Signed)
Johnny Patrick, Johnny Patrick                ACCOUNT NO.:  [id number]   MEDICAL RECORD NO.:  [id number]          PATIENT TYPE:  AMB   LOCATION:  DSC                          FACILITY:  [facility]   PHYSICIAN:  Katy Fitch. Sypher, M.D. DATE OF BIRTH:  1945/10/20   DATE OF PROCEDURE:  05/30/2006  DATE OF DISCHARGE:                               OPERATIVE REPORT   PREOPERATIVE DIAGNOSES:  1. 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  2. Enlarging mass, medial left brachium proximal to medial condyle,      rule out enlarging lymph node.   POSTOPERATIVE DIAGNOSES:  1. 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  2. Enlarging mass, medial left brachium proximal to medial condyle,      rule out enlarging lymph node.  3. Identification of an inflammatory lymphangitis and subdermal      adipose mass, consistent with a possible atypical infection.   OPERATION:  Excisional biopsy of mass, left medial brachium, without  identification of significant lymph node pathology; however, there was  extensive saponification of the subdermal fat with evidence of lymphatic  inflammation and enlargement of lymph channels, consistent with a  possible sporotrichosis or other atypical infection.  Cultures for AFB  and fungus were sent as well as routine histopathology and a portion of  the mass in saline for a possible touch prep-type pathologic evaluation.   OPERATIONS:  Katy Fitch. Sypher, M.D.   ASSISTANT:  Marveen Reeks Dasnoit, P.A.-C.   ANESTHESIA:  General by LARYNGEAL MASK AIRWAY.   SUPERVISING ANESTHESIOLOGIST:  Germaine Pomfret, M.D.   INDICATIONS:  Johnny Patrick is 65 year old gentleman with history of  chronic psoriatic arthritis.  He is referred by Dr. Trey Sailors for  evaluation of a painful right wrist, and during our consultation he  noted an enlarging mass in the medial aspect of his left brachium.   Plain x-rays of his wrist documented an advanced arthritis.  We advised  him that we  would treat his wrist with a steroid injection.  At the  present time, he is not a candidate for surgical intervention.   Of more interest was an enlarging mass in the medial aspect of his left  brachium proximal to the medial epicondyle.  The mass was in the  position of the medial epitrochlear lymph node.  He had no history of  exposure to cats, no history of recent infection or dermatitis in the  arm.  Given the fact that he is immune suppressed due to his treatment  for psoriatic arthritis, we recommended proceeding with an excisional  biopsy at this time.   My concern is that this could have been a pathologic lymph node.   After informed consent, he is brought to the operating at this time.   PROCEDURE:  Johnny Patrick was brought to the operating room and placed in  the supine position on the operating table.   Following an anesthesia consult by Dr. Krista Blue and Dr. Jean Rosenthal, general  anesthesia by LMA was recommended.   He was brought to the operating room and placed in the supine position  on  the operating table, and after induction of anesthesia, he was noted  to have and infiltrated right forearm IV.  The IV was changed to an  antecubital position, and the procedure commenced with induction of  general anesthesia.   Once anesthesia was stably obtained, the right wrist was injected from a  dorsal approach through the 3-4 dorsal arthroscopic portal with a  mixture of 1 mL of Depo-Medrol, 40 mg per mL, and 1 mL of 1% plain  lidocaine.   The wrist was instrumented without difficulty.  A Band-Aid was used as a  dressing.   The left arm was then prepped with Betadine soap and solution and  sterilely draped.  A pneumatic tourniquet was applied to the proximal  left brachium.  After exsanguination of the left arm with an Esmarch  bandage, the arterial tourniquet was inflated to 250 mmHg.   The mass had enlarged dramatically since his office visit several weeks  prior.  At the  time of the office visit this measured 1.5 cm in  diameter.  It was now measuring more than 3 cm in diameter.   A 4-cm incision was fashioned directly over the mass.  The subcutaneous  tissues were carefully divided, revealing an inflammatory area of  saponified fat.   The lymph channels leading to and from the mass were dilated and  slightly inflammatory.   The lymph node was not directly involved.   The mass was circumferentially dissected and bisected.  Half of the mass  was sent in formalin for pathologic evaluation, and another half of the  mass was sent in saline to allow culture and touch prep.   A smaller portion was placed in a sterile tube and sent for acid-fast  growth and fungal growth cultures.   The areas of inflammatory fat were thoroughly debrided with sharp  dissection and use of rongeur to clean the deep surface of the dermis.   From the appearance of our gross identification, past experience would  suggest that this could be an atypical mycobacterial infection or  something like sporotrichosis.   We will cover Johnny Patrick with prophylactic antibiotics pending a  pathologic evaluation.   The wound was inspected for bleeding points which were electrocauterized  with bipolar current followed by repair of the skin with intradermal 3-0  Prolene and Steri-Strips.  A Tegaderm dressing was applied with Ace  wrap.   The tourniquet was released with immediate capillary refill of the  fingers and thumb.  Johnny Patrick was awakened from general anesthesia and  transferred to the recovery room with stable vital signs.   For aftercare he is provided a prescription for Percocet 5 mg one p.o.  q.4-6h. p.r.n. pain.  He is also placed on doxycycline 100 mg one p.o.  q.12h. x10 days.      Katy Fitch Sypher, M.D.  Electronically Signed     RVS/MEDQ  D:  05/30/2006  T:  05/30/2006  Job:  [id number]  cc:   Payton Doughty, M.D.

## 2010-08-24 NOTE — Op Note (Signed)
NAME:  Johnny Patrick, Johnny Patrick                          ACCOUNT NO.:  [id number]   MEDICAL RECORD NO.:  [id number]                   PATIENT TYPE:  INP   LOCATION:  2899                                 FACILITY:  [facility]   PHYSICIAN:  Payton Doughty, M.D.                   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   PROCEDURE:  L2-3 laminectomy, diskectomy, posterior lumbar interbody fusion  with Ray threaded fusion cages.  T12-L3 segmental posterior fixation,  revision of fixation from L3-S1.  Posterolateral arthrodesis from T12-L3.   SURGEON:  Payton Doughty, M.D.   ANESTHESIA:  General endotracheal.  Prepped with sterile Betadine scrub and  scrubbed with alcohol wipe.   COMPLICATIONS:  None.   ASSISTANT:  Nurse assistant is Silverthorne, doctor assistant is Stefani Dama, M.D.   INDICATIONS FOR PROCEDURE:  This is a 66 year old gentleman with severe  lumbar spondylosis and decreasing ability to walk.   DESCRIPTION OF PROCEDURE:  He is taken to the operating room, smoothly  anesthetized, and intubated.  Placed prone on the operating table.  Following shave, prepped and draped in the usual sterile fashion.  The skin  was then incised from the top of T12 to the bottom of S1.  The lamina and  transverse processes of T12, L1, L2, and L3 were exposed bilaterally in the  subperiosteal plane.  The screw construct from L3 to S1 was exposed  bilaterally.  It was found that the L3 screws had cut out posteriorly in the  pedicles and they were removed.  The rods connecting them to L4, L5, and S1  were also removed.  The pars interarticularis lamina and inferior facet of  L2 and the superior facet of L3 were removed bilaterally.  This allowed  decompression of the L2 and L3  nerve roots as they traversed this area.  14  x 21 mm Ray threaded fusion cages were then placed at 2-3 and intraoperative  x-ray showed good placement of cages.  Pedicle screws were then placed on  the left side at T12, L1, L2 and then the remaining pedicle of L3.  On the  right side, T12, L1, and L2 could be placed, the pedicle of L3 on the right  side was inadequate for pedicle fixation and therefore it was bridged.  Once  the pedicle screws were placed, 200 mm rods were selected and contoured  appropriately to fit from T1 to S1.  There was offset device used at L3 on  the left.  Intraoperative x-ray showed excellent alignment, good placement  of pedicle screws.  The rod was locked down to the screws with the locking  caps.  Transverse traction devices were applied.  Facet joints of T12-L1, L1-  2 were drilled out and bone graft pressed into them as well as the lamina.  The transverse processes of L1, L2, and L3 were decorticated and bone graft  mixed with DBX was placed in intertransverse position as posterolateral  arthrodesis.  Prior to this, the entire area was irrigated and hemostasis  assured.  The fascia was reapproximated with 0 Vicryl in interrupted  fashion.  Subcutaneous tissue was reapproximated with 0 Vicryl in  interrupted fashion.  Subcuticular tissue was  reapproximated with 3-0 Vicryl in interrupted fashion.  The skin was closed  with 3-0 nylon in a running locking fashion.  Betadine Telfa dressing was  applied and made occlusive with Op-Site.  The patient returned to the  recovery room in good condition.                                               Payton Doughty, M.D.    MWR/MEDQ  D:  11/01/2003  T:  11/01/2003  Job:  [id number]

## 2010-08-24 NOTE — H&P (Signed)
NAME:  Johnny Patrick, Johnny Patrick                          ACCOUNT NO.:  [id number]   MEDICAL RECORD NO.:  [id number]                   PATIENT TYPE:  INP   LOCATION:  2899                                 FACILITY:  [facility]   PHYSICIAN:  Payton Doughty, M.D.                   DATE OF BIRTH:  11-15-1945   DATE OF ADMISSION:  11/01/2003  DATE OF DISCHARGE:                                HISTORY & PHYSICAL   ADMISSION DIAGNOSES:  1. Spondylosis L1-2 and L2-3.  2. Loosening of hardware at L3-4.   HISTORY OF PRESENT ILLNESS:  This is a very nice now 65 year old right-  handed white gentleman who in May 2003, was fused at L3-4,  L4-5, and L5-S1.  He did relatively well, and then over the past few months  began developing increasing right leg pain.  For the past couple of months  he has had increasing pain in his right leg and his low back.  Films have  shown loosening of the screws at L2-3, although there is no fracture, and  there is bone graft growing there.  He also has progressive small loosening  at L2-3 with stenosis, L1-2 spondylosis.  He is admitted now for extension  of his fusion from L3 up to T12.   PAST MEDICAL HISTORY:  Psoriatic arthritis.   MEDICATIONS:  1. Calcium 1800 mg daily.  2. Arava 20 mg daily.  3. Synthroid 0.015 mg daily.  4. Diovan 80/12.5 mg daily.  5. Voltaren 75 mg daily.  6. Cytotec 200 mcg b.i.d.  7. Prednisone 5 mg daily.  8. Chlorocon daily.   ALLERGIES:  No known drug allergies.   PAST SURGICAL HISTORY:  1. Bilateral hernia repairs.  2. Fused ankle.   SOCIAL HISTORY:  He does not smoke, drinks on a social basis.  Retired from  [employer].   FAMILY HISTORY:  Was not given.   REVIEW OF SYSTEMS:  Remarkable for hypertension, arthritis.   PHYSICAL EXAMINATION:  HEENT:  Within normal limits.  NECK:  He has good range of motion of his neck.  CHEST:  Clear.  CARDIAC:  Regular rate and rhythm.  ABDOMEN:  Nontender, no hepatosplenomegaly.  EXTREMITIES:   Without clubbing or cyanosis.  Peripheral pulses are good.  GENITOURINARY:  Deferred.  NEUROLOGIC:  He is awake, alert, and oriented.  Cranial nerves are intact.  Motor examination shows 5/5 strength throughout the upper and lower  extremities except for dorsiflexion of the left ankle which is limited  because of his fusion.  Pushing and pulling his back and motion of his back  causes extreme discomfort right at the top of his incision.  Reflexes are 1  at the knees, absent at the ankles.  Straight leg raise is positive on the  right side.   LABORATORY DATA:  Radiographic results have been reviewed above.   IMPRESSION:  Progressive lumbar spondylosis at  L2-3 and L1-2.   PLAN:  Expose his entire old fusion, re-direct the screws at L3, place Ray  cages at L2-3, place pedicle screws at L2, L1, and T12, and incorporate a  rod construct from T12 through S1.  The risks and benefits of this approach  have been discussed with him, he wishes to proceed.                                                Payton Doughty, M.D.    MWR/MEDQ  D:  11/01/2003  T:  11/01/2003  Job:  [id number]

## 2010-08-24 NOTE — Assessment & Plan Note (Signed)
[facility]                               PULMONARY OFFICE NOTE   NAME:PEGRAMConner, Muegge                       MRN:          [id number]  DATE:02/03/2006                            DOB:          [birth year]    PROBLEMS:  1. Rhinitis.  2. Bronchitis.  3. Esophageal reflux with hiatal hernia.  4. Obstructive sleep apnea.   HISTORY:  Continues clear nasal drainage and chronic cough.  Antireflux  therapy did not seem to affect the cough, nor did a trial of Keflex or  nebulizer treatment given here last visit.  He does not really notice  wheezing but chokes occasionally while eating and we discussed aspiration  reflux precautions and careful swallowing.  He has worked with Dr. Ewing Schlein and  is directed to see him again.  Primary issue today is follow up of the sleep  study done January 18, 2006, which showed moderately-severe obstructive  apnea with an index of 51.1 per hour nonpositional events, moderate snoring,  and oxygen desaturation quite low, to 52%.  CPAP was successfully titrated  to 11 CWP for an index of 3.6 per hour.  Oxygen saturation stayed low, even  with CPAP, and we discussed implications.   MEDICATION:  1. Fosamax.  2. Klor-Con.  3. Norvasc 10 mg.  4. Synthroid 15 mcg.  5. Diovan HCT 160/12.5 b.i.d.  6. Prednisone 5 mg.  7. Methotrexate.  8. Cyclobenzaprine.  9. Calcium, multivitamins.  10.Mucinex.  11.Tussionex.   No medication allergy.   OBJECTIVE:  VITAL SIGNS:  Weight 186 pounds, BP 156/86, pulse regular 92,  room air saturation 98%, noting this is substantially better than the values  recorded at the sleep center.  GENERAL:  He is talkative and seems comfortable.  There is a loose cough  with no real rhonchi, wheeze, or dullness.  HEART SOUNDS:  Regular with no murmur.  There is no edema or cyanosis.   IMPRESSION:  1. Asthmatic bronchitis with chronic obstructive pulmonary disease.  We      are going to want to follow up  on his nocturnal oxygen levels.  2. Moderately-severe obstructive sleep apnea, appropriate for a home trial      of CPAP.  3. Laryngopharyngeal reflux and esophageal reflux disease.   PLAN:  1. Home CPAP trial at 11 CWP.  2. Weight loss and driving safety were emphasized.  3. Reflux precautions.  4. Temazepam 15 mg q.h.s. p.r.n. for the initial week or two as he adjusts      to CPAP.  5. Try Symbicort 160/4.5 two puffs b.i.d., rinsing mouth.  6. Return 1 month, question need for overnight oxygen, PFT, or chest x-ray      because of his hypoxia during the sleep study.  7. He is directed to return to Dr. Ewing Schlein for followup, and he will      continue with his primary care physicians as well.     Clinton D. Maple Hudson, MD, Tonny Bollman, FACP  Electronically Signed    CDY/MedQ  DD: 02/04/2006  DT: 02/05/2006  Job #: [id number]   cc:  Gloris Manchester. Lazarus Salines, M.D.  Areatha Keas, M.D.  Petra Kuba, M.D.

## 2010-08-24 NOTE — Discharge Summary (Signed)
Johnny Patrick. [facility]  Patient:    Johnny Patrick, Johnny Patrick Visit Number: [id number] MRN: [id number]          Service Type: SUR Location: [id number] Attending Physician:  Emeterio Reeve Dictated by:   Payton Doughty, M.D. Admit Date:  08/25/2001 Discharge Date: 09/01/2001                             Discharge Summary  ADMISSION DIAGNOSIS:  Lumbar spondylosis, L3-4, L4-5 and L5-S1.  DISCHARGE DIAGNOSIS:  Lumbar spondylosis, L3-4, L4-5, and L5-S1.  OPERATION:  L3-4, L4-5 and L5-S1 laminectomy and diskectomy.  Posterior lumbar interbody fusion with segmental posterior lateral arthrodesis.  COMPLICATIONS:  None.  CONDITION ON DISCHARGE:  Well  HISTORY OF PRESENT ILLNESS:  A 65 year old, right handed white gentleman whose history and physical is recounted in the chart.  He has had progressive increasing back and leg pain.  He has failed conservative therapies and is admitted for fusion.  PAST MEDICAL HISTORY:  Remarkable for psoriatic arthritis.  MEDICATIONS:  Calcium, Arava, Synthroid, Diovan, Voltaren, Cytotec, Prednisone and Klor-Con.  ALLERGIES:  None.  PHYSICAL EXAMINATION:  General exam is unremarkable.  Neurologic exam with intact strength, with negative straight leg raise.  CLINICAL IMPRESSION:  Severe spondylosis with incapacitating back pain and neurogenic claudication.  HOSPITAL COURSE:  He was admitted after ascertainment of normal laboratory values and underwent a 3-4, 4-5, 5-1 fusion.  Postoperatively, he has done relatively well.  Because of the chronic Prednisone, he was placed on a Decadron taper which lasted over five days. Once that was over he had no signs of Adsons problems.  His strength has remained full.  His incision is dry. He was followed carefully by physical therapy and has been up and about wearing his brace.  He is now eating and voiding normally with stable vital signs.  He is being discharged home in the care of his  family with Percocet for pain back on 5 mg of Prednisone a day.  He is to follow up with me in the [facility] office in a week for suture removal. Dictated by:   Payton Doughty, M.D. Attending Physician:  Emeterio Reeve DD:  09/01/01 TD:  09/02/01 Job: [id number]

## 2010-08-24 NOTE — Op Note (Signed)
[facility]  Patient:    Johnny Patrick, Johnny Patrick Visit Number: [id number] MRN: [id number]          Service Type: END Location: ENDO Attending Physician:  Sabino Gasser Dictated by:   Sabino Gasser, M.D. Admit Date:  01/19/2001                             Operative Report  PROCEDURE:  Colonoscopy.  ENDOSCOPIST:  Sabino Gasser, M.D.  INDICATIONS:  Rectal bleeding, colon cancer screening.  ANESTHESIA:  Demerol 70, Versed 5 mg.  DESCRIPTION OF PROCEDURE:  With the patient mildly sedated in the left lateral decubitus position, the Olympus videoscopic colonoscope was inserted in the rectum and passed under direction vision into the cecum, identified by ileocecal valve and appendiceal orifice.  After clearing the cecum of fecal debris, the colonoscope was slowly withdrawn, taking circumferential views of the entire colonic mucosa, stopping at 25 cm from the anal verge, at which point a polyp was seen, and it was felt to be a polyp on top of a broad base. I could not be clear that it was not an inverted diverticulum.  I elected only to eradicate it out of the top of the tissue.  First attempt with snare to snare the polyp, but subsequently reverted to hot biopsy forceps and just cauterized the mucosa.  Once accomplished, the endoscope was withdrawn, taking circumferential views of the remaining colonic mucosa, stopping in the rectum which appeared normal on direct view and showed hemorrhoidal tissue on retroflexed view.  The endoscope was straightened and withdrawn through the anal canal which appeared unremarkable, except for external hemorrhoidal tag tissue.  The patients vital signs and pulse oximetry remained stable.  The patient tolerated procedure well without apparent complications.  FINDINGS:  Polyp at 25 cm eradicated.  Internal and external hemorrhoids.  PLAN:  Will have the patient give me a call to follow up in about three years or as needed. Dictated by:    Sabino Gasser, M.D. Attending Physician:  Sabino Gasser DD:  01/19/01 TD:  01/19/01 Job: [id number]

## 2010-08-24 NOTE — H&P (Signed)
[place]. [facility]  Patient:    Johnny Patrick, Johnny Patrick Visit Number: [id number] MRN: [id number]          Service Type: Attending:  Payton Doughty, M.D. Dictated by:   Payton Doughty, M.D. Adm. Date:  08/25/01                           History and Physical  ADMITTING DIAGNOSIS:  Severe lumbar spondylosis at 3-4, 4-5 and 5-1.  SERVICE:  Neurosurgery.  HISTORY OF PRESENT ILLNESS:  A now 65 year old right-handed white gentleman who I saw with back pain nearly a year ago.  He had been having it off and on since last fall.  He got an MRI that showed a foraminal disk at 3-4 on the right side.  He underwent epidural steroids and it helped transiently but then increased pain in his back and down his left leg.  He was improving when I first saw him and I have been following him conservatively.  He had been working and I saw him in November doing well, then in April he had a marked increase in back pain and pain in his right leg.  It went down the lateral aspect of his leg and did go into his shin.  He was restudied with an MRI that showed extensive degenerative change at 3-4 with worsening stenosis, central disk at 4-5 with stenosis and 5-1 biforaminal narrowing.  He is therefore admitted for a laminectomy and diskectomy and posterior lumbar interbody fusion at these three levels.  MEDICAL HISTORY:  Medical history is remarkable for psoriatic arthritis.  MEDICATIONS: 1. Calcium 1800 mg a day. 2. Arava 20 mg a day. 3. Synthroid 0.015 mg a day. 4. Diovan 80/12.5 once a day. 5. Voltaren 75 mg b.i.d. 6. Cytotec 200 mcg b.i.d. 7. Prednisone 5 mg once a day. 8. Klor-Con daily.  ALLERGIES:  He has no allergies.  PAST SURGICAL HISTORY:  Surgical history is remarkable for bilateral hernia repairs and he has a fused ankle.  SOCIAL HISTORY:  He does not smoke, drinks on a very limited social basis and works for [employer].  FAMILY HISTORY:  Family history was not  given.  REVIEW OF SYSTEMS:  Review of systems is remarkable for hypertension and arthritis.  PHYSICAL EXAMINATION:  HEENT:  Within normal limits.  NECK:  He has reasonable range of motion of his neck.  CHEST:  Clear.  CARDIAC:  Regular rate and rhythm.  ABDOMEN:  Nontender with no hepatosplenomegaly.  EXTREMITIES:  Without clubbing or cyanosis.  GU:  Deferred.  PERIPHERAL PULSES:  Good.  NEUROLOGIC:  He is awake, alert and oriented.  His cranial nerves are intact. Motor exam shows 5/5 strength throughout the upper and lower extremities, save for dorsiflexion of the left ankle, which is limited because of fusion. Pushing and pulling does not cause him discomfort.  Reflexes are 1 at the knees and absent at the ankles.  Straight leg raise is negative.  LABORATORY AND ACCESSORY DATA:  His radiographic results have been reviewed above.  CLINICAL IMPRESSION:  Severe lumbar spondylosis with incapacitating back pain and leg pain.  PLAN:  The plan is for a lumbar laminectomy, diskectomy and posterior lumbar interbody fusion with Ray threaded fusion cages at L3-4, L4-5 and L5-S1. Because of his arthritis and steroid use, he will also have to this augmented with pedicle screws.  The risks and benefits of this approach have been discussed with him and  he wishes to proceed. Dictated by:   Payton Doughty, M.D. Attending:  Payton Doughty, M.D. DD:  08/25/01 TD:  08/25/01 Job: [id number]

## 2010-08-24 NOTE — Procedures (Signed)
Johnny Patrick, Johnny Patrick                ACCOUNT NO.:  [id number]   MEDICAL RECORD NO.:  [id number]          PATIENT TYPE:  OUT   LOCATION:  SLEEP CENTER                 FACILITY:  [facility]   PHYSICIAN:  Clinton D. Maple Hudson, MD, FCCP, FACPDATE OF BIRTH:  1945/04/28   DATE OF STUDY:                              NOCTURNAL POLYSOMNOGRAM   REFERRING PHYSICIAN:  Dr. Jetty Duhamel   INDICATION FOR STUDY:  Hypersomnia with sleep apnea.   EPWORTH SLEEPINESS SCORE:  4/24, BMI 28, weight 182 pounds.   HOME MEDICATIONS:  Were charted and reviewed.   SLEEP ARCHITECTURE:  Total sleep time 349 minutes with sleep efficiency 83%.  Stage 1 was 5%, stage 2 79%, stages 3 and 4 absent, REM 16% of total sleep  time.  Sleep latency 49 minutes, REM latency 195 minutes, awake after sleep  onset 22 minutes, arousal index 21.6.  No bedtime medication was reported.   RESPIRATORY DATA:  Split study protocol.  Apnea/hypopnea index (AHI, RDI)  51.1 obstructive events per hour indicating moderately severe obstructive  sleep apnea/hypopnea syndrome before CPAP.  This included 101 obstructive  apneas and 17 hypopneas before CPAP.  Events were not positional.  REM AHI  3.3 per hour.  CPAP was titrated to 11 CWP, AHI 3.6 per hour.  A medium  Mirage Quattro full face mask was used with heated humidifier.   OXYGEN DATA:  Moderately loud snoring with oxygen desaturation to a nadir of  52%.  After CPAP control, saturation held 89-90% on room air.  Question  underlying cardiopulmonary disease because of the persistent low saturation.  Supplemental oxygen was not used.   CARDIAC DATA:  Normal sinus rhythm.   MOVEMENT/PARASOMNIA:  Occasional limb jerk with arousal, not significant.   IMPRESSION/RECOMMENDATION:  1. Moderately severe obstructive sleep apnea/hypopnea syndrome,      apnea/hypopnea index 51.1 per hour with nonpositional events,      moderately loud snoring, and oxygen desaturation to a nadir of 52%.  2. Successful  CPAP titration to 11 CWP, apnea/hypopnea index 3.6 per hour.      A medium Mirage Quattro mask was used with heated humidifier.  3. Note that even with CPAP sustained oximetry saturation mean was around      89% on room air.  Question underlying cardiopulmonary disease if      verified.      Clinton D. Maple Hudson, MD, [facility], FACP  Diplomate, Biomedical engineer of Sleep Medicine  Electronically Signed     CDY/MEDQ  D:  01/19/2006 13:19:07  T:  01/20/2006 14:16:47  Job:  [id number]

## 2010-08-28 ENCOUNTER — Other Ambulatory Visit: Payer: Self-pay | Admitting: Oncology

## 2010-08-28 ENCOUNTER — Encounter: Payer: Medicare Other | Admitting: Oncology

## 2010-08-28 LAB — CBC WITH DIFFERENTIAL/PLATELET
Basophils Absolute: 0 10*3/uL (ref 0.0–0.1)
Eosinophils Absolute: 0.2 10*3/uL (ref 0.0–0.5)
HGB: 11.3 g/dL — ABNORMAL LOW (ref 13.0–17.1)
NEUT#: 5.1 10*3/uL (ref 1.5–6.5)
RBC: 3.77 10*6/uL — ABNORMAL LOW (ref 4.20–5.82)
RDW: 16.8 % — ABNORMAL HIGH (ref 11.0–14.6)
WBC: 7.5 10*3/uL (ref 4.0–10.3)
lymph#: 1.7 10*3/uL (ref 0.9–3.3)

## 2010-08-28 LAB — MORPHOLOGY: PLT EST: ADEQUATE

## 2010-08-29 LAB — FERRITIN: Ferritin: 183 ng/mL (ref 22–322)

## 2010-08-29 LAB — VITAMIN B12: Vitamin B-12: 295 pg/mL (ref 211–911)

## 2010-08-29 LAB — ERYTHROPOIETIN: Erythropoietin: 15.5 m[IU]/mL (ref 2.6–34.0)

## 2010-08-29 LAB — FOLATE: Folate: >20 ng/mL

## 2010-09-04 ENCOUNTER — Encounter (HOSPITAL_BASED_OUTPATIENT_CLINIC_OR_DEPARTMENT_OTHER): Payer: Medicare Other | Admitting: Oncology

## 2010-09-04 DIAGNOSIS — D638 Anemia in other chronic diseases classified elsewhere: Secondary | ICD-10-CM

## 2010-09-04 DIAGNOSIS — L4050 Arthropathic psoriasis, unspecified: Secondary | ICD-10-CM

## 2010-09-04 DIAGNOSIS — M869 Osteomyelitis, unspecified: Secondary | ICD-10-CM

## 2010-09-19 ENCOUNTER — Ambulatory Visit (INDEPENDENT_AMBULATORY_CARE_PROVIDER_SITE_OTHER): Payer: Medicare Other | Admitting: Infectious Diseases

## 2010-09-19 ENCOUNTER — Encounter: Payer: Self-pay | Admitting: Infectious Diseases

## 2010-09-19 VITALS — BP 162/82 | HR 67 | Temp 98.0°F | Ht 66.0 in | Wt 156.5 lb

## 2010-09-19 DIAGNOSIS — L02519 Cutaneous abscess of unspecified hand: Secondary | ICD-10-CM

## 2010-09-19 DIAGNOSIS — L03119 Cellulitis of unspecified part of limb: Secondary | ICD-10-CM

## 2010-09-19 MED ORDER — DOXYCYCLINE HYCLATE 100 MG PO TABS: 100.0000 mg | ORAL_TABLET | Freq: Two times a day (BID) | ORAL | Status: AC

## 2010-09-19 NOTE — Assessment & Plan Note (Addendum)
He is doing well. Will continue his doxy to August. Will defer repeat ing his MRI as his previous study did not show bony changes.He has not restarted his humira. Would defer this til he completes his anbx.  Will see him back prn.

## 2010-09-19 NOTE — Progress Notes (Signed)
Quick Note:  Pt aware of results. ______ 

## 2010-09-19 NOTE — Progress Notes (Signed)
  Subjective:    Patient ID: Johnny Patrick, male    DOB: 1945-06-19, 65 y.o.   MRN: [id number]  HPI 65 yo m with hx of DM2, psoriatic arthritis (prev on methotrexate and prednisone), prev R foot MSSA infection with sepsis 2009. He subsequently developed infection in his L second toe (MRI no osteo) And her underwent amputation 09-08-09. Cx's MSSA.  He was then seen in clinic Feb 2012 with cellulits of his R hand after a presumed cut to his hand. He completed a course of doxy for this.MRI showed no osteo, abscess or myofasciitis. By 3-13 he had worsening of swelling, erythema in his hand. He was rec to go to the ED which he refused. He got another 20 days of doxy after this then 10 days of bactrim (completed 07-02-10). Was seen in ID clinic 07-11-10 and was started on 30 more days of doxy (he was given choice of PIC or PO). He was seen again in ID clinic May 2012 and was to be continued on doxy to August 2012 with plan to re-image him then.  Today feels like his hand is better. Still has some swelling which he states is at his baseline. He has had difficulty with sunburning due to the doxy- is  Wearing long sleeves, wide brimmed hat. Not using sunscreen. No fevers or chills.   Review of Systems     Objective:   Physical Exam  Constitutional: He appears well-developed and well-nourished.  Musculoskeletal:       Arms: Skin:             Assessment & Plan:

## 2010-10-01 ENCOUNTER — Other Ambulatory Visit: Payer: Self-pay

## 2010-10-01 MED ORDER — CARVEDILOL 25 MG PO TABS: 25.0000 mg | ORAL_TABLET | Freq: Two times a day (BID) | ORAL | Status: DC

## 2010-11-14 ENCOUNTER — Encounter: Payer: Self-pay | Admitting: Infectious Diseases

## 2010-11-14 ENCOUNTER — Ambulatory Visit (INDEPENDENT_AMBULATORY_CARE_PROVIDER_SITE_OTHER): Payer: Medicare Other | Admitting: Infectious Diseases

## 2010-11-14 DIAGNOSIS — L02519 Cutaneous abscess of unspecified hand: Secondary | ICD-10-CM

## 2010-11-14 DIAGNOSIS — L03119 Cellulitis of unspecified part of limb: Secondary | ICD-10-CM

## 2010-11-14 NOTE — Progress Notes (Signed)
  Subjective:    Patient ID: Johnny Patrick, male    DOB: [birth year], 65 y.o.   MRN: [id number]  HPI 65 yo m with hx of DM2, psoriatic arthritis (prev on methotrexate and prednisone), prev L THR (02-2008), prev R foot MSSA infection with sepsis 2009. He subsequently developed infection in his L second toe (MRI no osteo) And her underwent amputation 09-08-09. Cx's MSSA.  He was then seen Feb 2012 with cellulits of his R hand after a presumed cut to his hand. He completed a course of doxy for this. MRI showed no osteo, abscess or myofasciitis. By 3-13 he had worsening of swelling, erythema in his hand. He was rec to go to the ED which he refused. He got another 20 days of doxy after this then 10 days of bactrim (completed 07-02-10). Was seen in ID clinic 07-11-10 and was started on 30 more days of doxy (he was given choice of PIC or PO). He was seen again in ID clinic May 2012 and was to be continued on doxy to August 2012.  His hand has since improved.  Has been feeling well, continuing on doxy til end of August. Has been feeling depressed as he is unable to physically unable to do what he wants/needs to.    Review of Systems  Constitutional: Negative for fever and chills.  Respiratory: Negative for cough, chest tightness and shortness of breath.   Gastrointestinal: Negative for diarrhea and constipation.  Genitourinary: Negative for dysuria.  Musculoskeletal: Positive for arthralgias.  Neurological: Negative for light-headedness and headaches.       Objective:   Physical Exam  Constitutional: He appears well-developed and well-nourished.  Eyes: EOM are normal. Pupils are equal, round, and reactive to light.  Neck: Neck supple.  Cardiovascular: Normal rate, regular rhythm and normal heart sounds.   Pulmonary/Chest: Effort normal and breath sounds normal.  Abdominal: Soft. Bowel sounds are normal. There is no tenderness. There is no rebound.  Lymphadenopathy:    He has no cervical adenopathy.    Skin:       He has diffuse erythematous areas from sun burn from the doxy. He has mild swelling of his R hand. There is no fluctuance, no increase in heat.           Assessment & Plan:

## 2010-11-14 NOTE — Assessment & Plan Note (Signed)
His hand is doing well. I suspect he had an atypical infection of his hand. He will complete his doxy later this month. He has multiple previous infections (MSSA) related to his immuno-suppresion and previous surgeries. I do not believe that he has persistence of these and would assume that his current risk is at his baseline. Would give him routine peri-operative anbx for the usual duration, for his hip replacement.  Most importantly, his blood pressure is very elevated today. He needs to follow up with his IM or Cv MD to have this corrected before he has surgery. I believe that this would be his greatest surgical risk at this time.  He will RTC prn

## 2010-11-23 ENCOUNTER — Other Ambulatory Visit: Payer: Self-pay | Admitting: *Deleted

## 2010-11-23 MED ORDER — BUDESONIDE-FORMOTEROL FUMARATE 160-4.5 MCG/ACT IN AERO: 2.0000 | INHALATION_SPRAY | Freq: Two times a day (BID) | RESPIRATORY_TRACT | Status: DC

## 2010-11-29 ENCOUNTER — Encounter: Payer: Self-pay | Admitting: Cardiology

## 2010-12-18 ENCOUNTER — Encounter: Payer: Self-pay | Admitting: Cardiology

## 2010-12-18 ENCOUNTER — Ambulatory Visit (INDEPENDENT_AMBULATORY_CARE_PROVIDER_SITE_OTHER): Payer: Medicare Other | Admitting: Cardiology

## 2010-12-18 ENCOUNTER — Encounter: Payer: Self-pay | Admitting: *Deleted

## 2010-12-18 VITALS — BP 132/74 | HR 77 | Ht 67.0 in | Wt 157.0 lb

## 2010-12-18 DIAGNOSIS — I10 Essential (primary) hypertension: Secondary | ICD-10-CM

## 2010-12-18 DIAGNOSIS — R609 Edema, unspecified: Secondary | ICD-10-CM

## 2010-12-18 DIAGNOSIS — E785 Hyperlipidemia, unspecified: Secondary | ICD-10-CM

## 2010-12-18 NOTE — Assessment & Plan Note (Signed)
Improved remarkably with good blood pressure control and not smoking. No change.

## 2010-12-18 NOTE — Assessment & Plan Note (Signed)
Good control. No change in medicines. Cleared for surgery at low cardiac risk.

## 2010-12-18 NOTE — Progress Notes (Signed)
HPI Mr. Johnny Patrick comes in today for preoperative clearance for right hip surgery with Dr. Cleophas Dunker.  His blood pressure seems to be under pretty good control. He is swelling is also remarkably improved in his lower extremities. He has a history of normal left ventricular function with some pulmonary hypertension. His chronic obstructive lung disease. He no longer smokes.  He denies orthopnea, PND, chest pain, palpitations presyncope or syncope.  He is compliant with his medications.  EKG shows normal sinus rhythm with left anterior fascicular block, no change. Past Medical History  Diagnosis Date  . Chronic airway obstruction, not elsewhere classified   . Esophageal reflux   . Unspecified essential hypertension   . Anemia   . Hypothyroidism   . Osteoarthritis   . Nephrolithiasis   . Psoriatic arthritis   . Low back pain   . 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 MSSA (methicillin susceptible Staphylococcus aureus) septicemia     L 2nd toe-amputated June 2011  . Shingles     x2    Past Surgical History  Procedure Date  . Total hip arthroplasty   . Thoracic spine surgery   . Toe amputation     Family History  Problem Relation Age of Onset  . Coronary artery disease Mother   . Pneumonia Mother   . Heart failure Mother     History   Social History  . Marital Status: Married    Spouse Name: N/A    Number of Children: N/A  . Years of Education: N/A   Occupational History  . retired    Social History Main Topics  . Smoking status: Former Smoker    Types: Cigarettes  . Smokeless tobacco: Former Neurosurgeon    Quit date: 04/08/1990  . Alcohol Use: No  . Drug Use: No  . Sexually Active: Not on file   Other Topics Concern  . Not on file   Social History Narrative  . No narrative on file    Allergies  Allergen Reactions  . WUJ:WJXBJYNWGNF+AOZHYQMVH+QIONGEXBMW Acid+Aspartame   . Sulfur Rash    Current Outpatient Prescriptions  Medication Sig Dispense Refill  .  Ascorbic Acid (VITAMIN C) 500 MG CHEW Chew 500 mg by mouth daily.        . budesonide-formoterol (SYMBICORT) 160-4.5 MCG/ACT inhaler Inhale 2 puffs into the lungs 2 (two) times daily.  1 Inhaler  2  . calcium carbonate (CALCIUM 600) 600 MG TABS Take 600 mg by mouth 3 (three) times daily.        . carvedilol (COREG) 25 MG tablet Take 1 tablet (25 mg total) by mouth 2 (two) times daily.  60 tablet  3  . Cholecalciferol 400 UNITS CAPS Take by mouth daily.        . cyclobenzaprine (FLEXERIL) 10 MG tablet Take 10 mg by mouth every 8 (eight) hours as needed.        . doxycycline (VIBRAMYCIN) 100 MG capsule Take 100 mg by mouth 2 (two) times daily.        . fentaNYL (DURAGESIC - DOSED MCG/HR) 100 MCG/HR Place 1 patch onto the skin every 3 (three) days.        . folic acid (FOLVITE) 1 MG tablet Take 1 mg by mouth daily.        . furosemide (LASIX) 40 MG tablet Take 80 mg by mouth daily.       Marland Kitchen gabapentin (NEURONTIN) 300 MG capsule Take 300 mg by mouth every 8 (eight) hours.        Marland Kitchen  HYDROmorphone (DILAUDID) 2 MG tablet Take 2 mg by mouth every 4 (four) hours.        Marland Kitchen leflunomide (ARAVA) 20 MG tablet Take 20 mg by mouth daily.        Marland Kitchen levothyroxine (SYNTHROID, LEVOTHROID) 150 MCG tablet Take 150 mcg by mouth daily.        Marland Kitchen losartan (COZAAR) 100 MG tablet TAKE ONE (1) TABLET EACH DAY  30 tablet  3  . Multiple Vitamin (MULTIVITAMIN) tablet Take 1 tablet by mouth daily.        . Omega-3 Fatty Acids (FISH OIL) 1000 MG CAPS Take by mouth 3 (three) times daily.        Marland Kitchen oxyCODONE-acetaminophen (ENDOCET) 10-650 MG per tablet Take 1 tablet by mouth every 8 (eight) hours as needed.        . potassium chloride SA (KLOR-CON M20) 20 MEQ tablet Take 1 tablet by mouth once a day per PCP       . predniSONE (DELTASONE) 5 MG tablet Take 5 mg by mouth daily.        Marland Kitchen Respiratory Therapy Supplies DEVI by Does not apply route.        . temazepam (RESTORIL) 15 MG capsule One or two at bedtime as needed for sleep  30  capsule  5    ROS Negative other than HPI.   PE General Appearance: well developed, well nourished in no acute distress HEENT: symmetrical face, PERRLA, good dentition  Neck: no JVD, thyromegaly, or adenopathy, trachea midline Chest: symmetric without deformity Cardiac: PMI non-displaced, RRR, normal S1, S2, no gallop or murmur Lung: clear to ausculation and percussion Vascular: all pulses full without bruits  Abdominal: nondistended, nontender, good bowel sounds, no HSM, no bruits Extremities: no cyanosis, clubbing, no significant edema, and no sign of DVT, no varicosities  Skin: normal color, no rashes Neuro: alert and oriented x 3, non-focal Pysch: normal affect. Filed Vitals:   12/18/10 1641  BP: 132/74  Pulse: 77  Height: 5\' 7"  (1.702 m)  Weight: 157 lb (71.215 kg)    EKG  Labs and Studies Reviewed.   Lab Results  Component Value Date   WBC 7.4 11/02/2009   HGB 11.3* 08/28/2010   HCT 33.5* 08/28/2010   MCV 88.7 08/28/2010   PLT 264 08/28/2010      Chemistry      Component Value Date/Time   NA 143 11/02/2009 1316   K 4.5 11/02/2009 1316   CL 98 11/02/2009 1316   CO2 33* 11/02/2009 1316   BUN 14 11/02/2009 1316   CREATININE 0.90 11/02/2009 1316   CREATININE 0.70 11/01/2009 1146      Component Value Date/Time   CALCIUM 9.2 11/02/2009 1316   ALKPHOS 213* 11/07/2009 0645   AST 23 11/07/2009 0645   ALT 16 11/07/2009 0645   BILITOT 0.5 11/07/2009 0645       Lab Results  Component Value Date   CHOL  Value: 136        ATP III CLASSIFICATION:  <200     mg/dL   Desirable  981-191  mg/dL   Borderline High  >=478    mg/dL   High        2/95/6213   Lab Results  Component Value Date   HDL 44 09/04/2009   Lab Results  Component Value Date   LDLCALC  Value: 60        Total Cholesterol/HDL:CHD Risk Coronary Heart Disease Risk Table  Men   Women  1/2 Average Risk   3.4   3.3  Average Risk       5.0   4.4  2 X Average Risk   9.6   7.1  3 X Average Risk  23.4    11.0        Use the calculated Patient Ratio above and the CHD Risk Table to determine the patient's CHD Risk.        ATP III CLASSIFICATION (LDL):  <100     mg/dL   Optimal  096-045  mg/dL   Near or Above                    Optimal  130-159  mg/dL   Borderline  409-811  mg/dL   High  >914     mg/dL   Very High 7/82/9562   Lab Results  Component Value Date   TRIG 158* 09/04/2009   Lab Results  Component Value Date   CHOLHDL 3.1 09/04/2009   Lab Results  Component Value Date   HGBA1C  Value: 8.8 (NOTE)                                                                       According to the ADA Clinical Practice Recommendations for 2011, when HbA1c is used as a screening test:   >=6.5%   Diagnostic of Diabetes Mellitus           (if abnormal result  is confirmed)  5.7-6.4%   Increased risk of developing Diabetes Mellitus  References:Diagnosis and Classification of Diabetes Mellitus,Diabetes Care,2011,34(Suppl 1):S62-S69 and Standards of Medical Care in         Diabetes - 2011,Diabetes Care,2011,34  (Suppl 1):S11-S61.* 09/05/2009   Lab Results  Component Value Date   ALT 16 11/07/2009   AST 23 11/07/2009   ALKPHOS 213* 11/07/2009   BILITOT 0.5 11/07/2009   Lab Results  Component Value Date   TSH 0.260 04/17/2009

## 2010-12-18 NOTE — Patient Instructions (Signed)
Your physician recommends that you schedule a follow-up appointment in: 6 months with Dr. Wall  

## 2010-12-24 ENCOUNTER — Encounter (HOSPITAL_COMMUNITY)
Admission: RE | Admit: 2010-12-24 | Discharge: 2010-12-24 | Disposition: A | Discharge status: Home / Self Care | Payer: Medicare Other | Source: Ambulatory Visit | Attending: Orthopaedic Surgery | Admitting: Orthopaedic Surgery

## 2010-12-24 LAB — COMPREHENSIVE METABOLIC PANEL
Alkaline Phosphatase: 72 U/L (ref 39–117)
BUN: 16 mg/dL (ref 6–23)
CO2: 32 mEq/L (ref 19–32)
Chloride: 102 mEq/L (ref 96–112)
GFR calc Af Amer: >60 mL/min (ref >60)
GFR calc non Af Amer: >60 mL/min (ref >60)
Glucose, Bld: 113 mg/dL — ABNORMAL HIGH (ref 70–99)
Potassium: 5 mEq/L (ref 3.5–5.1)
Total Bilirubin: 0.2 mg/dL — ABNORMAL LOW (ref 0.3–1.2)
Total Protein: 5.9 g/dL — ABNORMAL LOW (ref 6.0–8.3)

## 2010-12-24 LAB — DIFFERENTIAL
Basophils Absolute: 0 10*3/uL (ref 0.0–0.1)
Lymphocytes Relative: 17 % (ref 12–46)
Lymphs Abs: 1.3 10*3/uL (ref 0.7–4.0)
Neutro Abs: 5.4 10*3/uL (ref 1.7–7.7)
Neutrophils Relative %: 71 % (ref 43–77)

## 2010-12-24 LAB — CBC
HCT: 33.7 % — ABNORMAL LOW (ref 39.0–52.0)
MCV: 90.8 fL (ref 78.0–100.0)
RBC: 3.71 MIL/uL — ABNORMAL LOW (ref 4.22–5.81)
WBC: 7.7 10*3/uL (ref 4.0–10.5)

## 2010-12-24 LAB — PROTIME-INR: Prothrombin Time: 12.4 seconds (ref 11.6–15.2)

## 2010-12-24 LAB — URINALYSIS, ROUTINE W REFLEX MICROSCOPIC
Bilirubin Urine: NEGATIVE
Hgb urine dipstick: NEGATIVE
Nitrite: NEGATIVE
Specific Gravity, Urine: 1.026 (ref 1.005–1.030)
Urobilinogen, UA: 0.2 mg/dL (ref 0.0–1.0)
pH: 7 (ref 5.0–8.0)

## 2010-12-25 LAB — URINE CULTURE
Colony Count: 3000
Culture  Setup Time: 201209171055

## 2011-01-01 ENCOUNTER — Inpatient Hospital Stay (HOSPITAL_COMMUNITY): Payer: Medicare Other

## 2011-01-01 ENCOUNTER — Inpatient Hospital Stay (HOSPITAL_COMMUNITY)
Admission: RE | Admit: 2011-01-01 | Discharge: 2011-01-04 | DRG: 470 | Disposition: A | Discharge status: Home Health | Payer: Medicare Other | Source: Ambulatory Visit | Attending: Orthopaedic Surgery | Admitting: Orthopaedic Surgery

## 2011-01-01 DIAGNOSIS — Z23 Encounter for immunization: Secondary | ICD-10-CM

## 2011-01-01 DIAGNOSIS — G4730 Sleep apnea, unspecified: Secondary | ICD-10-CM | POA: Diagnosis present

## 2011-01-01 DIAGNOSIS — M1610 Unilateral primary osteoarthritis, unspecified hip: Principal | ICD-10-CM | POA: Diagnosis present

## 2011-01-01 DIAGNOSIS — M169 Osteoarthritis of hip, unspecified: Principal | ICD-10-CM | POA: Diagnosis present

## 2011-01-01 DIAGNOSIS — Z79899 Other long term (current) drug therapy: Secondary | ICD-10-CM

## 2011-01-01 DIAGNOSIS — Z01812 Encounter for preprocedural laboratory examination: Secondary | ICD-10-CM

## 2011-01-01 DIAGNOSIS — E119 Type 2 diabetes mellitus without complications: Secondary | ICD-10-CM | POA: Diagnosis present

## 2011-01-01 DIAGNOSIS — IMO0002 Reserved for concepts with insufficient information to code with codable children: Secondary | ICD-10-CM

## 2011-01-01 DIAGNOSIS — E039 Hypothyroidism, unspecified: Secondary | ICD-10-CM | POA: Diagnosis present

## 2011-01-01 DIAGNOSIS — I10 Essential (primary) hypertension: Secondary | ICD-10-CM | POA: Diagnosis present

## 2011-01-01 DIAGNOSIS — D62 Acute posthemorrhagic anemia: Secondary | ICD-10-CM | POA: Diagnosis not present

## 2011-01-01 DIAGNOSIS — J4489 Other specified chronic obstructive pulmonary disease: Secondary | ICD-10-CM | POA: Diagnosis present

## 2011-01-01 DIAGNOSIS — L4050 Arthropathic psoriasis, unspecified: Secondary | ICD-10-CM | POA: Diagnosis present

## 2011-01-01 DIAGNOSIS — J449 Chronic obstructive pulmonary disease, unspecified: Secondary | ICD-10-CM | POA: Diagnosis present

## 2011-01-01 LAB — CBC
HCT: 28.4 % — ABNORMAL LOW (ref 39.0–52.0)
Hemoglobin: 9.4 g/dL — ABNORMAL LOW (ref 13.0–17.0)
MCHC: 33.1 g/dL (ref 30.0–36.0)
MCV: 90.7 fL (ref 78.0–100.0)
RDW: 15.6 % — ABNORMAL HIGH (ref 11.5–15.5)

## 2011-01-01 LAB — BASIC METABOLIC PANEL
BUN: 14 mg/dL (ref 6–23)
Chloride: 101 mEq/L (ref 96–112)
Creatinine, Ser: 0.62 mg/dL (ref 0.50–1.35)
GFR calc Af Amer: >60 mL/min (ref >60)
GFR calc non Af Amer: >60 mL/min (ref >60)
Glucose, Bld: 137 mg/dL — ABNORMAL HIGH (ref 70–99)

## 2011-01-02 LAB — CBC
Hemoglobin: 8.7 g/dL — ABNORMAL LOW (ref 13.0–17.0)
MCH: 29.6 pg (ref 26.0–34.0)
Platelets: 195 10*3/uL (ref 150–400)
RBC: 2.94 MIL/uL — ABNORMAL LOW (ref 4.22–5.81)
WBC: 10.1 10*3/uL (ref 4.0–10.5)

## 2011-01-02 LAB — BASIC METABOLIC PANEL
CO2: 29 mEq/L (ref 19–32)
Calcium: 8.9 mg/dL (ref 8.4–10.5)
Chloride: 101 mEq/L (ref 96–112)
Glucose, Bld: 126 mg/dL — ABNORMAL HIGH (ref 70–99)
Potassium: 4.1 mEq/L (ref 3.5–5.1)
Sodium: 136 mEq/L (ref 135–145)

## 2011-01-02 LAB — HEMOGLOBIN A1C
Hgb A1c MFr Bld: 6.3 % — ABNORMAL HIGH (ref <5.7)
Mean Plasma Glucose: 134 mg/dL — ABNORMAL HIGH (ref <117)

## 2011-01-02 LAB — GLUCOSE, CAPILLARY
Glucose-Capillary: 113 mg/dL — ABNORMAL HIGH (ref 70–99)
Glucose-Capillary: 169 mg/dL — ABNORMAL HIGH (ref 70–99)
Glucose-Capillary: 147 mg/dL — ABNORMAL HIGH (ref 70–99)
Glucose-Capillary: 88 mg/dL (ref 70–99)

## 2011-01-03 LAB — CULTURE, ROUTINE-ABSCESS: Culture: NO GROWTH

## 2011-01-03 LAB — CBC
HCT: 27.2 % — ABNORMAL LOW (ref 39.0–52.0)
MCH: 29.2 pg (ref 26.0–34.0)
MCHC: 33.5 g/dL (ref 30.0–36.0)
MCV: 87.2 fL (ref 78.0–100.0)
Platelets: 172 10*3/uL (ref 150–400)
RDW: 16 % — ABNORMAL HIGH (ref 11.5–15.5)

## 2011-01-03 LAB — BASIC METABOLIC PANEL
BUN: 12 mg/dL (ref 6–23)
Calcium: 8.9 mg/dL (ref 8.4–10.5)
Chloride: 100 mEq/L (ref 96–112)
Creatinine, Ser: 0.58 mg/dL (ref 0.50–1.35)
GFR calc Af Amer: >60 mL/min (ref >60)

## 2011-01-03 LAB — GLUCOSE, CAPILLARY
Glucose-Capillary: 98 mg/dL (ref 70–99)
Glucose-Capillary: 102 mg/dL — ABNORMAL HIGH (ref 70–99)

## 2011-01-03 LAB — POCT HEMOGLOBIN-HEMACUE: Hemoglobin: 11.2 — ABNORMAL LOW

## 2011-01-04 LAB — DIFFERENTIAL
Basophils Absolute: 0
Basophils Absolute: 0
Basophils Absolute: 0
Basophils Absolute: 0
Basophils Absolute: 0
Basophils Relative: 0
Basophils Relative: 0
Basophils Relative: 0
Basophils Relative: 0
Basophils Relative: 1
Basophils Relative: 1
Basophils Relative: 1
Basophils Relative: 1
Eosinophils Absolute: 0
Eosinophils Absolute: 0
Eosinophils Absolute: 0
Eosinophils Absolute: 0
Eosinophils Absolute: 0
Eosinophils Absolute: 0.1
Eosinophils Absolute: 0.1
Eosinophils Absolute: 0.1
Eosinophils Absolute: 0
Eosinophils Absolute: 0.1
Eosinophils Relative: 0
Eosinophils Relative: 0
Eosinophils Relative: 0
Eosinophils Relative: 0
Eosinophils Relative: 1
Eosinophils Relative: 1
Eosinophils Relative: 1
Eosinophils Relative: 1
Lymphocytes Relative: 10 — ABNORMAL LOW
Lymphocytes Relative: 21
Lymphocytes Relative: 29
Lymphocytes Relative: 35
Lymphocytes Relative: 35
Lymphocytes Relative: 7 — ABNORMAL LOW
Lymphocytes Relative: 8 — ABNORMAL LOW
Lymphocytes Relative: 31
Lymphocytes Relative: 37
Lymphocytes Relative: 38
Lymphs Abs: 0.2 — ABNORMAL LOW
Lymphs Abs: 0.2 — ABNORMAL LOW
Lymphs Abs: 1.1
Lymphs Abs: 1.6
Lymphs Abs: 2
Lymphs Abs: 2.1
Lymphs Abs: 2.1
Lymphs Abs: 1.9
Lymphs Abs: 2.2
Lymphs Abs: 2.5
Lymphs Abs: 2.7
Monocytes Absolute: 0.1
Monocytes Absolute: 0.2
Monocytes Absolute: 0.3
Monocytes Absolute: 0.4
Monocytes Absolute: 0.4
Monocytes Absolute: 0.5
Monocytes Absolute: 0.7
Monocytes Absolute: 0.7
Monocytes Absolute: 0.8
Monocytes Relative: 13 — ABNORMAL HIGH
Monocytes Relative: 15 — ABNORMAL HIGH
Monocytes Relative: 5
Monocytes Relative: 6
Monocytes Relative: 6
Neutro Abs: 2.2
Neutro Abs: 3.6
Neutro Abs: 2.1
Neutrophils Relative %: 54
Neutrophils Relative %: 57
Neutrophils Relative %: 58
Neutrophils Relative %: 66
Neutrophils Relative %: 71
Neutrophils Relative %: 72
Neutrophils Relative %: 87 — ABNORMAL HIGH
Neutrophils Relative %: 87 — ABNORMAL HIGH
Neutrophils Relative %: 43
Neutrophils Relative %: 50
Neutrophils Relative %: 52

## 2011-01-04 LAB — GLUCOSE, CAPILLARY
Glucose-Capillary: 105 mg/dL — ABNORMAL HIGH (ref 70–99)
Glucose-Capillary: 104 — ABNORMAL HIGH
Glucose-Capillary: 107 — ABNORMAL HIGH
Glucose-Capillary: 108 — ABNORMAL HIGH
Glucose-Capillary: 108 — ABNORMAL HIGH
Glucose-Capillary: 116 — ABNORMAL HIGH
Glucose-Capillary: 116 — ABNORMAL HIGH
Glucose-Capillary: 120 — ABNORMAL HIGH
Glucose-Capillary: 129 — ABNORMAL HIGH
Glucose-Capillary: 129 — ABNORMAL HIGH
Glucose-Capillary: 133 — ABNORMAL HIGH
Glucose-Capillary: 137 — ABNORMAL HIGH
Glucose-Capillary: 139 — ABNORMAL HIGH
Glucose-Capillary: 141 — ABNORMAL HIGH
Glucose-Capillary: 143 — ABNORMAL HIGH
Glucose-Capillary: 145 — ABNORMAL HIGH
Glucose-Capillary: 161 — ABNORMAL HIGH
Glucose-Capillary: 162 — ABNORMAL HIGH
Glucose-Capillary: 177 — ABNORMAL HIGH
Glucose-Capillary: 178 — ABNORMAL HIGH
Glucose-Capillary: 188 — ABNORMAL HIGH
Glucose-Capillary: 189 — ABNORMAL HIGH
Glucose-Capillary: 199 — ABNORMAL HIGH
Glucose-Capillary: 200 — ABNORMAL HIGH
Glucose-Capillary: 212 — ABNORMAL HIGH
Glucose-Capillary: 216 — ABNORMAL HIGH
Glucose-Capillary: 248 — ABNORMAL HIGH
Glucose-Capillary: 85
Glucose-Capillary: 87
Glucose-Capillary: 96
Glucose-Capillary: 98
Glucose-Capillary: 144 — ABNORMAL HIGH
Glucose-Capillary: 146 — ABNORMAL HIGH
Glucose-Capillary: 173 — ABNORMAL HIGH
Glucose-Capillary: 58 — ABNORMAL LOW
Glucose-Capillary: 67 — ABNORMAL LOW
Glucose-Capillary: 71
Glucose-Capillary: 72
Glucose-Capillary: 84
Glucose-Capillary: 86
Glucose-Capillary: 94

## 2011-01-04 LAB — CBC
HCT: 25.8 % — ABNORMAL LOW (ref 39.0–52.0)
HCT: 26.5 — ABNORMAL LOW
HCT: 30.5 — ABNORMAL LOW
HCT: 31.4 — ABNORMAL LOW
HCT: 32.7 — ABNORMAL LOW
HCT: 34.7 — ABNORMAL LOW
HCT: 35.2 — ABNORMAL LOW
HCT: 28 — ABNORMAL LOW
HCT: 34.4 — ABNORMAL LOW
HCT: 36.3 — ABNORMAL LOW
Hemoglobin: 10.6 — ABNORMAL LOW
Hemoglobin: 10.7 — ABNORMAL LOW
Hemoglobin: 11.1 — ABNORMAL LOW
Hemoglobin: 11.3 — ABNORMAL LOW
Hemoglobin: 11.7 — ABNORMAL LOW
Hemoglobin: 11.8 — ABNORMAL LOW
Hemoglobin: 9 — ABNORMAL LOW
Hemoglobin: 9.5 — ABNORMAL LOW
MCH: 29.2 pg (ref 26.0–34.0)
MCHC: 33.6
MCHC: 33.6
MCHC: 33.9
MCHC: 33.9
MCHC: 34.1
MCHC: 34.9
MCHC: 33.3
MCHC: 33.8
MCHC: 34.1
MCV: 87.5 fL (ref 78.0–100.0)
MCV: 87.1
MCV: 87.5
MCV: 87.8
MCV: 88.2
MCV: 88.3
MCV: 88.9
MCV: 89.3
MCV: 87.5
MCV: 89.1
Platelets: 183 10*3/uL (ref 150–400)
Platelets: 60 — ABNORMAL LOW
Platelets: 63 — ABNORMAL LOW
Platelets: 63 — ABNORMAL LOW
Platelets: 73 — ABNORMAL LOW
Platelets: 94 — ABNORMAL LOW
Platelets: 264
Platelets: 173
Platelets: 200
Platelets: 239
RBC: 3.04 — ABNORMAL LOW
RBC: 3.47 — ABNORMAL LOW
RBC: 3.51 — ABNORMAL LOW
RBC: 3.59 — ABNORMAL LOW
RBC: 3.61 — ABNORMAL LOW
RBC: 3.94 — ABNORMAL LOW
RBC: 3.94 — ABNORMAL LOW
RBC: 3.77 — ABNORMAL LOW
RBC: 4.06 — ABNORMAL LOW
RDW: 15.6 % — ABNORMAL HIGH (ref 11.5–15.5)
RDW: 16.4 — ABNORMAL HIGH
RDW: 16.4 — ABNORMAL HIGH
RDW: 16.5 — ABNORMAL HIGH
RDW: 16.7 — ABNORMAL HIGH
RDW: 16.8 — ABNORMAL HIGH
RDW: 17 — ABNORMAL HIGH
RDW: 17.1 — ABNORMAL HIGH
RDW: 17.2 — ABNORMAL HIGH
RDW: 16.4 — ABNORMAL HIGH
RDW: 16.5 — ABNORMAL HIGH
WBC: 2.6 — ABNORMAL LOW
WBC: 6
WBC: 6.8
WBC: 7.1
WBC: 6.1
WBC: 6.5
WBC: 6.8
WBC: 7.2

## 2011-01-04 LAB — CARDIAC PANEL(CRET KIN+CKTOT+MB+TROPI)
CK, MB: 6.4 — ABNORMAL HIGH
CK, MB: 6.7 — ABNORMAL HIGH
CK, MB: 6.8 — ABNORMAL HIGH
Total CK: 1057 — ABNORMAL HIGH
Total CK: 1181 — ABNORMAL HIGH
Total CK: 1877 — ABNORMAL HIGH
Troponin I: 3.5
Troponin I: 4.61

## 2011-01-04 LAB — CARBOXYHEMOGLOBIN
Carboxyhemoglobin: 0.5
Carboxyhemoglobin: 0.7
Carboxyhemoglobin: 0.8
Carboxyhemoglobin: 0.9
Methemoglobin: 1.6 — ABNORMAL HIGH
O2 Saturation: 50.9
O2 Saturation: 59.1
O2 Saturation: 61.5
Total hemoglobin: 8.4 — ABNORMAL LOW
Total hemoglobin: 9.6 — ABNORMAL LOW
Total hemoglobin: 9.8 — ABNORMAL LOW

## 2011-01-04 LAB — COMPREHENSIVE METABOLIC PANEL
ALT: 132 — ABNORMAL HIGH
ALT: 144 — ABNORMAL HIGH
ALT: 233 — ABNORMAL HIGH
ALT: 288 — ABNORMAL HIGH
ALT: 324 — ABNORMAL HIGH
ALT: 37
ALT: 41
AST: 113 — ABNORMAL HIGH
AST: 182 — ABNORMAL HIGH
AST: 464 — ABNORMAL HIGH
AST: 550 — ABNORMAL HIGH
AST: 853 — ABNORMAL HIGH
AST: 36
Albumin: 1.6 — ABNORMAL LOW
Albumin: 1.8 — ABNORMAL LOW
Albumin: 1.8 — ABNORMAL LOW
Albumin: 2 — ABNORMAL LOW
Albumin: 2.4 — ABNORMAL LOW
Alkaline Phosphatase: 245 — ABNORMAL HIGH
Alkaline Phosphatase: 409 — ABNORMAL HIGH
Alkaline Phosphatase: 461 — ABNORMAL HIGH
Alkaline Phosphatase: 487 — ABNORMAL HIGH
Alkaline Phosphatase: 515 — ABNORMAL HIGH
Alkaline Phosphatase: 140 — ABNORMAL HIGH
BUN: 22
BUN: 37 — ABNORMAL HIGH
BUN: 37 — ABNORMAL HIGH
CO2: 18 — ABNORMAL LOW
CO2: 20
CO2: 25
CO2: 31
CO2: 32
CO2: 30
Calcium: 7.5 — ABNORMAL LOW
Calcium: 8 — ABNORMAL LOW
Calcium: 8.1 — ABNORMAL LOW
Calcium: 8.4
Chloride: 101
Chloride: 109
Chloride: 116 — ABNORMAL HIGH
Chloride: 117 — ABNORMAL HIGH
Chloride: 109
Chloride: 99
Creatinine, Ser: 0.57
Creatinine, Ser: 0.63
Creatinine, Ser: 0.66
Creatinine, Ser: 0.92
Creatinine, Ser: 0.98
Creatinine, Ser: 1.11
Creatinine, Ser: 1.28
GFR calc Af Amer: >60
GFR calc Af Amer: >60
GFR calc Af Amer: >60
GFR calc Af Amer: >60
GFR calc Af Amer: >60
GFR calc non Af Amer: >60
GFR calc non Af Amer: >60
GFR calc non Af Amer: >60
GFR calc non Af Amer: >60
GFR calc non Af Amer: >60
Glucose, Bld: 109 — ABNORMAL HIGH
Glucose, Bld: 113 — ABNORMAL HIGH
Glucose, Bld: 146 — ABNORMAL HIGH
Glucose, Bld: 180 — ABNORMAL HIGH
Glucose, Bld: 210 — ABNORMAL HIGH
Glucose, Bld: 212 — ABNORMAL HIGH
Glucose, Bld: 79
Potassium: 3.1 — ABNORMAL LOW
Potassium: 4
Potassium: 4.2
Potassium: 4.5
Potassium: 4.9
Potassium: 4.3
Sodium: 139
Sodium: 141
Sodium: 142
Sodium: 145
Sodium: 146 — ABNORMAL HIGH
Sodium: 146 — ABNORMAL HIGH
Sodium: 137
Sodium: 139
Total Bilirubin: 0.7
Total Bilirubin: 0.7
Total Bilirubin: 0.7
Total Bilirubin: 0.8
Total Bilirubin: 0.7
Total Protein: 3.6 — ABNORMAL LOW
Total Protein: 3.6 — ABNORMAL LOW
Total Protein: 3.9 — ABNORMAL LOW
Total Protein: 4.7 — ABNORMAL LOW
Total Protein: 4.5 — ABNORMAL LOW

## 2011-01-04 LAB — BASIC METABOLIC PANEL WITH GFR
BUN: 48 — ABNORMAL HIGH
Chloride: 99
Creatinine, Ser: 1.92 — ABNORMAL HIGH
GFR calc non Af Amer: 35 — ABNORMAL LOW
Glucose, Bld: 178 — ABNORMAL HIGH
Potassium: 4.2

## 2011-01-04 LAB — URINALYSIS, ROUTINE W REFLEX MICROSCOPIC
Bilirubin Urine: NEGATIVE
Glucose, UA: NEGATIVE
Glucose, UA: NEGATIVE
Ketones, ur: NEGATIVE
Leukocytes, UA: NEGATIVE
Nitrite: NEGATIVE
Protein, ur: 30 — AB
Specific Gravity, Urine: 1.02
Specific Gravity, Urine: 1.019
Urobilinogen, UA: 0.2
pH: 5.5
pH: 5

## 2011-01-04 LAB — URINE CULTURE
Colony Count: NO GROWTH
Colony Count: NO GROWTH
Culture: NO GROWTH

## 2011-01-04 LAB — CULTURE, BLOOD (ROUTINE X 2)
Culture: NO GROWTH
Culture: NO GROWTH

## 2011-01-04 LAB — MAGNESIUM
Magnesium: 2.2
Magnesium: 2.5

## 2011-01-04 LAB — PROTIME-INR
INR: 0.9
INR: 1
INR: 1.1
INR: 1.1
Prothrombin Time: 12.6
Prothrombin Time: 15.2

## 2011-01-04 LAB — CLOSTRIDIUM DIFFICILE EIA
C difficile Toxins A+B, EIA: NEGATIVE
C difficile Toxins A+B, EIA: NEGATIVE

## 2011-01-04 LAB — BLOOD GAS, VENOUS
Bicarbonate: 24.5 — ABNORMAL HIGH
TCO2: 22.3
pCO2, Ven: 46.8
pH, Ven: 7.346 — ABNORMAL HIGH
pO2, Ven: 32.6

## 2011-01-04 LAB — BASIC METABOLIC PANEL
BUN: 10 mg/dL (ref 6–23)
BUN: 38 — ABNORMAL HIGH
BUN: 40 — ABNORMAL HIGH
CO2: 29 mEq/L (ref 19–32)
CO2: 25
CO2: 27
CO2: 27
Calcium: 8.9 mg/dL (ref 8.4–10.5)
Calcium: 8.4
Calcium: 7.8 — ABNORMAL LOW
Calcium: 8.2 — ABNORMAL LOW
Chloride: 112
Chloride: 118 — ABNORMAL HIGH
Chloride: 106
Creatinine, Ser: 0.54 mg/dL (ref 0.50–1.35)
Creatinine, Ser: 0.87
Creatinine, Ser: 0.97
Creatinine, Ser: 0.97
Creatinine, Ser: 0.81
GFR calc Af Amer: >60 mL/min (ref >60)
GFR calc Af Amer: 43 — ABNORMAL LOW
GFR calc Af Amer: >60
GFR calc Af Amer: >60
GFR calc Af Amer: >60
GFR calc non Af Amer: >60
GFR calc non Af Amer: >60
Glucose, Bld: 148 — ABNORMAL HIGH
Glucose, Bld: 104 — ABNORMAL HIGH
Potassium: 3.8
Potassium: 4.3
Sodium: 133 — ABNORMAL LOW
Sodium: 147 — ABNORMAL HIGH
Sodium: 138

## 2011-01-04 LAB — EPSTEIN-BARR VIRUS VCA ANTIBODY PANEL
EBV EA IgG: 0.69
EBV NA IgG: 4.18 — ABNORMAL HIGH

## 2011-01-04 LAB — URINE MICROSCOPIC-ADD ON

## 2011-01-04 LAB — B-NATRIURETIC PEPTIDE (CONVERTED LAB)
Pro B Natriuretic peptide (BNP): 123 — ABNORMAL HIGH
Pro B Natriuretic peptide (BNP): 245 — ABNORMAL HIGH

## 2011-01-04 LAB — BLOOD GAS, ARTERIAL
Acid-base deficit: 2.1 — ABNORMAL HIGH
Acid-base deficit: 4.5 — ABNORMAL HIGH
Acid-base deficit: 6 — ABNORMAL HIGH
Bicarbonate: 15.7 — ABNORMAL LOW
Bicarbonate: 17.5 — ABNORMAL LOW
Bicarbonate: 19.4 — ABNORMAL LOW
Drawn by: 129801
Drawn by: 232811
Drawn by: 232811
Drawn by: 235321
FIO2: 0.3
FIO2: 0.3
FIO2: 0.5
FIO2: 0.5
FIO2: 1
MECHVT: 460
MECHVT: 500
O2 Saturation: 73
O2 Saturation: 98.4
O2 Saturation: 98.9
PEEP: 10
PEEP: 8
Patient temperature: 98.6
Patient temperature: 99.3
RATE: 22
RATE: 22
RATE: 22
TCO2: 14.5
TCO2: 16.1
TCO2: 17.6
pCO2 arterial: 25 — ABNORMAL LOW
pCO2 arterial: 29.1 — ABNORMAL LOW
pCO2 arterial: 29.5 — ABNORMAL LOW
pCO2 arterial: 31.3 — ABNORMAL LOW
pH, Arterial: 7.363
pH, Arterial: 7.379
pH, Arterial: 7.39
pH, Arterial: 7.424
pH, Arterial: 7.502 — ABNORMAL HIGH
pO2, Arterial: 122 — ABNORMAL HIGH
pO2, Arterial: 137 — ABNORMAL HIGH
pO2, Arterial: 370 — ABNORMAL HIGH
pO2, Arterial: 42.5 — ABNORMAL LOW

## 2011-01-04 LAB — FUNGUS CULTURE W SMEAR: Fungal Smear: NONE SEEN

## 2011-01-04 LAB — HEPATIC FUNCTION PANEL
ALT: 178 — ABNORMAL HIGH
AST: 295 — ABNORMAL HIGH
Albumin: 2.1 — ABNORMAL LOW
Albumin: 2.2 — ABNORMAL LOW
Alkaline Phosphatase: 261 — ABNORMAL HIGH
Indirect Bilirubin: 0.8
Total Protein: 4.3 — ABNORMAL LOW
Total Protein: 4.7 — ABNORMAL LOW

## 2011-01-04 LAB — FUNGUS CULTURE, BLOOD

## 2011-01-04 LAB — RAPID URINE DRUG SCREEN, HOSP PERFORMED
Amphetamines: NOT DETECTED
Opiates: NOT DETECTED
Tetrahydrocannabinol: NOT DETECTED

## 2011-01-04 LAB — LACTIC ACID, PLASMA
Lactic Acid, Venous: 2
Lactic Acid, Venous: 2.1

## 2011-01-04 LAB — APTT
aPTT: 41 — ABNORMAL HIGH
aPTT: 48 — ABNORMAL HIGH

## 2011-01-04 LAB — TYPE AND SCREEN: ABO/RH(D): O POS

## 2011-01-04 LAB — HEPATITIS C ANTIBODY: HCV Ab: NEGATIVE

## 2011-01-04 LAB — SAMPLE TO BLOOD BANK

## 2011-01-04 LAB — OCCULT BLOOD X 1 CARD TO LAB, STOOL
Fecal Occult Bld: NEGATIVE
Fecal Occult Bld: NEGATIVE

## 2011-01-04 LAB — AMMONIA
Ammonia: 47 — ABNORMAL HIGH
Ammonia: 56 — ABNORMAL HIGH
Ammonia: 88 — ABNORMAL HIGH

## 2011-01-04 LAB — HEMOGLOBIN A1C: Mean Plasma Glucose: 193

## 2011-01-04 LAB — STOOL CULTURE

## 2011-01-04 LAB — HCV RNA QUANT

## 2011-01-04 LAB — HEMOGLOBIN AND HEMATOCRIT, BLOOD: Hemoglobin: 8.8 — ABNORMAL LOW

## 2011-01-04 LAB — FECAL LACTOFERRIN, QUANT

## 2011-01-04 LAB — CYTOMEGALOVIRUS PCR, QUALITATIVE: Cytomegalovirus DNA: NOT DETECTED

## 2011-01-04 LAB — CK: Total CK: 327 — ABNORMAL HIGH

## 2011-01-04 LAB — BODY FLUID CULTURE: Gram Stain: NONE SEEN

## 2011-01-04 LAB — CMV ABS, IGG+IGM (CYTOMEGALOVIRUS): CMV IgM: <0.9 Index (ref <0.90)

## 2011-01-04 LAB — CORTISOL: Cortisol, Plasma: 38.2

## 2011-01-04 LAB — FOLATE: Folate: >20

## 2011-01-04 LAB — TROPONIN I: Troponin I: 0.51

## 2011-01-04 LAB — VITAMIN B12: Vitamin B-12: 688 (ref 211–911)

## 2011-01-04 LAB — PREPARE RBC (CROSSMATCH)

## 2011-01-06 LAB — TYPE AND SCREEN
ABO/RH(D): O POS
Unit division: 0

## 2011-01-07 ENCOUNTER — Other Ambulatory Visit: Payer: Self-pay | Admitting: *Deleted

## 2011-01-07 MED ORDER — LOSARTAN POTASSIUM 100 MG PO TABS: 100.0000 mg | ORAL_TABLET | Freq: Every day | ORAL | Status: DC

## 2011-01-08 LAB — CROSSMATCH
ABO/RH(D): O POS
Antibody Screen: NEGATIVE
Antibody Screen: NEGATIVE

## 2011-01-08 LAB — DIFFERENTIAL
Basophils Absolute: 0
Basophils Relative: 1
Eosinophils Absolute: 0.1
Neutro Abs: 4.5
Neutrophils Relative %: 73

## 2011-01-08 LAB — BASIC METABOLIC PANEL
BUN: 10
BUN: 12
CO2: 26
CO2: 26
CO2: 28
CO2: 30
Calcium: 8 — ABNORMAL LOW
Calcium: 8.3 — ABNORMAL LOW
Calcium: 8.7
Chloride: 105
Creatinine, Ser: 0.73
Creatinine, Ser: 0.85
Creatinine, Ser: 0.92
GFR calc non Af Amer: 58 — ABNORMAL LOW
Glucose, Bld: 123 — ABNORMAL HIGH
Glucose, Bld: 145 — ABNORMAL HIGH
Glucose, Bld: 159 — ABNORMAL HIGH
Glucose, Bld: 167 — ABNORMAL HIGH
Sodium: 135

## 2011-01-08 LAB — PROTIME-INR
INR: 0.9
INR: 1
INR: 1.4
Prothrombin Time: 12.4
Prothrombin Time: 13.5
Prothrombin Time: 16.9 — ABNORMAL HIGH

## 2011-01-08 LAB — GRAM STAIN

## 2011-01-08 LAB — COMPREHENSIVE METABOLIC PANEL
Alkaline Phosphatase: 75
BUN: 23
Chloride: 104
Creatinine, Ser: 0.79
GFR calc Af Amer: >60
Glucose, Bld: 160 — ABNORMAL HIGH
Sodium: 137
Total Bilirubin: 0.6

## 2011-01-08 LAB — CULTURE, BLOOD (ROUTINE X 2): Culture: NO GROWTH

## 2011-01-08 LAB — TISSUE CULTURE: Culture: NO GROWTH

## 2011-01-08 LAB — CBC
Hemoglobin: 8.5 — ABNORMAL LOW
MCHC: 33.2
MCHC: 33.4
MCHC: 33.6
MCHC: 33.7
MCHC: 34.5
MCV: 90.5
Platelets: 181
Platelets: 204
RBC: 4.26
RDW: 15
RDW: 15
RDW: 15.2
RDW: 15.3
WBC: 6.2
WBC: 6.3

## 2011-01-08 LAB — URINALYSIS, ROUTINE W REFLEX MICROSCOPIC
Hgb urine dipstick: NEGATIVE
Nitrite: NEGATIVE
Protein, ur: NEGATIVE
Urobilinogen, UA: 0.2

## 2011-01-08 LAB — ANAEROBIC CULTURE

## 2011-01-08 LAB — SEDIMENTATION RATE: Sed Rate: 24 — ABNORMAL HIGH

## 2011-01-08 LAB — C-REACTIVE PROTEIN: CRP: 0.4 — ABNORMAL LOW (ref <0.6)

## 2011-01-08 LAB — URINE CULTURE

## 2011-01-08 LAB — BODY FLUID CULTURE

## 2011-01-08 LAB — ABO/RH: ABO/RH(D): O POS

## 2011-01-11 LAB — ANAEROBIC CULTURE

## 2011-01-11 LAB — POCT HEMOGLOBIN-HEMACUE
Hemoglobin: 10.4 — ABNORMAL LOW
Operator id: 123881

## 2011-01-11 LAB — BASIC METABOLIC PANEL
BUN: 23
CO2: 31
Calcium: 8.4
Creatinine, Ser: 1.29
GFR calc Af Amer: >60

## 2011-01-11 LAB — WOUND CULTURE

## 2011-01-24 NOTE — Op Note (Signed)
NAMEPAYTEN, BEAUMIER                ACCOUNT NO.:  [id number]  MEDICAL RECORD NO.:  [id number]  LOCATION:  2550                         FACILITY:  [facility]  PHYSICIAN:  Claude Manges. Whitfield, M.D.DATE OF BIRTH:  1945/09/10  DATE OF PROCEDURE:  01/01/2011 DATE OF DISCHARGE:                              OPERATIVE REPORT   PREOPERATIVE DIAGNOSIS:  End-stage osteoarthritis, right hip.  POSTOPERATIVE DIAGNOSIS:  End-stage osteoarthritis, right hip.  PROCEDURE:  Right total hip replacement.  SURGEON:  Claude Manges. Cleophas Dunker, MD  ASSISTANT:  Oris Drone. Petrarca, PA-C  ANESTHESIA:  General.  COMPLICATIONS:  None.  COMPONENTS:  DePuy AML large stature 15-mm femoral component, a 36-mm outer diameter hip ball with a +12 mm neck length, a 60-mm outer diameter sector II Porocoat acetabular shell with an apex hole eliminator, and a pedicle Marathon polyethylene liner +4, 10-degree posterior lip.  Components were Press-Fit.  PROCEDURE IN DETAIL:  Mr. Delfavero was met in the holding area and identified as the right hip was the appropriate operative site.  I measured his leg lengths and it appeared to be symmetrical.  Skin was clear about the right hip.  The patient was then transported room #1 and placed under general orotracheal anesthesia.  At that point, he was placed in the lateral decubitus position with the right side up and secured to the operating room table with the Innomed hip system.  The right lower extremity was prepped from the iliac crest to the distal leg with Betadine scrub and then DuraPrep.  Sterile draping was performed.  A routine southern incision was utilized and via sharp dissection carried down to the subcutaneous tissue.  Gross bleeders were Bovie coagulated.  The iliotibial band was quickly identified and incised along the length of the skin incision.  Self-retaining retractors were inserted.  With the hip internally rotated, the short external rotators were identified.   Tendinous structures were tagged with 0 Ethibond suture.  The hip capsule was identified and incised along the femoral neck and head.  There was a small clear yellow joint effusion.  Hip was easily dislocated posteriorly.  It was misshapen and completely devoid of articular cartilage.  The femoral neck was then osteotomized using a calcar guide and removed from the wound.  There was abundant beefy red frond-like synovitis.  Synovectomy was performed from the glenoid.  The osteotomy was placed about 5 mm proximal to the lesser trochanter. A starter hole was then made through the piriformis fossa.  Reaming was performed to a 14.5 to accept a 15 component.  I had nice endosteal purchase.  Rasping was performed sequentially to 15 mm large stature. It had very nice fit.  I did not need a calcar cut.  Retractors were then placed about the acetabulum.  Further synovectomy was performed.  There was a large degenerative labrum that was also excised.  Retractor was placed about the acetabulum.  It was somewhat misshapen because of the superior migration of the femoral head. Reaming was performed sequentially to 59 mm to accept a 60-mm component. It had very nice bleeding circumferentially and a nice strong thick acetabulum.  I then trialed a 58 and 60-mm acetabular component.  It had complete seating of the 58, but not the 60 and good rim fit with a 58. Accordingly, the sector III metallic LCS acetabular component was impacted into the acetabulum.  It was a very nice fit.  It was nice and stable.  I did not need screws.  The trial polyethylene liner was inserted followed by the 15-mm large stature femoral rasp.  We trialed a number of neck lengths and felt like the +12 was the most stable.  At that point, there was minimal toggling and complete stability.  We actually had stability at the lower neck lengths, but there was a considerable toggling.  I felt that the leg lengths were too  short.  The trial components were then removed.  The joint was copiously irrigated with saline solution.  Apex hole eliminator was inserted into the acetabular component followed by the final Marathon polyethylene liner.  The large stature 15-mm AML femoral component was then impacted onto the calcar.  Wound was again irrigated.  We again trialed a +12 neck length and felt that it was perfectly stable.  The trial head was then removed.  We cleaned the [facility] taper neck and inserted the final 36-mm outer diameter hip ball with a +12 neck length. This was reduced, and through a full range of motion, it was perfectly stable and there was no subluxation.  There was no evidence of instability.  It had a very nice construct.  Wound was then irrigated with saline solution.  The capsule was closed anatomically with #1 Ethibond.  The short external rotators were closed with similar material.  The wound was again irrigated with saline solution.  The iliotibial band was closed with running #1 Vicryl, subcu was closed with 2-0 Vicryl and 3-0 Monocryl, skin was closed with skin clips.  Sterile bulky dressing was applied followed by a knee immobilizer.  The patient was then placed in the supine position, awoken, placed on the operating stretcher, and returned to the postanesthesia recovery room in satisfactory condition.     Claude Manges. Cleophas Dunker, M.D.     PWW/MEDQ  D:  01/01/2011  T:  01/01/2011  Job:  [id number]  Electronically Signed by Norlene Campbell M.D. on 01/24/2011 03:17:17 PM

## 2011-01-30 ENCOUNTER — Telehealth: Payer: Self-pay | Admitting: Internal Medicine

## 2011-01-30 MED ORDER — TEMAZEPAM 15 MG PO CAPS: ORAL_CAPSULE | ORAL | Status: DC

## 2011-01-30 NOTE — Telephone Encounter (Signed)
Ok to refill now plus 5 months. Please ask routine ov.

## 2011-01-30 NOTE — Telephone Encounter (Signed)
Last OV 08/23/2010.  Pending OV 02/25/2011.  Temazepam 15 mg, #60, 1-2 capsules at bedtime as needed filled on 12/28/2010. Please advise on sending refills.

## 2011-01-30 NOTE — Telephone Encounter (Signed)
Pt already has pending appt with [facility] for 02/25/11.  Called and spoke with pharmacist and gave verbal ok for refills.

## 2011-02-03 ENCOUNTER — Other Ambulatory Visit: Payer: Self-pay | Admitting: Cardiology

## 2011-02-04 NOTE — Discharge Summary (Signed)
NAMEANTHONE, PRIEUR                ACCOUNT NO.:  [id number]  MEDICAL RECORD NO.:  [id number]  LOCATION:  5029                         FACILITY:  [facility]  PHYSICIAN:  Claude Manges. , M.D.DATE OF BIRTH:  20-Feb-1946  DATE OF ADMISSION:  01/01/2011 DATE OF DISCHARGE:  01/04/2011                              DISCHARGE SUMMARY   ADMISSION DIAGNOSIS:  End-stage osteoarthritis of the right hip.  DISCHARGE DIAGNOSES: 1. End-stage osteoarthritis of the right hip. 2. Status post left total hip arthroplasty. 3. History of immunocompromisation and history of sepsis. 4. History of hypertension. 5. History of noninsulin-dependent diabetes mellitus. 6. History of chronic obstructive pulmonary disease. 7. History of hypothyroidism. 8. History of sleep apnea. 9. History of anemia. 10.Acute blood loss anemia.  PROCEDURE:  Right total hip arthroplasty.  HISTORY:  Mr. Gravlin is a 65 year old white male with longstanding problems with chronic snoring and hip pain.  It has worsened to where he has constant severe aching pain with intermittent sharp, stabbing pain on the right hip and groin.  Activity worsens his symptoms.  Rest improves his symptoms.  He is now having difficulty with his activities of daily living.  He has pain with every step.  Radiographic end-stage OA of the right hip was noted.  Indicated for right total hip arthroplasty.  HOSPITAL COURSE:  A 65 year old white male was admitted on January 01, 2011, after appropriate laboratory studies were obtained as well as Ancef 1 g IV on-call to the operating room as well as Ofirmev 1 g IV prior to induction of anesthesia.  He was taken to the operating room where he underwent a right total hip arthroplasty by Dr. Norlene Campbell and assisted by Jacqualine Code, PA-C.  This involved DePuy AML large stature 15-mm femoral component, 36 mm outer diameter hip ball with a +12 mm neck length, a 60-mm outer diameter sector II Porocoat  acetabular shell with an apex hole eliminator and a pedicle marathon polyethylene liner +4 with a 10-degree posterior lip.  All components were Press-Fit. He tolerated the procedure well.  He was started on Xarelto 10 mg p.o. daily on January 01, 2011, at 11 p.m.  He was placed on a Dilaudid PCA pump.  60 g carb-modified diet.  He was continued on Ancef 1 g IV q.6 h. x2 more doses.  He was placed on doxycycline 100 mg p.o. b.i.d.  Solu- Cortef 100 mg IV at 8 am and then restarting his prednisone dose in the morning.  Foley was placed intraoperatively.  Consult Physical Therapy for ambulation, weightbearing as tolerated.  A BMET and CBC was ordered in the PACU.  He is allowed out of bed to a chair the following day.  On the 25th, he did have some problems with hypertension.  Consultation was made with the hospitalist.  Decrease his IV to [facility] and started on hydralazine 10 mg IV now and every 4 hours p.r.n. systolic blood pressure greater than 180 mmHg.  Laboratory studies were obtained and TSH.  Diet was changed to a low sodium, carb modified.  He was continued on Ancef and doxycycline 100 mg p.o. b.i.d.  On his first postop he was weaned off his  PCA.  His fentanyl patch 100 mcg/hour q.3 days was renewed.  He was continued on Tylenol 1 g IV q.6-8 x4 more doses. Weaned off his O2 keeping his sats greater than 92%.  He was given 1 unit of packed cells over 3 hours on the 26th.  On the 27th, his IV fluid was stopped and he was saline locked.  Remainder of hospital course was uneventful and he was discharged on January 04, 2011, in improved condition, to return back to the office in followup.  LABORATORY STUDIES:  His discharge hemoglobin was 8.6, hematocrit 25.8%, white count 9300, platelets 183,000.  He did have 1 unit of packed cells during his hospital course.  His electrolytes at discharge with sodium 134, potassium 3.8, chloride 100, CO2 of 29, glucose 102, BUN 10, creatinine 0.54.   GFR remained greater than 60 throughout his hospital course.  His hemoglobin A1c was 6.3.  TSH was 0.655.  RADIOGRAPHIC STUDIES:  Right hip revealed right hip replacement without complicating features on January 01, 2011.  AP pelvis revealed the same night reading.  DISCHARGE INSTRUCTIONS:  He was placed on a low-salt, diabetic diet.  No lifting or driving for 6 weeks.  He may shower without dressing once there is no drainage.  Do not wash over the wound.  If drainage remains, cover the wound with plastic wrap and then shower.  He will be full weightbearing as taught in physical therapy.  Use walker and crutches as instructed.  He can use his stockings for 3 weeks on the right leg.  May remove at night time.  He is to follow back up on January 16, 2011, for staple removal.  He will follow the hip precautions as taught in physical therapy and may repeat changes dressing on Saturday, then change dressing daily with sterile 4 x 4 gauze, dressing, and paper tape.  He is to clean the incision with alcohol prior to redressing.  He was discharged in improved condition.     Oris Drone Petrarca, P.A.-C.   ______________________________ Claude Manges. Cleophas Dunker, M.D.    BDP/MEDQ  D:  01/29/2011  T:  01/30/2011  Job:  [id number]  Electronically Signed by Jacqualine Code P.A.-C. on 02/02/2011 11:46:54 PM Electronically Signed by Norlene Campbell M.D. on 02/04/2011 08:25:04 AM

## 2011-02-08 NOTE — Consult Note (Signed)
Johnny Patrick, Johnny Patrick                ACCOUNT NO.:  [id number]  MEDICAL RECORD NO.:  [id number]  LOCATION:  5029                         FACILITY:  [facility]  PHYSICIAN:  Sigfredo Schreier L. Lendell Caprice, MDDATE OF BIRTH:  [birth year]  DATE OF CONSULTATION:  01/01/2011 DATE OF DISCHARGE:                                CONSULTATION   REQUESTING PHYSICIAN:  Claude Manges. Cleophas Dunker, MD  REASON FOR CONSULTATION:  Elevated blood pressure  IMPRESSION/RECOMMENDATIONS: 1. Malignant hypertension:  The patient reports that his blood     pressure was 130/70 at his most recent visit with Dr. Daleen Squibb.  He     reports that his blood pressure tends to go up when he is "stressed     out."  Certainly, pain, salt load from saline, stress dose     steroids, or anxiety can be contributing.  I recommend decreasing     IV fluids to KVO to decrease salt load and change his diet to low     salt.  I will give hydralazine 10 mg IV q.4 h. p.r.n. systolic     blood pressure above 180.  If his blood pressure remains high, he     will need to have his home regimen adjusted.  Currently, he is on     losartan 100 mg a day and Coreg 25 mg b.i.d. 2. Diet-controlled diabetes. 3. Psoriatic arthritis. 4. Hypothyroidism:  I will also check a TSH. 5. Anemia.  HISTORY OF PRESENT ILLNESS:  Johnny Patrick is a pleasant 65 year old white male who was admitted after having a right total hip done by Dr. Cleophas Dunker.  His blood pressure has been 196-218 systolic and ranging 90- 102 diastolic.  He has no complaints of shortness of breath or chest pain.  He is complaining of some pain in his legs.  He has no nausea. We were consulted to assist with blood pressure management.  PAST MEDICAL HISTORY:  As above.  MEDICATIONS:  Currently, he is on: 1. Doxycycline 100 mg p.o. b.i.d. 2. Colace 100 mg p.o. b.i.d. 3. Solu-Cortef 100 mg IV perioperatively, but had been on prednisone 5     mg daily which will be resumed tomorrow. 4. Symbicort 160/4.5 mcg 1 puff  twice a day. 5. Cozaar 100 mg a day. 6. KCl 20 mEq a day. 7. Synthroid 150 mcg a day. 8. Neurontin 300 mg t.i.d. 9. Lasix 40 mg 2 tablets daily. 10.Folic acid 1 mg a day. 11.Fentanyl patch 100 mcg every 3 days. 12.Coreg 25 mg p.o. b.i.d. 13.Calcium 600 mg t.i.d. 14.Arava 20 mg daily. 15.Vitamin C 500 mg daily. 16.Ancef for 3 doses. 17.Xarelto 10 mg a day.  SOCIAL HISTORY:  The patient lives in [place].  He does not smoke, drink, or use drugs.  FAMILY HISTORY:  His brother died of an MI at age 59.  REVIEW OF SYSTEMS:  Systems reviewed and as above, otherwise negative.  PHYSICAL EXAMINATION:  VITAL SIGNS:  Temperature is 97.4, pulse 68, respiratory rate 19, blood pressure 196/92, and oxygen saturation 99% on 2 liters. GENERAL:  The patient is a well-nourished, well-developed, comfortable in no acute distress. HEENT:  Pupils are equal, round, and reactive to light.  Sclerae  are nonicteric.  Moist mucous membranes. NECK:  Supple.  No JVD.  No thyromegaly. LUNGS:  Clear to auscultation bilaterally without wheezes, rhonchi, or rales. CARDIOVASCULAR:  Regular rate and rhythm without murmurs, gallops, or rubs. ABDOMEN:  Normal bowel sounds, soft, nontender, nondistended. GU:  He has Foley catheter draining clear, yellow urine. EXTREMITIES:  Sequential compression devices and TED hose are in place. No edema.  Pulses are palpable. SKIN:  No rash. PSYCHIATRIC:  Normal affect. NEUROLOGIC:  Alert and oriented.  Cranial nerves and sensorimotor exam are grossly intact.  LABORATORY DATA:  CBC significant for a hemoglobin of 9.4, hematocrit 28.4.  Basic metabolic panel significant for a glucose of 137.  Hip x- ray today shows status post hip replacement, normal alignment.  Pelvic x- ray shows no complicating feature.  I would like to thank Dr. Cleophas Dunker for the opportunity to assist in the care of this very pleasant gentleman.  We will continue to follow. [facility] team will be available for any further questions.     Devlin Mcveigh L. Lendell Caprice, MD     CLS/MEDQ  D:  01/01/2011  T:  01/01/2011  Job:  [id number]  cc:   Thomas C. Daleen Squibb, MD, [facility]  Electronically Signed by Crista Curb MD on 02/08/2011 02:03:02 PM

## 2011-02-25 ENCOUNTER — Encounter: Payer: Self-pay | Admitting: Internal Medicine

## 2011-02-25 ENCOUNTER — Ambulatory Visit (INDEPENDENT_AMBULATORY_CARE_PROVIDER_SITE_OTHER): Payer: Medicare Other | Admitting: Internal Medicine

## 2011-02-25 VITALS — BP 112/60 | HR 86 | Ht 66.0 in | Wt 161.6 lb

## 2011-02-25 DIAGNOSIS — F5104 Psychophysiologic insomnia: Secondary | ICD-10-CM

## 2011-02-25 DIAGNOSIS — G4700 Insomnia, unspecified: Secondary | ICD-10-CM

## 2011-02-25 DIAGNOSIS — J449 Chronic obstructive pulmonary disease, unspecified: Secondary | ICD-10-CM

## 2011-02-25 DIAGNOSIS — J984 Other disorders of lung: Secondary | ICD-10-CM

## 2011-02-25 DIAGNOSIS — J4489 Other specified chronic obstructive pulmonary disease: Secondary | ICD-10-CM

## 2011-02-25 DIAGNOSIS — G4733 Obstructive sleep apnea (adult) (pediatric): Secondary | ICD-10-CM

## 2011-02-25 NOTE — Progress Notes (Signed)
Patient ID: Johnny Patrick, male    DOB: [birth year], 65 y.o.   MRN: [id number]  HPI 08/23/10-65 yoM former smoker followed for COPD with hx cryptococcosis, OSA, allergic rhinitis, hx lung nodule, GERD, psoriatic arthritis, degenerative disk disease Last here February 20, 2010 after rods removed from spine. Continues f/u for anemia of chronic disease.  Since last here chronic cough has largely resolved.  Breathing now feels normal w/o cough, wheeze or shortness of breath. He does tire easily and is unsteady with rods out, so uses cart to ride at store. Discussed last CXR 6/ 2011  02/24/11- 65 yoM former smoker followed for COPD with hx cryptococcosis, OSA, allergic rhinitis, hx lung nodule, GERD, psoriatic arthritis, degenerative disk disease He has had flu vaccine. He reports being comfortable with CPAP at [facility] and able to use it all night every night with good control. Since last here he had right hip replacement with no problems on September 25. He describes nonspecific pains across his low back and in the low back muscles which do not affect his breathing. He denies lung or respiratory discomfort and had no respiratory problems with his hip surgery. He was seen by Dr. [name] for infectious disease because of left arm nodules/cryptococcosis. Chest x-ray 08/23/2010-no active cardiopulmonary disease. Removal of thoracic rods, mild compression fractures and kyphosis. He had used his rescue inhaler just before coming today. He has continued to use temazepam for insomnia. Ambien used in hospital worked about the same.    Review of Systems-see HPI Constitutional:   No-   weight loss, night sweats, fevers, chills, fatigue, lassitude. HEENT:   No-  headaches, difficulty swallowing, tooth/dental problems, sore throat,       No-  sneezing, itching, ear ache, nasal congestion, post nasal drip,  CV:  No-   chest pain, orthopnea, PND, swelling in lower extremities, anasarca,                                   dizziness, palpitations Resp: No-   shortness of breath with exertion or at rest.              No-   productive cough,  No non-productive cough,  No- coughing up of blood.              No-   change in color of mucus.  No- wheezing.   Skin: No-   rash or lesions. GI:  No-   heartburn, indigestion, abdominal pain, nausea, vomiting, diarrhea,                 change in bowel habits, loss of appetite GU: No-   dysuria, change in color of urine, no urgency or frequency.  No- flank pain. MS:  No-significant change in   joint pain or swelling, decreased range of motion.  +- back pain. Neuro-     nothing unusual Psych:  No- change in mood or affect. No depression or anxiety.  No memory loss.      Objective:   Physical Exam General- Alert, Oriented, Affect-appropriate, Distress- none acute Skin- rash-none, lesions- none, excoriation- none Lymphadenopathy- none Head- atraumatic            Eyes- Gross vision intact, PERRLA, conjunctivae clear secretions            Ears- Hearing, canals-normal            Nose- Clear, no-Septal dev,  mucus, polyps, erosion, perforation             Throat- Mallampati II, mucosa clear, + small amount of yellow post-nasal drip, tonsils- atrophic Neck- flexible , trachea midline, no stridor , thyroid nl, carotid no bruit Chest - symmetrical excursion , unlabored           Heart/CV- RRR , no murmur , no gallop  , no rub, nl s1 s2                           - JVD- none , edema- none, stasis changes- none, varices- none           Lung- sounds are unlabored, coarse but clear., wheeze- none, cough- none , dullness-none, rub- none           Chest wall-  Abd- tender-no, distended-no, bowel sounds-present, HSM- no Br/ Gen/ Rectal- Not done, not indicated Extrem- cyanosis- none, clubbing, none, atrophy- none, strength-not assessed Neuro- grossly intact to observation

## 2011-02-25 NOTE — Patient Instructions (Signed)
Please call as needed 

## 2011-02-27 DIAGNOSIS — F5104 Psychophysiologic insomnia: Secondary | ICD-10-CM | POA: Insufficient documentation

## 2011-02-27 NOTE — Assessment & Plan Note (Signed)
He continues with good compliance and control.

## 2011-02-27 NOTE — Assessment & Plan Note (Signed)
He found Ambien to be equivalent to temazepam, so will continue the latter. Biggest factors have been somatic pains and limited ability to exercise.

## 2011-02-27 NOTE — Assessment & Plan Note (Signed)
He feels he is well controlled at the current visit.

## 2011-04-22 ENCOUNTER — Telehealth: Payer: Self-pay | Admitting: Infectious Disease

## 2011-04-22 NOTE — Telephone Encounter (Signed)
Received call from Retta Mac re Johnny Patrick. Apparently he had swelling in his ankle which was tapped and yielded bloody fluid. NO crystals, WBC seen on gram stain but no organisms and no growth. No cell count performed but diff was 95% PMNS. He is on doxycyline and keflex. [place] not sure if pt was on abx prior to tap. [place] nervous about pt given his immunocompromised status and hx of recurrent infection.   Asher Muir can you see where we might have space this week for Johnny Patrick. Dr [name] has seen him in [facility] as an outpt but i believe most in our group have seen him. I stil have openings on Friday correct?

## 2011-04-23 DIAGNOSIS — M19079 Primary osteoarthritis, unspecified ankle and foot: Secondary | ICD-10-CM | POA: Diagnosis not present

## 2011-04-24 ENCOUNTER — Encounter: Payer: Self-pay | Admitting: Infectious Disease

## 2011-04-24 ENCOUNTER — Ambulatory Visit (INDEPENDENT_AMBULATORY_CARE_PROVIDER_SITE_OTHER): Payer: Medicare Other | Admitting: Infectious Disease

## 2011-04-24 ENCOUNTER — Encounter (HOSPITAL_COMMUNITY): Payer: Self-pay | Admitting: Internal Medicine

## 2011-04-24 ENCOUNTER — Inpatient Hospital Stay (HOSPITAL_COMMUNITY)
Admission: AD | Admit: 2011-04-24 | Discharge: 2011-04-26 | DRG: 603 | Disposition: A | Discharge status: Home Health | Payer: Medicare Other | Source: Ambulatory Visit | Attending: Internal Medicine | Admitting: Internal Medicine

## 2011-04-24 ENCOUNTER — Ambulatory Visit (HOSPITAL_COMMUNITY)
Admission: RE | Admit: 2011-04-24 | Discharge: 2011-04-24 | Disposition: A | Discharge status: Home / Self Care | Payer: Medicare Other | Source: Ambulatory Visit | Attending: Infectious Disease | Admitting: Infectious Disease

## 2011-04-24 VITALS — BP 122/70 | HR 87 | Temp 98.2°F | Wt 166.0 lb

## 2011-04-24 DIAGNOSIS — M545 Low back pain, unspecified: Secondary | ICD-10-CM | POA: Diagnosis present

## 2011-04-24 DIAGNOSIS — L02419 Cutaneous abscess of limb, unspecified: Principal | ICD-10-CM | POA: Diagnosis present

## 2011-04-24 DIAGNOSIS — R609 Edema, unspecified: Secondary | ICD-10-CM | POA: Diagnosis not present

## 2011-04-24 DIAGNOSIS — Z96649 Presence of unspecified artificial hip joint: Secondary | ICD-10-CM

## 2011-04-24 DIAGNOSIS — L03119 Cellulitis of unspecified part of limb: Secondary | ICD-10-CM

## 2011-04-24 DIAGNOSIS — G4733 Obstructive sleep apnea (adult) (pediatric): Secondary | ICD-10-CM | POA: Diagnosis present

## 2011-04-24 DIAGNOSIS — M7989 Other specified soft tissue disorders: Secondary | ICD-10-CM

## 2011-04-24 DIAGNOSIS — K219 Gastro-esophageal reflux disease without esophagitis: Secondary | ICD-10-CM | POA: Diagnosis present

## 2011-04-24 DIAGNOSIS — I10 Essential (primary) hypertension: Secondary | ICD-10-CM

## 2011-04-24 DIAGNOSIS — R600 Localized edema: Secondary | ICD-10-CM

## 2011-04-24 DIAGNOSIS — J449 Chronic obstructive pulmonary disease, unspecified: Secondary | ICD-10-CM | POA: Diagnosis present

## 2011-04-24 DIAGNOSIS — D849 Immunodeficiency, unspecified: Secondary | ICD-10-CM | POA: Diagnosis present

## 2011-04-24 DIAGNOSIS — J4489 Other specified chronic obstructive pulmonary disease: Secondary | ICD-10-CM | POA: Diagnosis present

## 2011-04-24 DIAGNOSIS — E039 Hypothyroidism, unspecified: Secondary | ICD-10-CM | POA: Diagnosis present

## 2011-04-24 DIAGNOSIS — L4050 Arthropathic psoriasis, unspecified: Secondary | ICD-10-CM | POA: Diagnosis present

## 2011-04-24 HISTORY — DX: Methicillin resistant Staphylococcus aureus infection, unspecified site: A49.02

## 2011-04-24 HISTORY — DX: Sleep apnea, unspecified: G47.30

## 2011-04-24 LAB — CBC
HCT: 30.5 % — ABNORMAL LOW (ref 39.0–52.0)
MCHC: 32.1 g/dL (ref 30.0–36.0)
Platelets: 393 10*3/uL (ref 150–400)
RDW: 16.2 % — ABNORMAL HIGH (ref 11.5–15.5)

## 2011-04-24 LAB — DIFFERENTIAL
Eosinophils Absolute: 0.1 10*3/uL (ref 0.0–0.7)
Lymphs Abs: 1.5 10*3/uL (ref 0.7–4.0)
Monocytes Relative: 7 % (ref 3–12)
Neutro Abs: 5.2 10*3/uL (ref 1.7–7.7)
Neutrophils Relative %: 71 % (ref 43–77)

## 2011-04-24 LAB — COMPREHENSIVE METABOLIC PANEL
Albumin: 2.7 g/dL — ABNORMAL LOW (ref 3.5–5.2)
Alkaline Phosphatase: 239 U/L — ABNORMAL HIGH (ref 39–117)
BUN: 17 mg/dL (ref 6–23)
Potassium: 4.3 mEq/L (ref 3.5–5.1)
Total Protein: 6.5 g/dL (ref 6.0–8.3)

## 2011-04-24 MED ORDER — VITAMIN C 500 MG PO TABS
500.0000 mg | ORAL_TABLET | Freq: Every day | ORAL | Status: DC
  Administered 2011-04-25 – 2011-04-26 (×2): 500 mg via ORAL
  Filled 2011-04-24 (×3): qty 1

## 2011-04-24 MED ORDER — VANCOMYCIN HCL IN DEXTROSE 1-5 GM/200ML-% IV SOLN
1000.0000 mg | Freq: Once | INTRAVENOUS | Status: AC
  Administered 2011-04-24: 1000 mg via INTRAVENOUS
  Filled 2011-04-24: qty 200

## 2011-04-24 MED ORDER — ACETAMINOPHEN 650 MG RE SUPP: 650.0000 mg | Freq: Four times a day (QID) | RECTAL | Status: DC | PRN

## 2011-04-24 MED ORDER — ACETAMINOPHEN 325 MG PO TABS: 650.0000 mg | ORAL_TABLET | Freq: Four times a day (QID) | ORAL | Status: DC | PRN

## 2011-04-24 MED ORDER — VANCOMYCIN HCL IN DEXTROSE 1-5 GM/200ML-% IV SOLN
1000.0000 mg | Freq: Two times a day (BID) | INTRAVENOUS | Status: DC
  Administered 2011-04-25 – 2011-04-26 (×3): 1000 mg via INTRAVENOUS
  Filled 2011-04-24 (×5): qty 200

## 2011-04-24 MED ORDER — ADULT MULTIVITAMIN W/MINERALS CH
1.0000 | ORAL_TABLET | Freq: Every day | ORAL | Status: DC
  Administered 2011-04-25 – 2011-04-26 (×2): 1 via ORAL
  Filled 2011-04-24 (×3): qty 1

## 2011-04-24 MED ORDER — FENTANYL 100 MCG/HR TD PT72
100.0000 ug | MEDICATED_PATCH | TRANSDERMAL | Status: DC
  Administered 2011-04-26: 100 ug via TRANSDERMAL
  Filled 2011-04-24: qty 1

## 2011-04-24 MED ORDER — CYCLOBENZAPRINE HCL 10 MG PO TABS
10.0000 mg | ORAL_TABLET | Freq: Three times a day (TID) | ORAL | Status: DC | PRN
  Administered 2011-04-24 – 2011-04-25 (×3): 10 mg via ORAL
  Filled 2011-04-24 (×3): qty 1

## 2011-04-24 MED ORDER — ONDANSETRON HCL 4 MG/2ML IJ SOLN: 4.0000 mg | Freq: Four times a day (QID) | INTRAMUSCULAR | Status: DC | PRN

## 2011-04-24 MED ORDER — BUDESONIDE-FORMOTEROL FUMARATE 160-4.5 MCG/ACT IN AERO
2.0000 | INHALATION_SPRAY | Freq: Two times a day (BID) | RESPIRATORY_TRACT | Status: DC
  Administered 2011-04-24 – 2011-04-26 (×4): 2 via RESPIRATORY_TRACT
  Filled 2011-04-24: qty 6

## 2011-04-24 MED ORDER — LEFLUNOMIDE 20 MG PO TABS
20.0000 mg | ORAL_TABLET | Freq: Every day | ORAL | Status: DC
  Administered 2011-04-25 – 2011-04-26 (×2): 20 mg via ORAL
  Filled 2011-04-24 (×2): qty 1

## 2011-04-24 MED ORDER — ENOXAPARIN SODIUM 40 MG/0.4ML ~~LOC~~ SOLN
40.0000 mg | SUBCUTANEOUS | Status: DC
  Administered 2011-04-24 – 2011-04-25 (×2): 40 mg via SUBCUTANEOUS
  Filled 2011-04-24 (×4): qty 0.4

## 2011-04-24 MED ORDER — ONDANSETRON HCL 4 MG PO TABS: 4.0000 mg | ORAL_TABLET | Freq: Four times a day (QID) | ORAL | Status: DC | PRN

## 2011-04-24 MED ORDER — OXYCODONE-ACETAMINOPHEN 5-325 MG PO TABS
1.0000 | ORAL_TABLET | ORAL | Status: DC | PRN
  Administered 2011-04-24 – 2011-04-26 (×4): 1 via ORAL
  Filled 2011-04-24 (×5): qty 1

## 2011-04-24 MED ORDER — CALCIUM CARBONATE 600 MG PO TABS: 600.0000 mg | ORAL_TABLET | Freq: Three times a day (TID) | ORAL | Status: DC

## 2011-04-24 MED ORDER — GABAPENTIN 300 MG PO CAPS
300.0000 mg | ORAL_CAPSULE | Freq: Three times a day (TID) | ORAL | Status: DC
  Administered 2011-04-24 – 2011-04-26 (×6): 300 mg via ORAL
  Filled 2011-04-24 (×12): qty 1

## 2011-04-24 MED ORDER — CARVEDILOL 25 MG PO TABS
25.0000 mg | ORAL_TABLET | Freq: Two times a day (BID) | ORAL | Status: DC
  Administered 2011-04-24 – 2011-04-26 (×5): 25 mg via ORAL
  Filled 2011-04-24 (×8): qty 1

## 2011-04-24 MED ORDER — SODIUM CHLORIDE 0.45 % IV SOLN
INTRAVENOUS | Status: DC
  Administered 2011-04-24: 18:00:00 via INTRAVENOUS

## 2011-04-24 MED ORDER — LEVOTHYROXINE SODIUM 150 MCG PO TABS
150.0000 ug | ORAL_TABLET | Freq: Every day | ORAL | Status: DC
  Administered 2011-04-25 – 2011-04-26 (×2): 150 ug via ORAL
  Filled 2011-04-24 (×4): qty 1

## 2011-04-24 MED ORDER — FOLIC ACID 1 MG PO TABS
1.0000 mg | ORAL_TABLET | Freq: Every day | ORAL | Status: DC
Start: 2011-04-25 — End: 2011-04-26
  Administered 2011-04-25 – 2011-04-26 (×2): 1 mg via ORAL
  Filled 2011-04-24 (×3): qty 1

## 2011-04-24 MED ORDER — CALCIUM CARBONATE 1250 (500 CA) MG PO TABS
1.0000 | ORAL_TABLET | Freq: Three times a day (TID) | ORAL | Status: DC
  Administered 2011-04-24 – 2011-04-26 (×5): 500 mg via ORAL
  Filled 2011-04-24 (×10): qty 1

## 2011-04-24 MED ORDER — HYDROMORPHONE HCL PF 1 MG/ML IJ SOLN
1.0000 mg | INTRAMUSCULAR | Status: DC | PRN
  Administered 2011-04-24 – 2011-04-26 (×12): 1 mg via INTRAVENOUS
  Filled 2011-04-24 (×11): qty 1

## 2011-04-24 MED ORDER — TEMAZEPAM 15 MG PO CAPS
15.0000 mg | ORAL_CAPSULE | Freq: Every evening | ORAL | Status: DC | PRN
  Administered 2011-04-24 – 2011-04-25 (×2): 30 mg via ORAL
  Filled 2011-04-24 (×2): qty 2

## 2011-04-24 MED ORDER — PREDNISONE 5 MG PO TABS
5.0000 mg | ORAL_TABLET | Freq: Every day | ORAL | Status: DC
  Administered 2011-04-25 – 2011-04-26 (×2): 5 mg via ORAL
  Filled 2011-04-24 (×3): qty 1

## 2011-04-24 MED ORDER — LOSARTAN POTASSIUM 50 MG PO TABS
100.0000 mg | ORAL_TABLET | Freq: Every day | ORAL | Status: DC
  Administered 2011-04-25 – 2011-04-26 (×2): 100 mg via ORAL
  Filled 2011-04-24 (×3): qty 2

## 2011-04-24 NOTE — Assessment & Plan Note (Signed)
See above plan. I would rx him with vancomycin for coverage of MSSA, and MRSA. I would elevate leg. I would NOT jump to gram negative coverage until he has failed vanco and elevation of leg. WIll rule out dvt

## 2011-04-24 NOTE — Progress Notes (Addendum)
ANTIBIOTIC CONSULT NOTE - INITIAL  Pharmacy Consult for Vancomycin Indication: cellulitis  Allergies  Allergen Reactions  . Augmentin   . Sulfur Rash    Patient Measurements: Height: 5\' 6"  (167.6 cm) Weight: 161 lb 4.8 oz (73.165 kg) IBW/kg (Calculated) : 63.8    Vital Signs: Temp: 99.2 F (37.3 C) (01/16 1600) Temp src: Oral (01/16 1600) BP: 170/90 mmHg (01/16 1600) Pulse Rate: 76  (01/16 1600) Intake/Output from previous day:   Intake/Output from this shift:    Labs: No results found for this basename: WBC:3,HGB:3,PLT:3,LABCREA:3,CREATININE:3 in the last 72 hours Estimated Creatinine Clearance: 79.8 ml/min (by C-G formula based on Cr of 0.54). No results found for this basename: VANCOTROUGH:2,VANCOPEAK:2,VANCORANDOM:2,GENTTROUGH:2,GENTPEAK:2,GENTRANDOM:2,TOBRATROUGH:2,TOBRAPEAK:2,TOBRARND:2,AMIKACINPEAK:2,AMIKACINTROU:2,AMIKACIN:2, in the last 72 hours   Microbiology: No results found for this or any previous visit (from the past 720 hour(s)).  Medical History: Past Medical History  Diagnosis Date  . Chronic airway obstruction, not elsewhere classified   . Esophageal reflux   . Unspecified essential hypertension   . Anemia   . Hypothyroidism   . Osteoarthritis   . Nephrolithiasis   . Psoriatic arthritis   . Low back pain   . 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 MSSA (methicillin susceptible Staphylococcus aureus) septicemia     L 2nd toe-amputated June 2011  . Shingles     x2  . MRSA (methicillin resistant Staphylococcus aureus)   . Sleep apnea     Medications:  Scheduled:    . budesonide-formoterol  2 puff Inhalation BID  . calcium carbonate  600 mg Oral TID  . carvedilol  25 mg Oral BID WC  . enoxaparin  30 mg Subcutaneous Q24H  . fentaNYL  100 mcg Transdermal Q3 days  . folic acid  1 mg Oral Daily  . gabapentin  300 mg Oral Q8H  . leflunomide  20 mg Oral Daily  . levothyroxine  150 mcg Oral Daily  . losartan  100 mg Oral Daily  . mulitivitamin  with minerals  1 tablet Oral Daily  . predniSONE  5 mg Oral Daily  . Vitamin C  500 mg Oral Daily   Assessment: 66 yo male with multiple infections in past per ID note to start Vancomycin IV per pharmacy dosing. Do not yet know SCr, so will give a one time dose of Vancomycin and re-evaluate after renal function is known  Goal of Therapy:  Vancomycin trough level 15-20 mcg/ml  Plan:  1. Vancomycin 1g x 1 2. Await SCr result before deciding on further dosing 3. Going with goal trough 15-20 due to hx other infections  Hessie Knows, PharmD, BCPS 04/24/2011 4:59 PM [phone]     Addendum:   Labs: Scr 0.77, CG CrCl 80 ml/min  A/P: With known CrCl now, will start Vancomycin 1g IV q12 per protocol and check a trough at steady state prn  Hessie Knows, PharmD, BCPS 04/24/2011 8:10 PM [phone]

## 2011-04-24 NOTE — Progress Notes (Signed)
VASCULAR LAB PRELIMINARY  PRELIMINARY  PRELIMINARY  PRELIMINARY  Left lower extremity venous duplex completed.    Preliminary report:  Left:  No evidence of DVT, superficial thrombosis, or Baker's cyst.  Interstitial fluid noted at ankle.  Terance Hart, RVT 04/24/2011, 4:58 PM

## 2011-04-24 NOTE — Assessment & Plan Note (Signed)
Will get doppler to rule out DVT. If he had DVT we will clearly deal with this and have him admitted adn plced on anticoaguation. Otherwise if he has no DVT then best explanation is cellulitis given the MRI findings. Given that he has failed outpt oral antibiotics I think it would be reasonable to admit him to the hospital and place him on IV vancomycin with elevation of his leg (this latter manipulation may be the most important maneuver that needs to be done> We discussed various options and he and wife were in agreement with brief stay in hospital to get IV antibiotics on board and ensure leg aggressively elevated. If he did well on vanco alone would change back to oral doxycline and oral keflex and finish course with these antibiotics.

## 2011-04-24 NOTE — Progress Notes (Signed)
Subjective:    Patient ID: Johnny Patrick, male    DOB: 11-24-1945, 66 y.o.   MRN: [id number]  HPI  66 year old Caucasian male with complicated PMHS including prior cryptococcal infection of arm,  psoriatic arthritis (prev on methotrexate and prednisone), prev L THR (02-2008), prev R foot MSSA infection with sepsis 2009. He subsequently developed infection in his L second toe (MRI no osteo) And her underwent amputation 09-08-09. Cx's MSSA.  He was then seen Feb 2012 with cellulits of his R hand after a presumed cut to his hand. He completed a course of doxy for this. MRI showed no osteo, abscess or myofasciitis. By 3-13 he had worsening of swelling, erythema in his hand. He was rec to go to the ED which he refused. He got another 20 days of doxy after this then 10 days of bactrim (completed 07-02-10). Was seen in ID clinic 07-11-10 and was started on 30 more days of doxy (he was given choice of PIC or PO). He was seen again in ID clinic May 2012 and was to be continued on doxy to August 2012. (he was seen by Dr. [name] multiple times along this course). 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 ARe became edematous, and exquisitely tender to palpation. He began taking his home doxycycline. He was then seen by Norlene Campbell who saw him on Friday last week and tapped a fluid collection which showed no crystals multiple wbc predominantly PMNS >90%. Cell count was not done. He was switched to doxy and keflex and continued this thru Monday at which time was re-evaluated by Dr. Cleophas Dunker with no improvement and changed to levaquin and doxycyline. He had MRI done last night [facility] per Dr. Jena Gauss showed no abscess, no osteomyelitis, only minimal edema in tibia that he feels is c/w arthritis but not septic arthritis. He has ESR, CRP and uric acid pending in [facility] as well. He was to see Dr. Cleophas Dunker today at 245pm today. I discussed this case with the patient  and his wife. I think that he would be well served by admission today to expedite doppler to rule out DVT, to place the pt on IV vancomycin and make sure his leg is elevated. I spent greater than 45 minutes with the patient including greater than 50% of time in face to face counsel of the patient and in coordination of their care. I will call Dr. Cleophas Dunker office to cancel appt there while we admit pt to triad at [facility]. PCP is Dr. Nicholos Johns.    Review of Systems  Constitutional: Negative for fever, chills, diaphoresis, activity change, appetite change, fatigue and unexpected weight change.  HENT: Negative for congestion, sore throat, rhinorrhea, sneezing, trouble swallowing and sinus pressure.   Eyes: Negative for photophobia and visual disturbance.  Respiratory: Negative for cough, chest tightness, shortness of breath, wheezing and stridor.   Cardiovascular: Negative for chest pain, palpitations and leg swelling.  Gastrointestinal: Negative for nausea, vomiting, abdominal pain, diarrhea, constipation, blood in stool, abdominal distention and anal bleeding.  Genitourinary: Negative for dysuria, hematuria, flank pain and difficulty urinating.  Musculoskeletal: Positive for myalgias and joint swelling. Negative for back pain, arthralgias and gait problem.  Skin: Positive for color change and rash. Negative for pallor and wound.  Neurological: Negative for dizziness, tremors, weakness and light-headedness.  Hematological: Negative for adenopathy. Does not bruise/bleed easily.  Psychiatric/Behavioral: Negative for behavioral problems, confusion, sleep disturbance, dysphoric mood,  decreased concentration and agitation.       Objective:   Physical Exam  Constitutional: He is oriented to person, place, and time. He appears well-developed and well-nourished. No distress.  HENT:  Head: Normocephalic and atraumatic.  Mouth/Throat: Oropharynx is clear and moist. No oropharyngeal exudate.    Eyes: Conjunctivae and EOM are normal. Pupils are equal, round, and reactive to light. No scleral icterus.  Neck: Normal range of motion. Neck supple. No JVD present.  Cardiovascular: Normal rate and regular rhythm.  Exam reveals no gallop and no friction rub.   Murmur heard. Pulmonary/Chest: Effort normal and breath sounds normal. No respiratory distress. He has no wheezes. He has no rales. He exhibits no tenderness.  Abdominal: He exhibits no distension and no mass. There is no tenderness. There is no rebound and no guarding.  Musculoskeletal: He exhibits no edema and no tenderness.       Arms:      Feet:  Lymphadenopathy:    He has no cervical adenopathy.  Neurological: He is alert and oriented to person, place, and time. He has normal reflexes. He exhibits normal muscle tone. Coordination normal.  Skin: Skin is warm and dry. He is not diaphoretic. No erythema. No pallor.  Psychiatric: He has a normal mood and affect. His behavior is normal. Judgment and thought content normal.          Assessment & Plan:  Leg edema Will get doppler to rule out DVT. If he had DVT we will clearly deal with this and have him admitted adn plced on anticoaguation. Otherwise if he has no DVT then best explanation is cellulitis given the MRI findings. Given that he has failed outpt oral antibiotics I think it would be reasonable to admit him to the hospital and place him on IV vancomycin with elevation of his leg (this latter manipulation may be the most important maneuver that needs to be done> We discussed various options and he and wife were in agreement with brief stay in hospital to get IV antibiotics on board and ensure leg aggressively elevated. If he did well on vanco alone would change back to oral doxycline and oral keflex and finish course with these antibiotics.   Cellulitis, leg See above plan. I would rx him with vancomycin for coverage of MSSA, and MRSA. I would elevate leg. I would NOT jump  to gram negative coverage until he has failed vanco and elevation of leg. WIll rule out dvt

## 2011-04-24 NOTE — H&P (Addendum)
PCP:   Georgianne Fick, MD, MD   Chief Complaint:  Swelling and redness of left ankle  HPI: Johnny Patrick is a 66 year old Caucasian male with history of psoriatic arthritis on immunosuppressants and multiple joint infections in the past, completed a prolonged course of doxycycline in the summer of 2012 for her cellulitis of his right hand which has since healed, developed pain and swelling of his left ankle about 10 days ago, this was associated with erythema he subsequently started taking his doxycycline that he had at home he was been seen by Dr. Norlene Campbell his orthopedic surgeon on Thursday who aspirated his left knee where the aspirate but predominantly showed multiple WBCs with PMNs greater than 90% and no crystals, he was started on Keflex in addition to his doxycycline and on Monday his Keflex was changed to Levaquin upon reevaluation by Dr. Cleophas Dunker, he also had an MRI of his left ankle but apparently showed no abscess or osteomyelitis we do not have these records in our system yet , today he went to see Dr. Daiva Eves and in in the infectious disease clinic who recommended admission.  Allergies:   Allergies  Allergen Reactions  . Augmentin   . Sulfur Rash      Past Medical History  Diagnosis Date  . Chronic airway obstruction, not elsewhere classified   . Esophageal reflux   . Unspecified essential hypertension   . Anemia   . Hypothyroidism   . Osteoarthritis   . Nephrolithiasis   . Psoriatic arthritis   . Low back pain   . 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 MSSA (methicillin susceptible Staphylococcus aureus) septicemia     L 2nd toe-amputated June 2011  . Shingles     x2  . MRSA (methicillin resistant Staphylococcus aureus)   . Sleep apnea    diabetes controlled off medications  Past Surgical History  Procedure Date  . Total hip arthroplasty     bilateral  . Thoracic spine surgery   . Toe amputation   . Back surgery     3 fusions, 4th - undo 3rd fusion  .  Toe surgery 2009    little toe on right foot , bone removed    Prior to Admission medications   Medication Sig Start Date End Date Taking? Authorizing Provider  Ascorbic Acid (VITAMIN C) 500 MG CHEW Chew 500 mg by mouth daily.     Yes Historical Provider, MD  budesonide-formoterol (SYMBICORT) 160-4.5 MCG/ACT inhaler Inhale 2 puffs into the lungs 2 (two) times daily. 11/23/10  Yes Waymon Budge, MD  calcium carbonate (CALCIUM 600) 600 MG TABS Take 600 mg by mouth 3 (three) times daily.     Yes Historical Provider, MD  carvedilol (COREG) 25 MG tablet TAKE ONE TABLET TWICE DAILY 02/03/11  Yes Valera Castle, MD  Cholecalciferol 400 UNITS CAPS Take 1 capsule by mouth daily.    Yes Historical Provider, MD  cyclobenzaprine (FLEXERIL) 10 MG tablet Take 10 mg by mouth every 8 (eight) hours as needed. For muscle pain.   Yes Historical Provider, MD  doxycycline (VIBRA-TABS) 100 MG tablet Take 100 mg by mouth 2 (two) times daily. Course of therapy not completed; started last week.   Yes Historical Provider, MD  fentaNYL (DURAGESIC - DOSED MCG/HR) 100 MCG/HR Place 1 patch onto the skin every 3 (three) days.     Yes Historical Provider, MD  folic acid (FOLVITE) 1 MG tablet Take 1 mg by mouth daily.  Yes Historical Provider, MD  furosemide (LASIX) 40 MG tablet Take 80 mg by mouth daily.    Yes Historical Provider, MD  gabapentin (NEURONTIN) 300 MG capsule Take 300 mg by mouth every 8 (eight) hours.     Yes Historical Provider, MD  HYDROmorphone (DILAUDID) 2 MG tablet Take 2 mg by mouth every 4 (four) hours.     Yes Historical Provider, MD  leflunomide (ARAVA) 20 MG tablet Take 20 mg by mouth daily.     Yes Historical Provider, MD  levofloxacin (LEVAQUIN) 500 MG tablet Take 500 mg by mouth daily. 10 day course of therapy; not completed.  Started 04/22/11   Yes Historical Provider, MD  levothyroxine (SYNTHROID, LEVOTHROID) 150 MCG tablet Take 150 mcg by mouth daily.     Yes Historical Provider, MD  losartan  (COZAAR) 100 MG tablet Take 1 tablet (100 mg total) by mouth daily. 01/07/11  Yes Valera Castle, MD  Multiple Vitamin (MULITIVITAMIN WITH MINERALS) TABS Take 1 tablet by mouth daily.   Yes Historical Provider, MD  Omega-3 Fatty Acids (FISH OIL) 1000 MG CAPS Take 1 capsule by mouth 3 (three) times daily.    Yes Historical Provider, MD  oxyCODONE-acetaminophen (PERCOCET) 5-325 MG per tablet Take 1 tablet by mouth every 4 (four) hours as needed. For pain.   Yes Historical Provider, MD  potassium chloride SA (KLOR-CON M20) 20 MEQ tablet Take 20 mEq by mouth daily.    Yes Historical Provider, MD  predniSONE (DELTASONE) 5 MG tablet Take 5 mg by mouth daily.     Yes Historical Provider, MD  temazepam (RESTORIL) 15 MG capsule Take 15-30 mg by mouth at bedtime as needed. For sleep.   Yes Historical Provider, MD    Social History:  reports that he quit smoking about 20 years ago. Denies alcohol use, lives at home with his wife is retired.    Family History  Problem Relation Age of Onset  . Coronary artery disease Mother   . Pneumonia Mother   . Heart failure Mother   . Breast cancer Mother   . Psoriasis Father   . Coronary artery disease Brother     Review of Systems:  Constitutional: Denies fever, chills, diaphoresis, appetite change and fatigue.  HEENT: Denies photophobia, eye pain, redness, hearing loss, ear pain, congestion, sore throat, rhinorrhea, sneezing, mouth sores, trouble swallowing, neck pain, neck stiffness and tinnitus.   Respiratory: Denies SOB, DOE, cough, chest tightness,  and wheezing.   Cardiovascular: Denies chest pain, palpitations and leg swelling.  Gastrointestinal: Denies nausea, vomiting, abdominal pain, diarrhea, constipation, blood in stool and abdominal distention.  Genitourinary: Denies dysuria, urgency, frequency, hematuria, flank pain and difficulty urinating.  Skin: Denies pallor, rash and wound.  Neurological: Denies dizziness, seizures, syncope, weakness,  light-headedness, numbness and headaches.  Hematological: Denies adenopathy. Easy bruising, personal or family bleeding history  Psychiatric/Behavioral: Denies suicidal ideation, mood changes, confusion, nervousness, sleep disturbance and agitation   Physical Exam: Blood pressure 170/90, pulse 76, temperature 99.2 F (37.3 C), temperature source Oral, resp. rate 18, height 5\' 6"  (1.676 m), weight 73.165 kg (161 lb 4.8 oz), SpO2 96.00%. General exam alert awake oriented x3 HEENT: Oral mucosa moist and pink neck no JVD or lymphadenopathy CVS S1-S2 regular rate rhythm no murmurs rubs or gallops Lungs are clear to auscultation bilaterally Abdomen soft nontender with normal bowel sounds organomegaly Extremities left lower extremity with erythema and edema and tenderness involving most of the lower leg and foot, status post amputation of the  second toe,small scab on the dorsum of the left foot, peripheral pulses diminished and difficult to appreciate, good capillary refill, sensation is intact   Labs on Admission:  No results found for this or any previous visit (from the past 48 hour(s)).  Radiological Exams on Admission: No results found.  Assessment/Plan 1. Cellulitis of left leg Start IV vancomycin, elevation of the left leg, pain control Dopplers done negative for DVT Check ESR and CRP Get records of MRI of left ankle done yesterday, followup culture results from attempted ankle aspirate 2.  Immunosuppression: Continue low-dose prednisone and leflunomide, currently not exhibiting any signs symptoms of adrenal insufficiency 3. history of psoriatic arthritis: Continue home medications as above and home dose of narcotics 4. COPD stable 5. DVT prophylaxis with Lovenox 6. CODE STATUS full code  Time Spent on Admission:  Johnny Patrick [facility] Pager: [phone] 04/24/2011, 4:51 PM

## 2011-04-25 DIAGNOSIS — L0390 Cellulitis, unspecified: Secondary | ICD-10-CM

## 2011-04-25 DIAGNOSIS — L0291 Cutaneous abscess, unspecified: Secondary | ICD-10-CM

## 2011-04-25 LAB — CBC
Hemoglobin: 10.3 g/dL — ABNORMAL LOW (ref 13.0–17.0)
MCH: 26.9 pg (ref 26.0–34.0)
MCV: 85.1 fL (ref 78.0–100.0)
RBC: 3.83 MIL/uL — ABNORMAL LOW (ref 4.22–5.81)

## 2011-04-25 LAB — BASIC METABOLIC PANEL
CO2: 30 mEq/L (ref 19–32)
Calcium: 10.5 mg/dL (ref 8.4–10.5)
Creatinine, Ser: 0.85 mg/dL (ref 0.50–1.35)
Glucose, Bld: 101 mg/dL — ABNORMAL HIGH (ref 70–99)

## 2011-04-25 MED ORDER — SODIUM CHLORIDE 0.9 % IJ SOLN
10.0000 mL | Freq: Two times a day (BID) | INTRAMUSCULAR | Status: DC
  Administered 2011-04-25 – 2011-04-26 (×2): 10 mL

## 2011-04-25 MED ORDER — SODIUM CHLORIDE 0.9 % IJ SOLN
10.0000 mL | INTRAMUSCULAR | Status: DC | PRN
  Administered 2011-04-26 (×2): 10 mL

## 2011-04-25 NOTE — Progress Notes (Signed)
Subjective: Doing better, redness and swelling starting to improve Objective: Vital signs in last 24 hours: Temp:  [97.7 F (36.5 C)-99.2 F (37.3 C)] 97.7 F (36.5 C) (01/17 0612) Pulse Rate:  [76-97] 97  (01/17 0612) Resp:  [16-20] 16  (01/17 0612) BP: (122-170)/(70-90) 170/74 mmHg (01/17 0612) SpO2:  [95 %-96 %] 95 % (01/17 0612) Weight:  [73.165 kg (161 lb 4.8 oz)-75.297 kg (166 lb)] 73.165 kg (161 lb 4.8 oz) (01/16 1600) Weight change:  Last BM Date: 04/24/11  Intake/Output from previous day: 01/16 0701 - 01/17 0700 In: 360 [P.O.:360] Out: 150 [Urine:150]   Physical Exam: General: Alert, awake, oriented x3, in no acute distress. HEENT: No bruits, no goiter. Heart: Regular rate and rhythm, without murmurs, rubs, gallops. Lungs: Clear to auscultation bilaterally. Abdomen: Soft, nontender, nondistended, positive bowel sounds. Extremities:Left lower extremity with some improvement in erythema and swelling Neuro: Grossly intact, nonfocal.  Lab Results: Basic Metabolic Panel:  Basename 04/25/11 0325 04/24/11 1738  NA 138 139  K 4.8 4.3  CL 99 101  CO2 30 29  GLUCOSE 101* 117*  BUN 17 17  CREATININE 0.85 0.77  CALCIUM 10.5 10.1  MG -- --  PHOS -- --   Liver Function Tests:  Basename 04/24/11 1738  AST 17  ALT 21  ALKPHOS 239*  BILITOT 0.3  PROT 6.5  ALBUMIN 2.7*   No results found for this basename: LIPASE:2,AMYLASE:2 in the last 72 hours No results found for this basename: AMMONIA:2 in the last 72 hours CBC:  Basename 04/25/11 0325 04/24/11 1738  WBC 6.3 7.4  NEUTROABS -- 5.2  HGB 10.3* 9.8*  HCT 32.6* 30.5*  MCV 85.1 85.0  PLT 425* 393   Cardiac Enzymes: No results found for this basename: CKTOTAL:3,CKMB:3,CKMBINDEX:3,TROPONINI:3 in the last 72 hours BNP: No results found for this basename: PROBNP:3 in the last 72 hours D-Dimer: No results found for this basename: DDIMER:2 in the last 72 hours CBG: No results found for this basename: GLUCAP:6  in the last 72 hours Hemoglobin A1C: No results found for this basename: HGBA1C in the last 72 hours Fasting Lipid Panel: No results found for this basename: CHOL,HDL,LDLCALC,TRIG,CHOLHDL,LDLDIRECT in the last 72 hours Thyroid Function Tests: No results found for this basename: TSH,T4TOTAL,FREET4,T3FREE,THYROIDAB in the last 72 hours Anemia Panel: No results found for this basename: VITAMINB12,FOLATE,FERRITIN,TIBC,IRON,RETICCTPCT in the last 72 hours Coagulation: No results found for this basename: LABPROT:2,INR:2 in the last 72 hours Urine Drug Screen: Drugs of Abuse     Component Value Date/Time   LABOPIA NONE DETECTED 10/26/2007 2112   COCAINSCRNUR NONE DETECTED 10/26/2007 2112   LABBENZ POSITIVE* 10/26/2007 2112   AMPHETMU NONE DETECTED 10/26/2007 2112   THCU NONE DETECTED 10/26/2007 2112   LABBARB  Value: NONE DETECTED        DRUG SCREEN FOR MEDICAL PURPOSES ONLY.  IF CONFIRMATION IS NEEDED FOR ANY PURPOSE, NOTIFY LAB WITHIN 5 DAYS. 10/26/2007 2112    Alcohol Level: No results found for this basename: ETH:2 in the last 72 hours  Recent Results (from the past 240 hour(s))  MRSA PCR SCREENING     Status: Normal   Collection Time   04/25/11  6:33 AM      Component Value Range Status Comment   MRSA by PCR NEGATIVE  NEGATIVE  Final     Studies/Results: No results found.  Medications: Scheduled Meds:   . budesonide-formoterol  2 puff Inhalation BID  . calcium carbonate  1 tablet Oral TID  . carvedilol  25 mg Oral BID WC  . enoxaparin  40 mg Subcutaneous Q24H  . fentaNYL  100 mcg Transdermal Q3 days  . folic acid  1 mg Oral Daily  . gabapentin  300 mg Oral Q8H  . leflunomide  20 mg Oral Daily  . levothyroxine  150 mcg Oral QAC breakfast  . losartan  100 mg Oral Daily  . mulitivitamin with minerals  1 tablet Oral Daily  . predniSONE  5 mg Oral Daily  . vancomycin  1,000 mg Intravenous Once  . vancomycin  1,000 mg Intravenous Q12H  . vitamin C  500 mg Oral Daily  .  DISCONTD: calcium carbonate  600 mg Oral TID   Continuous Infusions:   . sodium chloride 50 mL/hr at 04/24/11 1802   PRN Meds:.acetaminophen, acetaminophen, cyclobenzaprine, HYDROmorphone (DILAUDID) injection, ondansetron (ZOFRAN) IV, ondansetron, oxyCODONE-acetaminophen, temazepam  Assessment/Plan: 1. Cellulitis of left leg  Continue  IV vancomycin, elevation of the left leg, pain control  Dopplers done negative for DVT  ESR and CRP significantly elevated Get records of MRI of left ankle done on 1/15 which per Dr.Van dam's report did not show any evidence of abscess or osteomyelitis  Request ID consult again 2. Psoriatic arthritis : continue low-dose prednisone and leflunomide, currently not exhibiting any signs symptoms of adrenal insufficiency  And pain control with Duragesic patch and Dilaudid 4. COPD stable  5. DVT prophylaxis with Lovenox  6. CODE STATUS full code    LOS: 1 day   [facility] Pager: [phone] 04/25/2011, 8:37 AM

## 2011-04-25 NOTE — Consult Note (Addendum)
Infectious Diseases Initial Consultation  Reason for Consultation:  cellulitis   HPI: Johnny Patrick is a 66 y.o. 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 Cleophas Dunker and then seen by ID for the leg.  He had an MRI of the leg which is reported to be negative for abscess or osteomyelitis or other significant issues.  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  He did have a tap of the ankle (not thought to be synovial fluid) that did have significant PMNs but did not improve with empiric therapy including the doxy and keflex and then levaquin.  He denies fever, chills, diarrhea.   Past Medical History  Diagnosis Date  . Chronic airway obstruction, not elsewhere classified   . Esophageal reflux   . Unspecified essential hypertension   . Anemia   . Hypothyroidism   . Osteoarthritis   . Nephrolithiasis   . Psoriatic arthritis   . Low back pain   . 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 MSSA (methicillin susceptible Staphylococcus aureus) septicemia     L 2nd toe-amputated June 2011  . Shingles     x2  . MRSA (methicillin resistant Staphylococcus aureus)   . Sleep apnea     Allergies:  Allergies  Allergen Reactions  . Augmentin   . Sulfur Rash    Current antibiotics:   MEDICATIONS:    . budesonide-formoterol  2 puff Inhalation BID  . calcium carbonate  1 tablet Oral TID  . carvedilol  25 mg Oral BID WC  . enoxaparin  40 mg Subcutaneous Q24H  . fentaNYL  100 mcg Transdermal Q3 days  . folic acid  1 mg Oral Daily  . gabapentin  300 mg Oral Q8H  . leflunomide  20 mg Oral Daily  . levothyroxine   150 mcg Oral QAC breakfast  . losartan  100 mg Oral Daily  . mulitivitamin with minerals  1 tablet Oral Daily  . predniSONE  5 mg Oral Daily  . vancomycin  1,000 mg Intravenous Once  . vancomycin  1,000 mg Intravenous Q12H  . vitamin C  500 mg Oral Daily  . DISCONTD: calcium carbonate  600 mg Oral TID    History  Substance Use Topics  . Smoking status: Former Smoker    Types: Cigarettes    Quit date: 04/24/1991  . Smokeless tobacco: Never Used  . Alcohol Use: No    Family History  Problem Relation Age of Onset  . Coronary artery disease Mother   . Pneumonia Mother   . Heart failure Mother   . Breast cancer Mother   . Psoriasis Father   . Coronary artery disease Brother     Review of Systems - Negative except as per HPI.  OBJECTIVE: Temp:  [97.7 F (36.5 C)-99.2 F (37.3 C)] 97.9 F (36.6 C) (01/17 1322) Pulse Rate:  [76-97] 80  (01/17 1322) Resp:  [16-20] 18  (01/17 1322) BP: (163-170)/(70-90) 163/73 mmHg (01/17 1322) SpO2:  [95 %-96 %] 95 % (01/17 1322) Weight:  [161 lb 4.8 oz (73.165 kg)] 161 lb 4.8 oz (73.165 kg) (01/16  1600) General appearance: alert, cooperative and no distress Resp: clear to auscultation bilaterally Cardio: regular rate and rhythm, S1, S2 normal, no murmur, click, rub or gallop Extremities: left lower extremity with significant erythema, warmth, edema., which the patient reports is much improved.    LABS: Results for orders placed during the hospital encounter of 04/24/11 (from the past 48 hour(s))  CBC     Status: Abnormal   Collection Time   04/24/11  5:38 PM      Component Value Range Comment   WBC 7.4  4.0 - 10.5 (K/uL)    RBC 3.59 (*) 4.22 - 5.81 (MIL/uL)    Hemoglobin 9.8 (*) 13.0 - 17.0 (g/dL)    HCT 16.1 (*) 09.6 - 52.0 (%)    MCV 85.0  78.0 - 100.0 (fL)    MCH 27.3  26.0 - 34.0 (pg)    MCHC 32.1  30.0 - 36.0 (g/dL)    RDW 04.5 (*) 40.9 - 15.5 (%)    Platelets 393  150 - 400 (K/uL)   COMPREHENSIVE METABOLIC PANEL     Status:  Abnormal   Collection Time   04/24/11  5:38 PM      Component Value Range Comment   Sodium 139  135 - 145 (mEq/L)    Potassium 4.3  3.5 - 5.1 (mEq/L)    Chloride 101  96 - 112 (mEq/L)    CO2 29  19 - 32 (mEq/L)    Glucose, Bld 117 (*) 70 - 99 (mg/dL)    BUN 17  6 - 23 (mg/dL)    Creatinine, Ser 8.11  0.50 - 1.35 (mg/dL)    Calcium 91.4  8.4 - 10.5 (mg/dL)    Total Protein 6.5  6.0 - 8.3 (g/dL)    Albumin 2.7 (*) 3.5 - 5.2 (g/dL)    AST 17  0 - 37 (U/L)    ALT 21  0 - 53 (U/L)    Alkaline Phosphatase 239 (*) 39 - 117 (U/L)    Total Bilirubin 0.3  0.3 - 1.2 (mg/dL)    GFR calc non Af Amer >90  >90 (mL/min)    GFR calc Af Amer >90  >90 (mL/min)   DIFFERENTIAL     Status: Normal   Collection Time   04/24/11  5:38 PM      Component Value Range Comment   Neutrophils Relative 71  43 - 77 (%)    Neutro Abs 5.2  1.7 - 7.7 (K/uL)    Lymphocytes Relative 21  12 - 46 (%)    Lymphs Abs 1.5  0.7 - 4.0 (K/uL)    Monocytes Relative 7  3 - 12 (%)    Monocytes Absolute 0.5  0.1 - 1.0 (K/uL)    Eosinophils Relative 1  0 - 5 (%)    Eosinophils Absolute 0.1  0.0 - 0.7 (K/uL)    Basophils Relative 0  0 - 1 (%)    Basophils Absolute 0.0  0.0 - 0.1 (K/uL)   SEDIMENTATION RATE     Status: Abnormal   Collection Time   04/24/11  5:38 PM      Component Value Range Comment   Sed Rate 123 (*) 0 - 16 (mm/hr)   C-REACTIVE PROTEIN     Status: Abnormal   Collection Time   04/24/11  5:38 PM      Component Value Range Comment   CRP 9.87 (*) <0.60 (mg/dL)   CBC     Status: Abnormal   Collection  Time   04/25/11  3:25 AM      Component Value Range Comment   WBC 6.3  4.0 - 10.5 (K/uL)    RBC 3.83 (*) 4.22 - 5.81 (MIL/uL)    Hemoglobin 10.3 (*) 13.0 - 17.0 (g/dL)    HCT 96.0 (*) 45.4 - 52.0 (%)    MCV 85.1  78.0 - 100.0 (fL)    MCH 26.9  26.0 - 34.0 (pg)    MCHC 31.6  30.0 - 36.0 (g/dL)    RDW 09.8 (*) 11.9 - 15.5 (%)    Platelets 425 (*) 150 - 400 (K/uL)   BASIC METABOLIC PANEL     Status: Abnormal    Collection Time   04/25/11  3:25 AM      Component Value Range Comment   Sodium 138  135 - 145 (mEq/L)    Potassium 4.8  3.5 - 5.1 (mEq/L)    Chloride 99  96 - 112 (mEq/L)    CO2 30  19 - 32 (mEq/L)    Glucose, Bld 101 (*) 70 - 99 (mg/dL)    BUN 17  6 - 23 (mg/dL)    Creatinine, Ser 1.47  0.50 - 1.35 (mg/dL)    Calcium 82.9  8.4 - 10.5 (mg/dL)    GFR calc non Af Amer 88 (*) >90 (mL/min)    GFR calc Af Amer >90  >90 (mL/min)   MRSA PCR SCREENING     Status: Normal   Collection Time   04/25/11  6:33 AM      Component Value Range Comment   MRSA by PCR NEGATIVE  NEGATIVE      MICRO: history of MSSA  IMAGING:MRI done which is reported to have some arthritis but not c.w septic arthritis.  No osteo or abscess.  No results found.  HISTORICAL MICRO/IMAGING  Assessment/Plan:  66 yo with cellulitis.  1) Cellulitis - I agree with the management.  He is on vancomycin and with the combination of antibiotic therapy and elevation he has improved.  Since he has been on multiple po antibiotics, I do feel he will be best served by Iv therapy with vancomycin.  I will order a PICC line and he will continue with vancomycin until it resolves (starting with a 2 week course and reexamine).  I will arrange follow up at [facility] with Dr. Daiva Eves or myself in about 12-14 days to see if he needs an extension of the therapy at that time or if it can be safely stopped.      If it continues to improve overnight or no significant changes, from an ID standpoint, he is ok to d/c in am with the follow up as described.     Thank you for helping with the management of this patient.

## 2011-04-26 MED ORDER — VANCOMYCIN HCL IN DEXTROSE 1-5 GM/200ML-% IV SOLN: 1000.0000 mg | Freq: Two times a day (BID) | INTRAVENOUS | Status: DC

## 2011-04-26 MED ORDER — HEPARIN SOD (PORK) LOCK FLUSH 100 UNIT/ML IV SOLN
250.0000 [IU] | INTRAVENOUS | Status: AC | PRN
  Administered 2011-04-26: 250 [IU]
  Filled 2011-04-26: qty 5

## 2011-04-26 NOTE — Progress Notes (Signed)
Pt did have 1 formed stool at 10:56 AM, called lab and they said they were unable to take sample unless it was loose or semi-formed.  Pt made aware. [name], Theresia Lo

## 2011-04-26 NOTE — Progress Notes (Signed)
   CARE MANAGEMENT NOTE 04/26/2011  Patient:  Johnny Patrick, Johnny Patrick   Account Number:  [id number]  Date Initiated:  04/26/2011  Documentation initiated by:  Lanier Clam  Subjective/Objective Assessment:   ADMITTED W/CELLULITIS L LEG.     Action/Plan:   FROM HOME W/FAMILY. USED [facility] IN PAST.HAS PCP,RW,3N1,SCOOTER, PHARMACY.   Anticipated DC Date:  04/26/2011   Anticipated DC Plan:  HOME W HOME HEALTH SERVICES         Choice offered to / List presented to:  C-1 Patient        HH arranged  HH-1 RN      [facility] agency  [facility]   Status of service:  In process, will continue to follow Medicare Important Message given?   (If response is "NO", the following Medicare IM given date fields will be blank) Date Medicare IM given:   Date Additional Medicare IM given:    Discharge Disposition:  HOME W HOME HEALTH SERVICES  Per UR Regulation:  Reviewed for med. necessity/level of care/duration of stay  Comments:  1/18 Zira Helinski RN,BSN NCM [phone] HAS PICC.HHRN-HOME IV ABX.WILL NEED IV ABX ORDERS-DRUG,DOSE,ROUTE,FREQ.PICC LINE FLUSH PER PROTOCAL,& LAB WORK ORDERS.PATIENT TO RECEIVE PM DOSE IN HOSPITAL.[facility] SUSAN DALE NOTIFIED.

## 2011-04-26 NOTE — Progress Notes (Signed)
Pt and wife given d/c instructions including meds, PICC care, when to call MD. Belongings packed. ABX for home delivered to room for pt. PICC line flushed per protocol.  D/C home with wife. Drequan Ironside, Theresia Lo

## 2011-04-26 NOTE — Discharge Summary (Signed)
Physician Discharge Summary  Patient ID: Johnny Patrick MRN: [id number] DOB/AGE: December 28, 1945 66 y.o.  Admit date: 04/24/2011 Discharge date: 04/26/2011  Primary Care Physician:  Georgianne Fick, MD, MD ID:Dr. Paulette Blanch [place] Orthopedic Surgeon: Dr. Norlene Campbell  Discharge Diagnoses:   1. Severe cellulitis of left lower extremity and foot 2. Psoriatic arthritis on immunosuppressants 3. GERD 4. COPD 5. history of MSSA sepsis and right foot infection 6. remote history of cryptococcal infection of the arm 7. obstructive sleep apnea 8. chronic low back pain 9. history of nephrolithiasis 10. Hypothyroidism 11. Osteoarthritis  Current Discharge Medication List    START taking these medications   Details  vancomycin (VANCOCIN) 1 GM/200ML SOLN Inject 200 mLs (1,000 mg total) into the vein every 12 (twelve) hours. Vancomycin dosing to be adjusted by Pharmacy per protocol using trough and creatinine clearance Qty: 200 mL, Refills: 1      CONTINUE these medications which have NOT CHANGED   Details  Ascorbic Acid (VITAMIN C) 500 MG CHEW Chew 500 mg by mouth daily.      budesonide-formoterol (SYMBICORT) 160-4.5 MCG/ACT inhaler Inhale 2 puffs into the lungs 2 (two) times daily. Qty: 1 Inhaler, Refills: 2    calcium carbonate (CALCIUM 600) 600 MG TABS Take 600 mg by mouth 3 (three) times daily.      carvedilol (COREG) 25 MG tablet TAKE ONE TABLET TWICE DAILY Qty: 60 tablet, Refills: 6    Cholecalciferol 400 UNITS CAPS Take 1 capsule by mouth daily.     cyclobenzaprine (FLEXERIL) 10 MG tablet Take 10 mg by mouth every 8 (eight) hours as needed. For muscle pain.    fentaNYL (DURAGESIC - DOSED MCG/HR) 100 MCG/HR Place 1 patch onto the skin every 3 (three) days.      folic acid (FOLVITE) 1 MG tablet Take 1 mg by mouth daily.      furosemide (LASIX) 40 MG tablet Take 80 mg by mouth daily.     gabapentin (NEURONTIN) 300 MG capsule Take 300 mg by mouth every 8 (eight) hours.      HYDROmorphone (DILAUDID) 2 MG tablet Take 2 mg by mouth every 4 (four) hours.      leflunomide (ARAVA) 20 MG tablet Take 20 mg by mouth daily.      levothyroxine (SYNTHROID, LEVOTHROID) 150 MCG tablet Take 150 mcg by mouth daily.      losartan (COZAAR) 100 MG tablet Take 1 tablet (100 mg total) by mouth daily. Qty: 30 tablet, Refills: 6    Multiple Vitamin (MULITIVITAMIN WITH MINERALS) TABS Take 1 tablet by mouth daily.    Omega-3 Fatty Acids (FISH OIL) 1000 MG CAPS Take 1 capsule by mouth 3 (three) times daily.     oxyCODONE-acetaminophen (PERCOCET) 5-325 MG per tablet Take 1 tablet by mouth every 4 (four) hours as needed. For pain.    potassium chloride SA (KLOR-CON M20) 20 MEQ tablet Take 20 mEq by mouth daily.     predniSONE (DELTASONE) 5 MG tablet Take 5 mg by mouth daily.      temazepam (RESTORIL) 15 MG capsule Take 15-30 mg by mouth at bedtime as needed. For sleep.      STOP taking these medications     doxycycline (VIBRA-TABS) 100 MG tablet      levofloxacin (LEVAQUIN) 500 MG tablet          Disposition and Follow-up:  Dr. Daiva Eves in 2 weeks  Significant Diagnostic Studies:  No results found.  Brief H and P: Johnny Patrick is a  66 year old gentleman with history of psoriatic arthritis on immunosuppressants and multiple complex and joint infections in the past was sent to the hospital by Dr. Zenaida Niece dam due to worsening cellulitis of his left lower extremity which failed outpatient antibiotic therapy  Hospital Course:  1. Cellulitis of lower extremity: He had previously been treated with about 10 days worth of doxycycline and Keflex/Levaquin prior to admission, he also had an attempted joint aspiration of his left ankle which yielded PMNs greater than 90%, multiple WBCs but it was not felt to be synovial fluid. He also had an MRI of his ankle and foot one day prior to admission per Dr. Cleophas Dunker which did not show any evidence of abscess or osteomyelitis, it only showed  minimal edema in the tibia which is felt to be consistent with arthritis but not septic arthritis. Subsequently sent to the hospital since he failed outpatient oral antibiotic therapy.  He was started on IV vancomycin and treated with leg elevation as well,  had very good clinical response to therapy, was seen by Dr. Luciana Axe from infectious disease in consultation who recommended treating him for 2 weeks in total with vancomycin using a PICC line.  Hence at this point given excellent clinical response he will be discharged home on 12 more days of vancomycin to complete a two-week course, he is advised to followup with Dr. Daiva Eves in the infectious disease clinic in 2 weeks, elevation is being emphasized as well. Home health agency is instructed to manage his vancomycin dosing based on Vanc trough levels and creatinine clearance  Time spent on Discharge:  Signed: Nolawi Kanady [facility] Pager: [phone] 04/26/2011, 2:12 PM

## 2011-04-26 NOTE — Progress Notes (Signed)
MD, pt. Reported that after discharge he would like vancomycin solution via syringe to be ordered (his wife who will be administering this prefers this method).  Just relaying this message.

## 2011-04-29 ENCOUNTER — Telehealth: Payer: Self-pay | Admitting: *Deleted

## 2011-04-29 NOTE — Telephone Encounter (Signed)
Laney Potash , a nurse from [facility] called to ask about pulling the PICC. The meds are due to be done 05/09/11 and they can pull the line with an order may call her cell at [phone] or fax to [phone] His next appt here is 05/07/11 with Dr. Luciana Axe

## 2011-04-29 NOTE — Telephone Encounter (Signed)
Sounds reasonable to me - thanks!

## 2011-04-29 NOTE — Telephone Encounter (Signed)
It depends how the leg looks on the 29th.

## 2011-04-30 NOTE — Telephone Encounter (Signed)
I called the rn back & told her md will decide at office visit on 29th

## 2011-05-06 DIAGNOSIS — E119 Type 2 diabetes mellitus without complications: Secondary | ICD-10-CM | POA: Diagnosis not present

## 2011-05-07 ENCOUNTER — Inpatient Hospital Stay: Payer: Medicare Other | Admitting: Internal Medicine

## 2011-05-09 ENCOUNTER — Encounter: Payer: Self-pay | Admitting: Infectious Disease

## 2011-05-09 ENCOUNTER — Ambulatory Visit (INDEPENDENT_AMBULATORY_CARE_PROVIDER_SITE_OTHER): Payer: Medicare Other | Admitting: Infectious Disease

## 2011-05-09 DIAGNOSIS — I10 Essential (primary) hypertension: Secondary | ICD-10-CM | POA: Insufficient documentation

## 2011-05-09 DIAGNOSIS — L03119 Cellulitis of unspecified part of limb: Secondary | ICD-10-CM

## 2011-05-09 DIAGNOSIS — L02419 Cutaneous abscess of limb, unspecified: Secondary | ICD-10-CM

## 2011-05-09 NOTE — Assessment & Plan Note (Signed)
BP was quite elevated in clinic. Pt asymptomatic. Wife and he say it has been running normal at home at that this is due to stress. Will remeasure and have fu with PCP

## 2011-05-09 NOTE — Assessment & Plan Note (Signed)
Extend vancmycin for one more week and then stop. If his fused ankle truly is infected we will let this declare itself OFF antibiotics

## 2011-05-09 NOTE — Progress Notes (Signed)
Subjective:    Patient ID: STACIE KNUTZEN, male    DOB: 12-Feb-1946, 65 y.o.   MRN: [id number]  HPI  66 year old Caucasian male with complicated PMHS including prior cryptococcal infection of arm, psoriatic arthritis (prev on methotrexate and prednisone), prev L THR (02-2008), prev R foot MSSA infection with sepsis 2009. He subsequently developed infection in his L second toe (MRI no osteo) And her underwent amputation 09-08-09. Cx's MSSA. He was then seen Feb 2012 with cellulits of his R hand after a presumed cut to his hand. He completed a course of doxy for this. MRI showed no osteo, abscess or myofasciitis. By 3-13 he had worsening of swelling, erythema in his hand. He was rec to go to the ED which he refused. He got another 20 days of doxy after this then 10 days of bactrim (completed 07-02-10). Was seen in ID clinic 07-11-10 and was started on 30 more days of doxy (he was given choice of PIC or PO). He was seen again in ID clinic May 2012 and was to be continued on doxy to August 2012. (he was seen by Dr. [name] multiple times along this course). 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 ARe became edematous, and exquisitely tender to palpation. He began taking his home doxycycline. He was then seen by Norlene Campbell who saw him on Friday last week and tapped a fluid collection which showed no crystals multiple wbc predominantly PMNS >90%. Cell count was not done. He was switched to doxy and keflex and continued this thru following Monday at which time was re-evaluated by Dr. Cleophas Dunker with no improvement and changed to levaquin and doxycyline. He had MRI done prior to visit with me which showed per Dr. Jena Gauss showed no abscess, no osteomyelitis, only minimal edema in tibia that he feels is c/w arthritis but not septic arthritis and fused joint.. He has ESR, was markedly elevated above 100 (but could be due to his arthritis).  I had him admitted to  the hospital where he was placed on IV vancomycin with rapid improvement in his erythema and pain per my colleague Dr. Luciana Axe who folllowed pt at [facility]. Pt has remaiend on vancomycin since with continued improvement in appearance of his leg. Ankle is still swollen and minimally tender. Pt had stopped his lasix in the interim. He is keeping leg elevated NO fevers, chills or nausea. I spent greater than 45 minutes with the patient including greater than 50% of time in face to face counsel of the patient and in coordination of their care.  Review of Systems  Constitutional: Negative for fever, chills, diaphoresis, activity change, appetite change, fatigue and unexpected weight change.  HENT: Negative for congestion, sore throat, rhinorrhea, sneezing, trouble swallowing and sinus pressure.   Eyes: Negative for photophobia and visual disturbance.  Respiratory: Negative for cough, chest tightness, shortness of breath, wheezing and stridor.   Cardiovascular: Negative for chest pain, palpitations and leg swelling.  Gastrointestinal: Negative for nausea, vomiting, abdominal pain, diarrhea, constipation, blood in stool, abdominal distention and anal bleeding.  Genitourinary: Negative for dysuria, hematuria, flank pain and difficulty urinating.  Musculoskeletal: Positive for myalgias and joint swelling. Negative for back pain, arthralgias and gait problem.  Skin: Negative for color change, pallor, rash and wound.  Neurological: Negative for dizziness, tremors, weakness and light-headedness.  Hematological: Negative for adenopathy. Does not bruise/bleed easily.  Psychiatric/Behavioral: Negative for behavioral problems, confusion, sleep disturbance, dysphoric  mood, decreased concentration and agitation.       Objective:   Physical Exam  Constitutional: He is oriented to person, place, and time. He appears well-developed and well-nourished. No distress.  HENT:  Head: Normocephalic and atraumatic.    Mouth/Throat: Oropharynx is clear and moist. No oropharyngeal exudate.  Eyes: Conjunctivae and EOM are normal. Pupils are equal, round, and reactive to light. No scleral icterus.  Neck: Normal range of motion. Neck supple. No JVD present.  Cardiovascular: Normal rate, regular rhythm and normal heart sounds.  Exam reveals no gallop and no friction rub.   No murmur heard. Pulmonary/Chest: Effort normal and breath sounds normal. No respiratory distress. He has no wheezes. He has no rales. He exhibits no tenderness.  Abdominal: He exhibits no distension and no mass. There is no tenderness. There is no rebound and no guarding.  Musculoskeletal: He exhibits no edema and no tenderness.       Feet:  Lymphadenopathy:    He has no cervical adenopathy.  Neurological: He is alert and oriented to person, place, and time. He has normal reflexes. He exhibits normal muscle tone. Coordination normal.  Skin: Skin is warm and dry. He is not diaphoretic. There is erythema. No pallor.     Psychiatric: He has a normal mood and affect. His behavior is normal. Judgment and thought content normal.          Assessment & Plan:

## 2011-05-13 ENCOUNTER — Telehealth: Payer: Self-pay | Admitting: *Deleted

## 2011-05-13 DIAGNOSIS — E119 Type 2 diabetes mellitus without complications: Secondary | ICD-10-CM | POA: Diagnosis not present

## 2011-05-13 DIAGNOSIS — E039 Hypothyroidism, unspecified: Secondary | ICD-10-CM | POA: Diagnosis not present

## 2011-05-13 DIAGNOSIS — I10 Essential (primary) hypertension: Secondary | ICD-10-CM | POA: Diagnosis not present

## 2011-05-13 DIAGNOSIS — IMO0002 Reserved for concepts with insufficient information to code with codable children: Secondary | ICD-10-CM | POA: Diagnosis not present

## 2011-05-13 NOTE — Telephone Encounter (Signed)
Victorino Dike with [facility] called and asked if Dr Daiva Eves could give her an order to remove the patients PICC on Friday 05/17/11 as he is due to have his last dose of medication on Thursday 05/16/11. Contact number is [phone] or fax order to [phone]. Advised her we will get back to her ASAP.

## 2011-05-15 NOTE — Telephone Encounter (Signed)
Called her and verbal order given

## 2011-05-17 ENCOUNTER — Encounter: Payer: Self-pay | Admitting: Infectious Disease

## 2011-05-28 DIAGNOSIS — M47817 Spondylosis without myelopathy or radiculopathy, lumbosacral region: Secondary | ICD-10-CM | POA: Diagnosis not present

## 2011-06-10 ENCOUNTER — Ambulatory Visit (INDEPENDENT_AMBULATORY_CARE_PROVIDER_SITE_OTHER): Payer: Medicare Other | Admitting: Infectious Disease

## 2011-06-10 ENCOUNTER — Encounter: Payer: Self-pay | Admitting: Infectious Disease

## 2011-06-10 DIAGNOSIS — L02419 Cutaneous abscess of limb, unspecified: Secondary | ICD-10-CM | POA: Diagnosis not present

## 2011-06-10 DIAGNOSIS — L03119 Cellulitis of unspecified part of limb: Secondary | ICD-10-CM

## 2011-06-10 NOTE — Progress Notes (Signed)
Subjective:    Patient ID: Johnny Patrick, male    DOB: [birth year], 66 y.o.   MRN: [id number]  HPI  66 year old Caucasian male with complicated PMHS including prior cryptococcal infection of arm, psoriatic arthritis (prev on methotrexate and prednisone), prev L THR (02-2008), prev R foot MSSA infection with sepsis 2009. He subsequently developed infection in his L second toe (MRI no osteo) And her underwent amputation 09-08-09. Cx's MSSA. He was then seen Feb 2012 with cellulits of his R hand after a presumed cut to his hand. He completed a course of doxy for this. MRI showed no osteo, abscess or myofasciitis. By 3-13 he had worsening of swelling, erythema in his hand. He was rec to go to the ED which he refused. He got another 20 days of doxy after this then 10 days of bactrim (completed 07-02-10). Was seen in ID clinic 07-11-10 and was started on 30 more days of doxy (he was given choice of PIC or PO). He was seen again in ID clinic May 2012 and was to be continued on doxy to August 2012. (he was seen by Dr. [name] multiple times along this course). 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 ARe became edematous, and exquisitely tender to palpation. He began taking his home doxycycline. He was then seen by Norlene Campbell who saw him on Friday last week and tapped a fluid collection which showed no crystals multiple wbc predominantly PMNS >90%. Cell count was not done. He was switched to doxy and keflex and continued this thru following Monday at which time was re-evaluated by Dr. Cleophas Dunker with no improvement and changed to levaquin and doxycyline. He had MRI done prior to visit with me which showed per Dr. Jena Gauss showed no abscess, no osteomyelitis, only minimal edema in tibia that he feels is c/w arthritis but not septic arthritis and fused joint.. He has ESR, was markedly elevated above 100 (but could be due to his arthritis). I had him admitted to  the hospital where he was placed on IV vancomycin with rapid improvement in his erythema and pain per my colleague Dr. Luciana Axe who folllowed pt at [facility]. Pt has remaiend on vancomycin since with continued improvement in appearance of his leg. i SAW him last month and had plans to observe him off of abx. However in interim he developed ankle swellin and restarted oral doxycline that he has been taking for past 2 to 3 weeks. Swelling improved at night. He has not been taking his diuretics reliably. His psoriatic arthritis is not being aggressively rx. I have asked him to let us observe him OFF antibiotics and rtc in 2 months.  Review of Systems  Constitutional: Negative for fever, chills, diaphoresis, activity change, appetite change, fatigue and unexpected weight change.  HENT: Negative for congestion, sore throat, rhinorrhea, sneezing, trouble swallowing and sinus pressure.   Eyes: Negative for photophobia and visual disturbance.  Respiratory: Negative for cough, chest tightness, shortness of breath, wheezing and stridor.   Cardiovascular: Negative for chest pain, palpitations and leg swelling.  Gastrointestinal: Negative for nausea, vomiting, abdominal pain, diarrhea, constipation, blood in stool, abdominal distention and anal bleeding.  Genitourinary: Negative for dysuria, hematuria, flank pain and difficulty urinating.  Musculoskeletal: Positive for joint swelling and arthralgias. Negative for myalgias, back pain and gait problem.  Skin: Negative for color change, pallor, rash and wound.  Neurological: Negative for dizziness, tremors, weakness and light-headedness.  Hematological:  Negative for adenopathy. Does not bruise/bleed easily.  Psychiatric/Behavioral: Negative for behavioral problems, confusion, sleep disturbance, dysphoric mood, decreased concentration and agitation.       Objective:   Physical Exam  Constitutional: He is oriented to person, place, and time. He appears well-developed  and well-nourished. No distress.  HENT:  Head: Normocephalic and atraumatic.  Mouth/Throat: Oropharynx is clear and moist. No oropharyngeal exudate.  Eyes: Conjunctivae and EOM are normal. Pupils are equal, round, and reactive to light. No scleral icterus.  Neck: Normal range of motion. Neck supple. No JVD present.  Cardiovascular: Normal rate, regular rhythm and normal heart sounds.  Exam reveals no gallop and no friction rub.   No murmur heard. Pulmonary/Chest: Effort normal and breath sounds normal. No respiratory distress. He has no wheezes. He has no rales. He exhibits no tenderness.  Abdominal: He exhibits no distension and no mass. There is no tenderness. There is no rebound and no guarding.  Musculoskeletal: He exhibits no edema and no tenderness.       Feet:  Lymphadenopathy:    He has no cervical adenopathy.  Neurological: He is alert and oriented to person, place, and time. He has normal reflexes. He exhibits normal muscle tone. Coordination normal.  Skin: Skin is warm and dry. He is not diaphoretic. No erythema. No pallor.  Psychiatric: He has a normal mood and affect. His behavior is normal. Judgment and thought content normal.          Assessment & Plan:  Cellulitis, leg concnern has been for deeper infection but no concrete evidence so far. Observe off abx and see back in 2 months

## 2011-06-10 NOTE — Assessment & Plan Note (Signed)
concnern has been for deeper infection but no concrete evidence so far. Observe off abx and see back in 2 months

## 2011-06-19 DIAGNOSIS — H1130 Conjunctival hemorrhage, unspecified eye: Secondary | ICD-10-CM | POA: Diagnosis not present

## 2011-06-20 ENCOUNTER — Ambulatory Visit (INDEPENDENT_AMBULATORY_CARE_PROVIDER_SITE_OTHER): Payer: Medicare Other | Admitting: Cardiology

## 2011-06-20 ENCOUNTER — Encounter: Payer: Self-pay | Admitting: Cardiology

## 2011-06-20 VITALS — BP 132/80 | HR 92 | Ht 66.0 in | Wt 162.0 lb

## 2011-06-20 DIAGNOSIS — T50904A Poisoning by unspecified drugs, medicaments and biological substances, undetermined, initial encounter: Secondary | ICD-10-CM

## 2011-06-20 DIAGNOSIS — I10 Essential (primary) hypertension: Secondary | ICD-10-CM | POA: Diagnosis not present

## 2011-06-20 DIAGNOSIS — F19921 Other psychoactive substance use, unspecified with intoxication with delirium: Secondary | ICD-10-CM

## 2011-06-20 MED ORDER — GABAPENTIN 100 MG PO CAPS: 200.0000 mg | ORAL_CAPSULE | Freq: Three times a day (TID) | ORAL | Status: DC

## 2011-06-20 MED ORDER — LOSARTAN POTASSIUM 50 MG PO TABS: 50.0000 mg | ORAL_TABLET | Freq: Every day | ORAL | Status: DC

## 2011-06-20 NOTE — Assessment & Plan Note (Signed)
I have asked  him  to decrease his gabapentin to  200 mg 3 times a day. We'll check metabolic panel looking at renal function today.  I advised his wife that if he continues to run a low-grade fever to see Dr.Van Dam or colleagues infectious disease.

## 2011-06-20 NOTE — Assessment & Plan Note (Signed)
I think he is overmedicated at this point. I will decrease his losartan 50 mg a day. We'll check a metabolic profile today weight his renal function. And in her bowel seen back for close followup in 2 weeks or less for his blood pressure and some of his confusion. It may be related to the low blood pressure and/or to gabapentin.

## 2011-06-20 NOTE — Progress Notes (Signed)
HPI Johnny Patrick comes in today for evaluation and management of some recent problems with hypotension and confusion.  He has always had difficult to control blood pressure associated with significant lower extremity edema.  His wife states that he has been somewhat confused starting with a phone conversation on Sunday night. He was better on Monday. On Tuesday he complained of lightheadedness and couldn't finish the blessing. His blood pressure was 90/50 in the right arm and 80/40 in the left arm.  He's also had low-grade fever 100.1 twice this week. He was just treated for saline this in the left lower extremity but finished this treatment 2 weeks ago. It has  totally cleared.  He is a little bit restless at times and has had some jerking motion in his arms off and on. There's been no true seizure.  Past Medical History  Diagnosis Date  . Chronic airway obstruction, not elsewhere classified   . Esophageal reflux   . Unspecified essential hypertension   . Anemia   . Hypothyroidism   . Osteoarthritis   . Nephrolithiasis   . Psoriatic arthritis   . Low back pain   . 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 MSSA (methicillin susceptible Staphylococcus aureus) septicemia     L 2nd toe-amputated June 2011  . Shingles     x2  . MRSA (methicillin resistant Staphylococcus aureus)   . Sleep apnea     Current Outpatient Prescriptions  Medication Sig Dispense Refill  . Ascorbic Acid (VITAMIN C) 500 MG CHEW Chew 500 mg by mouth daily.        . budesonide-formoterol (SYMBICORT) 160-4.5 MCG/ACT inhaler Inhale 2 puffs into the lungs 2 (two) times daily.  1 Inhaler  2  . calcium carbonate (CALCIUM 600) 600 MG TABS Take 600 mg by mouth 3 (three) times daily.        . carvedilol (COREG) 25 MG tablet TAKE ONE TABLET TWICE DAILY  60 tablet  6  . Cholecalciferol 400 UNITS CAPS Take 1 capsule by mouth daily.       . cyclobenzaprine (FLEXERIL) 10 MG tablet Take 10 mg by mouth every 8 (eight) hours as needed.  For muscle pain.      . fentaNYL (DURAGESIC - DOSED MCG/HR) 100 MCG/HR Place 1 patch onto the skin every 3 (three) days.        . folic acid (FOLVITE) 1 MG tablet Take 1 mg by mouth daily.        . furosemide (LASIX) 40 MG tablet Take 80 mg by mouth daily.       Marland Kitchen gabapentin (NEURONTIN) 300 MG capsule Take 300 mg by mouth every 8 (eight) hours.        Marland Kitchen HYDROmorphone (DILAUDID) 2 MG tablet Take 2 mg by mouth every 4 (four) hours.        Marland Kitchen leflunomide (ARAVA) 20 MG tablet Take 20 mg by mouth daily.        Marland Kitchen levothyroxine (SYNTHROID, LEVOTHROID) 150 MCG tablet Take 150 mcg by mouth daily.        Marland Kitchen losartan (COZAAR) 100 MG tablet Take 1 tablet (100 mg total) by mouth daily.  30 tablet  6  . Multiple Vitamin (MULITIVITAMIN WITH MINERALS) TABS Take 1 tablet by mouth daily.      . Omega-3 Fatty Acids (FISH OIL) 1000 MG CAPS Take 1 capsule by mouth 3 (three) times daily.       Marland Kitchen oxyCODONE-acetaminophen (PERCOCET) 5-325 MG per tablet Take  1 tablet by mouth every 4 (four) hours as needed. For pain.      . potassium chloride SA (KLOR-CON M20) 20 MEQ tablet Take 20 mEq by mouth daily.       . predniSONE (DELTASONE) 5 MG tablet Take 5 mg by mouth daily.        . temazepam (RESTORIL) 15 MG capsule Take 15-30 mg by mouth at bedtime as needed. For sleep.        Allergies  Allergen Reactions  . Augmentin   . Sulfur Rash    Family History  Problem Relation Age of Onset  . Coronary artery disease Mother   . Pneumonia Mother   . Heart failure Mother   . Breast cancer Mother   . Psoriasis Father   . Coronary artery disease Brother     History   Social History  . Marital Status: Married    Spouse Name: N/A    Number of Children: N/A  . Years of Education: N/A   Occupational History  . retired    Social History Main Topics  . Smoking status: Former Smoker    Types: Cigarettes    Quit date: 04/24/1991  . Smokeless tobacco: Never Used  . Alcohol Use: No  . Drug Use: No  . Sexually Active:  Not on file   Other Topics Concern  . Not on file   Social History Narrative  . No narrative on file    ROS ALL NEGATIVE EXCEPT THOSE NOTED IN HPI  PE  General Appearance: well developed, well nourished in no acute distress, looks normal baseline HEENT: symmetrical face, PERRLA, good dentition  Neck: no JVD, thyromegaly, or adenopathy, trachea midline Chest: symmetric without deformity Cardiac: PMI non-displaced, RRR, normal S1, S2, no gallop or murmur Lung: clear to ausculation and percussion Vascular: all pulses full without bruits  Abdominal: nondistended, nontender, good bowel sounds, no HSM, no bruits Extremities: no cyanosis, clubbing or edema, no sign of DVT, no varicosities  Skin: normal color, no rashes Neuro: alert and oriented x 3, non-focal Pysch: normal affect  EKG  BMET    Component Value Date/Time   NA 138 04/25/2011 0325   K 4.8 04/25/2011 0325   CL 99 04/25/2011 0325   CO2 30 04/25/2011 0325   GLUCOSE 101* 04/25/2011 0325   BUN 17 04/25/2011 0325   CREATININE 0.85 04/25/2011 0325   CREATININE 0.70 11/01/2009 1146   CALCIUM 10.5 04/25/2011 0325   GFRNONAA 88* 04/25/2011 0325   GFRAA >90 04/25/2011 0325    Lipid Panel     Component Value Date/Time   CHOL  Value: 136        ATP III CLASSIFICATION:  <200     mg/dL   Desirable  213-086  mg/dL   Borderline High  >=578    mg/dL   High        4/69/6295 0340   TRIG 158* 09/04/2009 0340   HDL 44 09/04/2009 0340   CHOLHDL 3.1 09/04/2009 0340   VLDL 32 09/04/2009 0340   LDLCALC  Value: 60        Total Cholesterol/HDL:CHD Risk Coronary Heart Disease Risk Table                     Men   Women  1/2 Average Risk   3.4   3.3  Average Risk       5.0   4.4  2 X Average Risk   9.6   7.1  3 X Average Risk  23.4   11.0        Use the calculated Patient Ratio above and the CHD Risk Table to determine the patient's CHD Risk.        ATP III CLASSIFICATION (LDL):  <100     mg/dL   Optimal  161-096  mg/dL   Near or Above                     Optimal  130-159  mg/dL   Borderline  045-409  mg/dL   High  >811     mg/dL   Very High 12/21/7827 5621    CBC    Component Value Date/Time   WBC 6.3 04/25/2011 0325   WBC 7.5 08/28/2010 1427   RBC 3.83* 04/25/2011 0325   RBC 3.77* 08/28/2010 1427   HGB 10.3* 04/25/2011 0325   HGB 11.3* 08/28/2010 1427   HCT 32.6* 04/25/2011 0325   HCT 33.5* 08/28/2010 1427   PLT 425* 04/25/2011 0325   PLT 264 08/28/2010 1427   MCV 85.1 04/25/2011 0325   MCV 88.7 08/28/2010 1427   MCH 26.9 04/25/2011 0325   MCH 30.1 08/28/2010 1427   MCHC 31.6 04/25/2011 0325   MCHC 33.9 08/28/2010 1427   RDW 16.3* 04/25/2011 0325   RDW 16.8* 08/28/2010 1427   LYMPHSABS 1.5 04/24/2011 1738   LYMPHSABS 1.7 08/28/2010 1427   MONOABS 0.5 04/24/2011 1738   MONOABS 0.5 08/28/2010 1427   EOSABS 0.1 04/24/2011 1738   EOSABS 0.2 08/28/2010 1427   BASOSABS 0.0 04/24/2011 1738   BASOSABS 0.0 08/28/2010 1427

## 2011-06-20 NOTE — Patient Instructions (Signed)
Your physician recommends that you schedule a follow-up appointment in: 2-3 weeks with Dr. Daleen Squibb.  Decrease Losartan to 50mg  daily Decrease Neurontin to 200mg  three times per day.  LABS TODAY:  BMET

## 2011-06-21 LAB — BASIC METABOLIC PANEL
Chloride: 100 mEq/L (ref 96–112)
GFR: 49.44 mL/min — ABNORMAL LOW (ref >60.00)
Potassium: 5.2 mEq/L — ABNORMAL HIGH (ref 3.5–5.1)
Sodium: 141 mEq/L (ref 135–145)

## 2011-07-15 ENCOUNTER — Ambulatory Visit (INDEPENDENT_AMBULATORY_CARE_PROVIDER_SITE_OTHER): Payer: Medicare Other | Admitting: Cardiology

## 2011-07-15 ENCOUNTER — Encounter: Payer: Self-pay | Admitting: Cardiology

## 2011-07-15 VITALS — BP 145/86 | HR 75 | Wt 157.0 lb

## 2011-07-15 DIAGNOSIS — I10 Essential (primary) hypertension: Secondary | ICD-10-CM

## 2011-07-15 DIAGNOSIS — R609 Edema, unspecified: Secondary | ICD-10-CM

## 2011-07-15 MED ORDER — FUROSEMIDE 40 MG PO TABS: 40.0000 mg | ORAL_TABLET | Freq: Two times a day (BID) | ORAL | Status: AC

## 2011-07-15 NOTE — Progress Notes (Signed)
HPI Mr. Johnny Patrick  returns today for close followup. I saw him about a month ago at which time he was having some confusion and hypotension. I cut his losartan back in half and decreased his Neurontin. He is cleared and improved. His metabolic profile suggests some dehydration. We asked him to increase fluid intake.  His biggest complaint today is that he's had some swelling particularly in the day in his left medial ankle area. He said old trauma to that ankle back in the 60s and recently got over cellulitis. When it swells and hurts. It goes down the morning.  He is no longer having any low-grade fever. Past Medical History  Diagnosis Date  . Chronic airway obstruction, not elsewhere classified   . Esophageal reflux   . Unspecified essential hypertension   . Anemia   . Hypothyroidism   . Osteoarthritis   . Nephrolithiasis   . Psoriatic arthritis   . Low back pain   . 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 MSSA (methicillin susceptible Staphylococcus aureus) septicemia     L 2nd toe-amputated June 2011  . Shingles     x2  . MRSA (methicillin resistant Staphylococcus aureus)   . Sleep apnea     Current Outpatient Prescriptions  Medication Sig Dispense Refill  . Ascorbic Acid (VITAMIN C) 500 MG CHEW Chew 500 mg by mouth daily.        . budesonide-formoterol (SYMBICORT) 160-4.5 MCG/ACT inhaler Inhale 2 puffs into the lungs 2 (two) times daily.  1 Inhaler  2  . calcium carbonate (CALCIUM 600) 600 MG TABS Take 600 mg by mouth 3 (three) times daily.        . carvedilol (COREG) 25 MG tablet TAKE ONE TABLET TWICE DAILY  60 tablet  6  . Cholecalciferol 400 UNITS CAPS Take 1 capsule by mouth daily.       . cyclobenzaprine (FLEXERIL) 10 MG tablet Take 10 mg by mouth every 8 (eight) hours as needed. For muscle pain.      . fentaNYL (DURAGESIC - DOSED MCG/HR) 100 MCG/HR Place 1 patch onto the skin every 3 (three) days.        . folic acid (FOLVITE) 1 MG tablet Take 1 mg by mouth daily.        Marland Kitchen  gabapentin (NEURONTIN) 100 MG capsule Take 2 capsules (200 mg total) by mouth 3 (three) times daily.  180 capsule  3  . HYDROmorphone (DILAUDID) 2 MG tablet Take 2 mg by mouth every 4 (four) hours.        Marland Kitchen leflunomide (ARAVA) 20 MG tablet Take 20 mg by mouth daily.        Marland Kitchen levothyroxine (SYNTHROID, LEVOTHROID) 150 MCG tablet Take 150 mcg by mouth daily.        Marland Kitchen losartan (COZAAR) 50 MG tablet Take 1 tablet (50 mg total) by mouth daily.  30 tablet  6  . Multiple Vitamin (MULITIVITAMIN WITH MINERALS) TABS Take 1 tablet by mouth daily.      . Omega-3 Fatty Acids (FISH OIL) 1000 MG CAPS Take 1 capsule by mouth 3 (three) times daily.       Marland Kitchen oxyCODONE-acetaminophen (PERCOCET) 5-325 MG per tablet Take 1 tablet by mouth every 4 (four) hours as needed. For pain.      . potassium chloride SA (KLOR-CON M20) 20 MEQ tablet Take 20 mEq by mouth daily.       . predniSONE (DELTASONE) 5 MG tablet Take 5 mg by mouth  daily.        . temazepam (RESTORIL) 15 MG capsule Take 15-30 mg by mouth at bedtime as needed. For sleep.      . furosemide (LASIX) 40 MG tablet Take 1 tablet (40 mg total) by mouth 2 (two) times daily.  60 tablet  12    Allergies  Allergen Reactions  . Augmentin   . Sulfur Rash    Family History  Problem Relation Age of Onset  . Coronary artery disease Mother   . Pneumonia Mother   . Heart failure Mother   . Breast cancer Mother   . Psoriasis Father   . Coronary artery disease Brother     History   Social History  . Marital Status: Married    Spouse Name: N/A    Number of Children: N/A  . Years of Education: N/A   Occupational History  . retired    Social History Main Topics  . Smoking status: Former Smoker    Types: Cigarettes    Quit date: 04/24/1991  . Smokeless tobacco: Never Used  . Alcohol Use: No  . Drug Use: No  . Sexually Active: Not on file   Other Topics Concern  . Not on file   Social History Narrative  . No narrative on file    ROS ALL NEGATIVE  EXCEPT THOSE NOTED IN HPI  PE  General Appearance: well developed, well nourished in no acute distress HEENT: symmetrical face, PERRLA, good dentition  Neck: no JVD, thyromegaly, or adenopathy, trachea midline Chest: symmetric without deformity Cardiac: PMI non-displaced, RRR, normal S1, S2, no gallop or murmur Lung: clear to ausculation and percussion Vascular: all pulses full without bruits  Abdominal: nondistended, nontender, good bowel sounds, no HSM, no bruits Extremities: no cyanosis, clubbing , skin thinning with 1+ pitting edema medial side of his left ankle. There is no evidence of cellulitis., no sign of DVT, no varicosities  Skin: normal color, no rashes Neuro: alert and oriented x 3, non-focal Pysch: normal affect  EKG  BMET    Component Value Date/Time   NA 141 06/20/2011 1656   K 5.2* 06/20/2011 1656   CL 100 06/20/2011 1656   CO2 32 06/20/2011 1656   GLUCOSE 155* 06/20/2011 1656   BUN 24* 06/20/2011 1656   CREATININE 1.5 06/20/2011 1656   CREATININE 0.70 11/01/2009 1146   CALCIUM 9.5 06/20/2011 1656   GFRNONAA 88* 04/25/2011 0325   GFRAA >90 04/25/2011 0325    Lipid Panel     Component Value Date/Time   CHOL  Value: 136        ATP III CLASSIFICATION:  <200     mg/dL   Desirable  454-098  mg/dL   Borderline High  >=119    mg/dL   High        1/47/8295 0340   TRIG 158* 09/04/2009 0340   HDL 44 09/04/2009 0340   CHOLHDL 3.1 09/04/2009 0340   VLDL 32 09/04/2009 0340   LDLCALC  Value: 60        Total Cholesterol/HDL:CHD Risk Coronary Heart Disease Risk Table                     Men   Women  1/2 Average Risk   3.4   3.3  Average Risk       5.0   4.4  2 X Average Risk   9.6   7.1  3 X Average Risk  23.4   11.0  Use the calculated Patient Ratio above and the CHD Risk Table to determine the patient's CHD Risk.        ATP III CLASSIFICATION (LDL):  <100     mg/dL   Optimal  409-811  mg/dL   Near or Above                    Optimal  130-159  mg/dL   Borderline  914-782  mg/dL    High  >956     mg/dL   Very High 05/22/863 7846    CBC    Component Value Date/Time   WBC 6.3 04/25/2011 0325   WBC 7.5 08/28/2010 1427   RBC 3.83* 04/25/2011 0325   RBC 3.77* 08/28/2010 1427   HGB 10.3* 04/25/2011 0325   HGB 11.3* 08/28/2010 1427   HCT 32.6* 04/25/2011 0325   HCT 33.5* 08/28/2010 1427   PLT 425* 04/25/2011 0325   PLT 264 08/28/2010 1427   MCV 85.1 04/25/2011 0325   MCV 88.7 08/28/2010 1427   MCH 26.9 04/25/2011 0325   MCH 30.1 08/28/2010 1427   MCHC 31.6 04/25/2011 0325   MCHC 33.9 08/28/2010 1427   RDW 16.3* 04/25/2011 0325   RDW 16.8* 08/28/2010 1427   LYMPHSABS 1.5 04/24/2011 1738   LYMPHSABS 1.7 08/28/2010 1427   MONOABS 0.5 04/24/2011 1738   MONOABS 0.5 08/28/2010 1427   EOSABS 0.1 04/24/2011 1738   EOSABS 0.2 08/28/2010 1427   BASOSABS 0.0 04/24/2011 1738   BASOSABS 0.0 08/28/2010 1427

## 2011-07-15 NOTE — Assessment & Plan Note (Signed)
His blood pressure last visit was too low. Improved. I think he'll feel better with a pressure in the 140s over 80s. I have asked him to increase his Lasix to 40 mg in the morning and 40 in the evening. This should help with the swelling and pain. He is only taking 40 mg a morning at present. He has appointment with orthopedic surgery with Dr.Bednars concerning the ankle in the near future. I'll see him back again in 3 months.

## 2011-07-15 NOTE — Patient Instructions (Signed)
Your physician recommends that you schedule a follow-up appointment in: 3 MONTHS  Your physician has recommended you make the following change in your medication: LASIX 40 MG TWICE A DAY

## 2011-07-17 IMAGING — CR DG THORACIC SPINE 2V
1 series · 1 of 1 positions shown · non-contrast
Comparison: 07/31/2007 lumbar spine MRI.

CLINICAL DATA: Lumbar spondylosis.  Thoracic spondylosis.

THORACIC SPINE - 2 VIEW

[view not recorded]
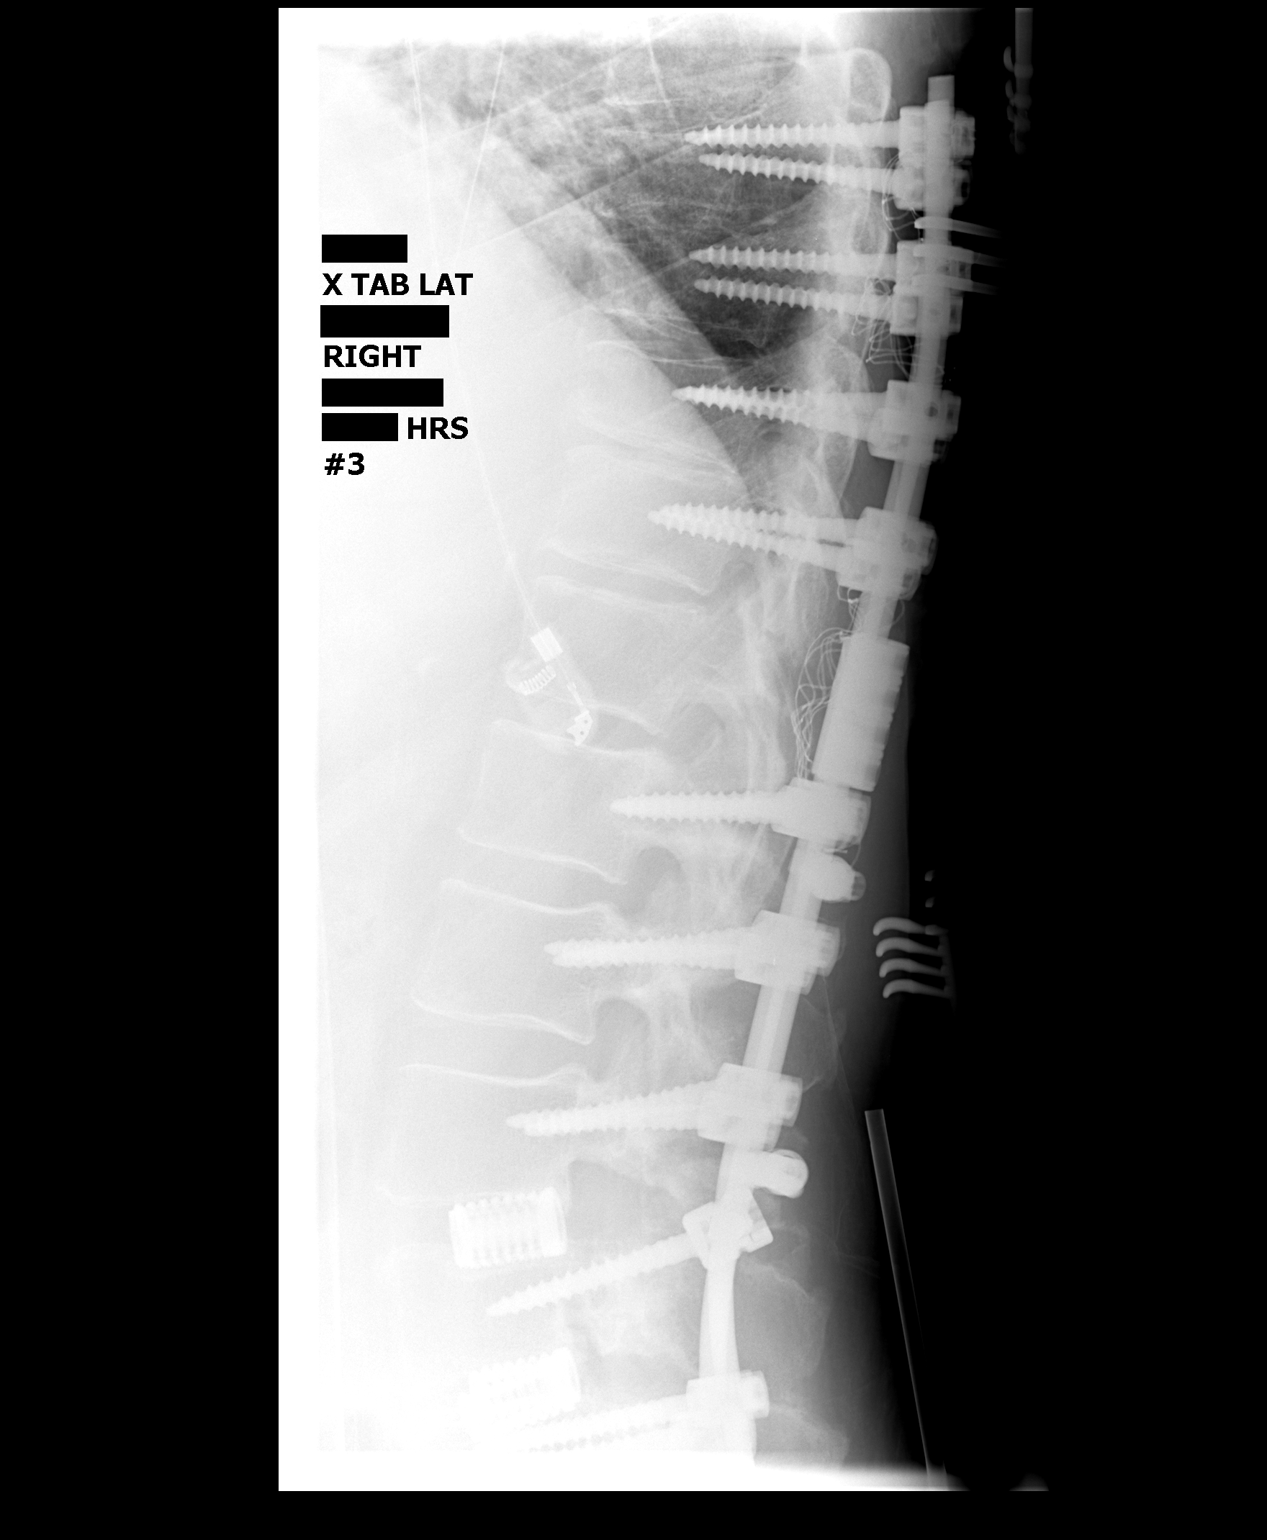

[1 of 1 positions shown; findings below may reference images not displayed]

FINDINGS: Series of three intraoperative cross-table lateral lumbar
spine radiographs demonstrate extension of previously seen
posterior lumbar interbody fusion that extended from L1-L5.  The
extension now extends up to the T7 level.  Lucency is present
around the T12 pedicle screws.
IMPRESSION: Extension of posterior lumbar interbody fusion up to the level of
T7.

## 2011-08-01 ENCOUNTER — Other Ambulatory Visit: Payer: Self-pay | Admitting: Internal Medicine

## 2011-08-01 MED ORDER — TEMAZEPAM 15 MG PO CAPS: 15.0000 mg | ORAL_CAPSULE | Freq: Every evening | ORAL | Status: DC | PRN

## 2011-08-01 NOTE — Telephone Encounter (Signed)
Called to pharmacy 

## 2011-08-21 ENCOUNTER — Ambulatory Visit (INDEPENDENT_AMBULATORY_CARE_PROVIDER_SITE_OTHER): Payer: Medicare Other | Admitting: Infectious Disease

## 2011-08-21 ENCOUNTER — Telehealth: Payer: Self-pay | Admitting: Oncology

## 2011-08-21 ENCOUNTER — Encounter: Payer: Self-pay | Admitting: Infectious Disease

## 2011-08-21 VITALS — BP 149/90 | HR 94 | Temp 98.6°F | Ht 69.0 in | Wt 163.0 lb

## 2011-08-21 DIAGNOSIS — L03119 Cellulitis of unspecified part of limb: Secondary | ICD-10-CM

## 2011-08-21 DIAGNOSIS — L02419 Cutaneous abscess of limb, unspecified: Secondary | ICD-10-CM

## 2011-08-21 NOTE — Assessment & Plan Note (Signed)
Have YET to prove a deeper infection that would require surgical intervention and protracted antibiotics. Will continue to observe off antibiotics. I was disturbed to read about low bp and confusio with low grade temp reported in April. May need to bring him back sooner and consider re-imaging his leg (or hand)

## 2011-08-21 NOTE — Telephone Encounter (Signed)
gv pt appts for 7/1 adn 7/8. MOSAIQ

## 2011-08-21 NOTE — Progress Notes (Signed)
Subjective:    Patient ID: Johnny Patrick, male    DOB: [birth year], 66 y.o.   MRN: [id number]  HPI  66 year old Caucasian male with complicated PMHS including prior cryptococcal infection of arm, psoriatic arthritis (prev on methotrexate and prednisone), prev L THR (02-2008), prev R foot MSSA infection with sepsis 2009. He subsequently developed infection in his L second toe (MRI no osteo) And her underwent amputation 09-08-09. Cx's MSSA. He was then seen Feb 2012 with cellulits of his R hand after a presumed cut to his hand. He completed a course of doxy for this. MRI showed no osteo, abscess or myofasciitis. By 3-13 he had worsening of swelling, erythema in his hand. He was rec to go to the ED which he refused. He got another 20 days of doxy after this then 10 days of bactrim (completed 07-02-10). Was seen in ID clinic 07-11-10 and was started on 30 more days of doxy (he was given choice of PIC or PO). He was seen again in ID clinic May 2012 and was to be continued on doxy to August 2012. (he was seen by Dr. [name] multiple times along this course). Patient developed swelling in his left ankle (where he has a fused joint) ARe became edematous, and exquisitely tender to palpation. He began taking his home doxycycline. He was then seen by Norlene Campbell who saw him and tapped a fluid collection which showed no crystals multiple wbc predominantly PMNS >90%. Cell count was not done. He was switched to doxy and keflex and continued this thru following Monday at which time was re-evaluated by Dr. Cleophas Dunker with no improvement and changed to levaquin and doxycyline. He had MRI done prior to visit with me which showed per Dr. Jena Gauss showed no abscess, no osteomyelitis, only minimal edema in tibia that he feels is c/w arthritis but not septic arthritis and fused joint.. He had ESR markedly elevated above 100 (but could be due to his arthritis). I had him admitted to the hospital where he was placed on IV vancomycin  with rapid improvement in his erythema and pain per my colleague Dr. Luciana Axe who folllowed pt at [facility]. Pt had remaiend on vancomycin since with continued improvement in appearance of his leg. i SAW him in February with plans to observe him off of abx. However in interim he developed ankle swellin and restarted oral doxycline that he took for  2 to 3 weeks. Swelling improved at night. I saw him again in March and recommended observation off antibiotics which he did. Today in clnic he stated that ankle has remained swollen but not become erythematous and he denied fevers. In reviewing epic notes Dr wall had noted confusion with low bp and low grade fevers and had adjusted pts anti-HTNsives in April.  I have wished to observe him off antibiotics until we have a clear cut infection to treat.   Review of Systems  Constitutional: Negative for fever, chills, diaphoresis, activity change, appetite change, fatigue and unexpected weight change.  HENT: Negative for congestion, sore throat, rhinorrhea, sneezing, trouble swallowing and sinus pressure.   Eyes: Negative for photophobia and visual disturbance.  Respiratory: Negative for cough, chest tightness, shortness of breath, wheezing and stridor.   Cardiovascular: Negative for chest pain, palpitations and leg swelling.  Gastrointestinal: Negative for nausea, vomiting, abdominal pain, diarrhea, constipation, blood in stool, abdominal distention and anal bleeding.  Genitourinary: Negative for dysuria, hematuria, flank pain and difficulty urinating.  Musculoskeletal: Positive for joint swelling and gait  problem. Negative for myalgias, back pain and arthralgias.  Skin: Negative for color change, pallor, rash and wound.  Neurological: Negative for dizziness, tremors, weakness and light-headedness.  Hematological: Negative for adenopathy. Does not bruise/bleed easily.  Psychiatric/Behavioral: Negative for behavioral problems, confusion, sleep disturbance, dysphoric  mood, decreased concentration and agitation.       Objective:   Physical Exam  Constitutional: He is oriented to person, place, and time. He appears well-developed and well-nourished. No distress.  HENT:  Head: Normocephalic and atraumatic.  Mouth/Throat: Oropharynx is clear and moist. No oropharyngeal exudate.  Eyes: Conjunctivae and EOM are normal. Pupils are equal, round, and reactive to light. No scleral icterus.  Neck: Normal range of motion. Neck supple. No JVD present.  Cardiovascular: Normal rate, regular rhythm and normal heart sounds.  Exam reveals no gallop and no friction rub.   No murmur heard. Pulmonary/Chest: Effort normal and breath sounds normal. No respiratory distress. He has no wheezes. He has no rales. He exhibits no tenderness.  Abdominal: He exhibits no distension and no mass. There is no tenderness. There is no rebound and no guarding.  Musculoskeletal: He exhibits no edema and no tenderness.       Left ankle: He exhibits swelling and deformity. He exhibits no ecchymosis. no tenderness.       Feet:  Lymphadenopathy:    He has no cervical adenopathy.  Neurological: He is alert and oriented to person, place, and time. He has normal reflexes. He exhibits normal muscle tone. Coordination normal.  Skin: Skin is warm and dry. He is not diaphoretic. There is erythema. No pallor.  Psychiatric: He has a normal mood and affect. His behavior is normal. Judgment and thought content normal.          Assessment & Plan:

## 2011-08-22 LAB — C-REACTIVE PROTEIN: CRP: 4.53 mg/dL — ABNORMAL HIGH (ref <0.60)

## 2011-08-22 LAB — CBC WITH DIFFERENTIAL/PLATELET
Basophils Absolute: 0 10*3/uL (ref 0.0–0.1)
Basophils Relative: 0 % (ref 0–1)
Hemoglobin: 12.2 g/dL — ABNORMAL LOW (ref 13.0–17.0)
Lymphocytes Relative: 17 % (ref 12–46)
MCHC: 29.5 g/dL — ABNORMAL LOW (ref 30.0–36.0)
Monocytes Relative: 7 % (ref 3–12)
Neutro Abs: 6.7 10*3/uL (ref 1.7–7.7)
Neutrophils Relative %: 73 % (ref 43–77)
WBC: 9.2 10*3/uL (ref 4.0–10.5)

## 2011-08-22 LAB — SEDIMENTATION RATE: Sed Rate: 13 mm/hr (ref 0–16)

## 2011-08-22 LAB — BASIC METABOLIC PANEL WITH GFR
BUN: 19 mg/dL (ref 6–23)
Chloride: 99 mEq/L (ref 96–112)
GFR, Est African American: >89 mL/min
GFR, Est Non African American: 81 mL/min
Potassium: 5.1 mEq/L (ref 3.5–5.3)
Sodium: 141 mEq/L (ref 135–145)

## 2011-08-26 ENCOUNTER — Ambulatory Visit: Payer: Medicare Other | Admitting: Internal Medicine

## 2011-09-16 ENCOUNTER — Ambulatory Visit (INDEPENDENT_AMBULATORY_CARE_PROVIDER_SITE_OTHER): Payer: Medicare Other | Admitting: Internal Medicine

## 2011-09-16 ENCOUNTER — Encounter: Payer: Self-pay | Admitting: Internal Medicine

## 2011-09-16 VITALS — BP 140/70 | HR 71 | Ht 66.0 in | Wt 165.2 lb

## 2011-09-16 DIAGNOSIS — G4700 Insomnia, unspecified: Secondary | ICD-10-CM

## 2011-09-16 DIAGNOSIS — J449 Chronic obstructive pulmonary disease, unspecified: Secondary | ICD-10-CM

## 2011-09-16 DIAGNOSIS — F5104 Psychophysiologic insomnia: Secondary | ICD-10-CM

## 2011-09-16 DIAGNOSIS — G4733 Obstructive sleep apnea (adult) (pediatric): Secondary | ICD-10-CM

## 2011-09-16 NOTE — Patient Instructions (Addendum)
Please call as needed 

## 2011-09-16 NOTE — Progress Notes (Signed)
Patient ID: Johnny Patrick, male    DOB: 17-Sep-1945, 66 y.o.   MRN: [id number]  HPI 08/23/10-65 yoM former smoker followed for COPD with hx cryptococcosis, OSA, allergic rhinitis, hx lung nodule, GERD, psoriatic arthritis, degenerative disk disease Last here February 20, 2010 after rods removed from spine. Continues f/u for anemia of chronic disease.  Since last here chronic cough has largely resolved.  Breathing now feels normal w/o cough, wheeze or shortness of breath. He does tire easily and is unsteady with rods out, so uses cart to ride at store. Discussed last CXR 6/ 2011  02/24/11- 65 yoM former smoker followed for COPD with hx cryptococcosis, OSA, allergic rhinitis, hx lung nodule, GERD, psoriatic arthritis, degenerative disk disease He has had flu vaccine. He reports being comfortable with CPAP at 12/[facility] and able to use it all night every night with good control. Since last here he had right hip replacement with no problems on September 25. He describes nonspecific pains across his low back and in the low back muscles which do not affect his breathing. He denies lung or respiratory discomfort and had no respiratory problems with his hip surgery. He was seen by Dr. [name] for infectious disease because of left arm nodules/cryptococcosis. Chest x-ray 08/23/2010-no active cardiopulmonary disease. Removal of thoracic rods, mild compression fractures and kyphosis. He had used his rescue inhaler just before coming today. He has continued to use temazepam for insomnia. Ambien used in hospital worked about the same.  09/16/11-  65 yoM former smoker followed for COPD with hx cryptococcosis, OSA, allergic rhinitis, hx lung nodule, GERD, psoriatic arthritis, degenerative disk disease  Denies sob, cough, congestion, and wheezing His COPD assessment test (CAT) score today is 4/40 He denies active shortness of breath or wheeze given his very limited exercise capability. He had a  cellulitis in his left foot requiring intravenous Augmentin through a PICC line. Cryptococcosis infection on the right arm resolved under Dr. Moshe Cipro care. Obstructive sleep apnea-he continues CPAP 12/[facility] and says he is able to use it all night every night and that it helps his sleep. Chronic difficulty initiating and maintaining sleep/insomnia does well with occasional temazepam.   Review of Systems-see HPI Constitutional:   No-   weight loss, night sweats, fevers, chills, fatigue, lassitude. HEENT:   No-  headaches, difficulty swallowing, tooth/dental problems, sore throat,       No-  sneezing, itching, ear ache, nasal congestion, post nasal drip,  CV:  No-   chest pain, orthopnea, PND, swelling in lower extremities, anasarca, dizziness, palpitations Resp: No-   shortness of breath with his limited exertion or at rest.              No-   productive cough,  No non-productive cough,  No- coughing up of blood.              No-   change in color of mucus.  No- wheezing.   Skin: No-   rash or lesions. GI:  No-   heartburn, indigestion, abdominal pain, nausea, vomiting,  GU:  MS:  No-significant change in   joint pain or swelling, decreased range of motion.  +- back pain. Neuro-     nothing unusual Psych:  No- change in mood or affect. No depression or anxiety.  No memory loss.  Objective:   Physical Exam General- Alert, Oriented, Affect-appropriate, Distress- none acute Skin- rash-none, lesions- none, excoriation- none Lymphadenopathy- none Head- atraumatic  Eyes- Gross vision intact, PERRLA, conjunctivae clear secretions            Ears- Hearing, canals-normal            Nose- Clear, no-Septal dev, mucus, polyps, erosion, perforation             Throat- Mallampati II, mucosa clear, no- post-nasal drip, tonsils- atrophic Neck- flexible , trachea midline, no stridor , thyroid nl, carotid no bruit Chest - symmetrical excursion , unlabored           Heart/CV- RRR  , no murmur , no gallop  , no rub, nl s1 s2                           - JVD- none , edema- none, stasis changes- none, varices- none           Lung-  unlabored, coarse but clear., wheeze- none, cough- none , dullness-none, rub- none           Chest wall-  Abd- Br/ Gen/ Rectal- Not done, not indicated Extrem- cyanosis- none, clubbing, none, atrophy- none, strength-not assessed. Cane, Left walking boot. Neuro- grossly intact to observation

## 2011-09-21 ENCOUNTER — Other Ambulatory Visit: Payer: Self-pay | Admitting: Cardiology

## 2011-09-21 NOTE — Assessment & Plan Note (Signed)
Continued good compliance and control 

## 2011-09-21 NOTE — Assessment & Plan Note (Signed)
Satisfactory control as indicated by his CAT score. No changes to suggest.

## 2011-09-21 NOTE — Assessment & Plan Note (Signed)
Basics of good sleep hygiene were reviewed. Temazepam has been an appropriate medication for insomnia.

## 2011-10-07 ENCOUNTER — Other Ambulatory Visit: Payer: Self-pay | Admitting: Oncology

## 2011-10-07 ENCOUNTER — Other Ambulatory Visit: Payer: Medicare Other | Admitting: Lab

## 2011-10-07 LAB — CBC & DIFF AND RETIC
Basophils Absolute: 0 10*3/uL (ref 0.0–0.1)
Eosinophils Absolute: 0.2 10*3/uL (ref 0.0–0.5)
HGB: 10.7 g/dL — ABNORMAL LOW (ref 13.0–17.1)
MONO#: 0.5 10*3/uL (ref 0.1–0.9)
NEUT#: 6.3 10*3/uL (ref 1.5–6.5)
Platelets: 224 10*3/uL (ref 140–400)
RBC: 3.84 10*6/uL — ABNORMAL LOW (ref 4.20–5.82)
RDW: 16.4 % — ABNORMAL HIGH (ref 11.0–14.6)
Retic %: 1.15 % (ref 0.80–1.80)
Retic Ct Abs: 44.16 10*3/uL (ref 34.80–93.90)
WBC: 8.1 10*3/uL (ref 4.0–10.3)

## 2011-10-07 LAB — IRON AND TIBC
Iron: 17 ug/dL — ABNORMAL LOW (ref 42–165)
UIBC: 231 ug/dL (ref 125–400)

## 2011-10-14 ENCOUNTER — Encounter: Payer: Self-pay | Admitting: Oncology

## 2011-10-14 ENCOUNTER — Telehealth: Payer: Self-pay | Admitting: Oncology

## 2011-10-14 ENCOUNTER — Encounter: Payer: Self-pay | Admitting: Cardiology

## 2011-10-14 ENCOUNTER — Ambulatory Visit (HOSPITAL_BASED_OUTPATIENT_CLINIC_OR_DEPARTMENT_OTHER): Payer: Medicare Other | Admitting: Oncology

## 2011-10-14 ENCOUNTER — Ambulatory Visit (INDEPENDENT_AMBULATORY_CARE_PROVIDER_SITE_OTHER): Payer: Medicare Other | Admitting: Cardiology

## 2011-10-14 VITALS — BP 161/89 | HR 89 | Temp 97.8°F | Ht 66.0 in | Wt 159.7 lb

## 2011-10-14 VITALS — BP 147/82 | HR 74 | Ht 66.0 in | Wt 159.1 lb

## 2011-10-14 DIAGNOSIS — J449 Chronic obstructive pulmonary disease, unspecified: Secondary | ICD-10-CM

## 2011-10-14 DIAGNOSIS — M069 Rheumatoid arthritis, unspecified: Secondary | ICD-10-CM | POA: Insufficient documentation

## 2011-10-14 DIAGNOSIS — R600 Localized edema: Secondary | ICD-10-CM

## 2011-10-14 DIAGNOSIS — L4050 Arthropathic psoriasis, unspecified: Secondary | ICD-10-CM | POA: Insufficient documentation

## 2011-10-14 DIAGNOSIS — D638 Anemia in other chronic diseases classified elsewhere: Secondary | ICD-10-CM | POA: Insufficient documentation

## 2011-10-14 DIAGNOSIS — G4733 Obstructive sleep apnea (adult) (pediatric): Secondary | ICD-10-CM

## 2011-10-14 DIAGNOSIS — R609 Edema, unspecified: Secondary | ICD-10-CM

## 2011-10-14 DIAGNOSIS — I10 Essential (primary) hypertension: Secondary | ICD-10-CM

## 2011-10-14 HISTORY — DX: Anemia in other chronic diseases classified elsewhere: D63.8

## 2011-10-14 HISTORY — DX: Arthropathic psoriasis, unspecified: L40.50

## 2011-10-14 NOTE — Progress Notes (Signed)
HPI Johnny Patrick returns today for evaluation of his difficult to control hypertension and lower extremity edema.  Since we cut back on his losartan, he has had no further dizzy spells or confusion. He has he's out working in his garden for the first time in a while. He denies any increased swelling or edema. He denies any significant change in his dyspnea on exertion.  Past Medical History  Diagnosis Date  . Chronic airway obstruction, not elsewhere classified   . Esophageal reflux   . Unspecified essential hypertension   . Anemia   . Hypothyroidism   . Osteoarthritis   . Nephrolithiasis   . Psoriatic arthritis   . Low back pain   . 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 MSSA (methicillin susceptible Staphylococcus aureus) septicemia     L 2nd toe-amputated June 2011  . Shingles     x2  . MRSA (methicillin resistant Staphylococcus aureus)   . Sleep apnea   . Anemia, chronic disease 10/14/2011  . Psoriatic arthritis 10/14/2011    Current Outpatient Prescriptions  Medication Sig Dispense Refill  . Ascorbic Acid (VITAMIN C) 500 MG CHEW Chew 500 mg by mouth daily.        . budesonide-formoterol (SYMBICORT) 160-4.5 MCG/ACT inhaler Inhale 2 puffs into the lungs 2 (two) times daily.  1 Inhaler  2  . calcium carbonate (CALCIUM 600) 600 MG TABS Take 600 mg by mouth 3 (three) times daily.        . carvedilol (COREG) 25 MG tablet TAKE ONE TABLET TWICE DAILY  60 tablet  12  . Cholecalciferol 400 UNITS CAPS Take 1 capsule by mouth daily.       . cyclobenzaprine (FLEXERIL) 10 MG tablet Take 10 mg by mouth every 8 (eight) hours as needed. For muscle pain.      . fentaNYL (DURAGESIC - DOSED MCG/HR) 100 MCG/HR Place 1 patch onto the skin every 3 (three) days.        . folic acid (FOLVITE) 1 MG tablet Take 1 mg by mouth daily.        . furosemide (LASIX) 40 MG tablet Take 1 tablet (40 mg total) by mouth 2 (two) times daily.  60 tablet  12  . gabapentin (NEURONTIN) 100 MG capsule Take 2 capsules (200 mg  total) by mouth 3 (three) times daily.  180 capsule  3  . HYDROmorphone (DILAUDID) 2 MG tablet Take 2 mg by mouth every 4 (four) hours.        Marland Kitchen leflunomide (ARAVA) 20 MG tablet Take 30 mg by mouth daily.       Marland Kitchen levothyroxine (SYNTHROID, LEVOTHROID) 150 MCG tablet Take 150 mcg by mouth daily.        Marland Kitchen losartan (COZAAR) 50 MG tablet Take 1 tablet (50 mg total) by mouth daily.  30 tablet  6  . Multiple Vitamin (MULITIVITAMIN WITH MINERALS) TABS Take 1 tablet by mouth daily.      . Omega-3 Fatty Acids (FISH OIL) 1000 MG CAPS Take 1 capsule by mouth 3 (three) times daily.       Marland Kitchen oxyCODONE-acetaminophen (PERCOCET) 5-325 MG per tablet Take 1 tablet by mouth every 4 (four) hours as needed. For pain.      . potassium chloride SA (KLOR-CON M20) 20 MEQ tablet Take 20 mEq by mouth daily.       . predniSONE (DELTASONE) 5 MG tablet Take 5 mg by mouth daily.        . temazepam (  RESTORIL) 15 MG capsule Take 1-2 capsules (15-30 mg total) by mouth at bedtime as needed. For sleep.  68 capsule  5    Allergies  Allergen Reactions  . Amoxicillin-Pot Clavulanate   . Sulfur Rash    Family History  Problem Relation Age of Onset  . Coronary artery disease Mother   . Pneumonia Mother   . Heart failure Mother   . Breast cancer Mother   . Psoriasis Father   . Coronary artery disease Brother     History   Social History  . Marital Status: Married    Spouse Name: N/A    Number of Children: N/A  . Years of Education: N/A   Occupational History  . retired    Social History Main Topics  . Smoking status: Former Smoker    Types: Cigarettes    Quit date: 04/24/1991  . Smokeless tobacco: Never Used  . Alcohol Use: No  . Drug Use: No  . Sexually Active: Not on file   Other Topics Concern  . Not on file   Social History Narrative  . No narrative on file    ROS ALL NEGATIVE EXCEPT THOSE NOTED IN HPI  PE  General Appearance: well developed, well nourished in no acute distress HEENT:  symmetrical face, PERRLA, good dentition  Neck: no JVD, thyromegaly, or adenopathy, trachea midline Chest: symmetric without deformity Cardiac: PMI non-displaced, RRR, normal S1, S2, no gallop or murmur Lung: clear to ausculation and percussion Vascular: all pulses full without bruits  Abdominal: nondistended, nontender, good bowel sounds, no HSM, no bruits Extremities: no cyanosis, clubbing, chronic edema left ankle., no sign of DVT, no varicosities  Skin: normal color, no rashes Neuro: alert and oriented x 3, non-focal Pysch: normal affect  EKG Normal sinus rhythm, left axis deviation. BMET    Component Value Date/Time   NA 141 08/21/2011 1519   K 5.1 08/21/2011 1519   CL 99 08/21/2011 1519   CO2 31 08/21/2011 1519   GLUCOSE 159* 08/21/2011 1519   BUN 19 08/21/2011 1519   CREATININE 0.96 08/21/2011 1519   CREATININE 1.5 06/20/2011 1656   CREATININE 0.70 11/01/2009 1146   CALCIUM 9.7 08/21/2011 1519   GFRNONAA 88* 04/25/2011 0325   GFRAA >90 04/25/2011 0325    Lipid Panel     Component Value Date/Time   CHOL  Value: 136        ATP III CLASSIFICATION:  <200     mg/dL   Desirable  621-308  mg/dL   Borderline High  >=657    mg/dL   High        8/46/9629 0340   TRIG 158* 09/04/2009 0340   HDL 44 09/04/2009 0340   CHOLHDL 3.1 09/04/2009 0340   VLDL 32 09/04/2009 0340   LDLCALC  Value: 60        Total Cholesterol/HDL:CHD Risk Coronary Heart Disease Risk Table                     Men   Women  1/2 Average Risk   3.4   3.3  Average Risk       5.0   4.4  2 X Average Risk   9.6   7.1  3 X Average Risk  23.4   11.0        Use the calculated Patient Ratio above and the CHD Risk Table to determine the patient's CHD Risk.        ATP III CLASSIFICATION (LDL):  <  100     mg/dL   Optimal  161-096  mg/dL   Near or Above                    Optimal  130-159  mg/dL   Borderline  045-409  mg/dL   High  >811     mg/dL   Very High 12/21/7827 5621    CBC    Component Value Date/Time   WBC 8.1 10/07/2011 1419   WBC  9.2 08/21/2011 1519   RBC 3.84* 10/07/2011 1419   RBC 4.48 08/21/2011 1519   HGB 10.7* 10/07/2011 1419   HGB 12.2* 08/21/2011 1519   HCT 32.7* 10/07/2011 1419   HCT 41.4 08/21/2011 1519   PLT 224 10/07/2011 1419   PLT 349 08/21/2011 1519   MCV 85.2 10/07/2011 1419   MCV 92.4 08/21/2011 1519   MCH 27.9 10/07/2011 1419   MCH 27.2 08/21/2011 1519   MCHC 32.7 10/07/2011 1419   MCHC 29.5* 08/21/2011 1519   RDW 16.4* 10/07/2011 1419   RDW 18.9* 08/21/2011 1519   LYMPHSABS 1.2 10/07/2011 1419   LYMPHSABS 1.6 08/21/2011 1519   MONOABS 0.5 10/07/2011 1419   MONOABS 0.6 08/21/2011 1519   EOSABS 0.2 10/07/2011 1419   EOSABS 0.2 08/21/2011 1519   BASOSABS 0.0 10/07/2011 1419   BASOSABS 0.0 08/21/2011 1519

## 2011-10-14 NOTE — Patient Instructions (Addendum)
Your physician recommends that you continue on your current medications as directed. Please refer to the Current Medication list given to you today.  Your physician wants you to follow-up in: 6 months. You will receive a reminder letter in the mail two months in advance. If you don't receive a letter, please call our office to schedule the follow-up appointment.  
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
## 2011-10-14 NOTE — Assessment & Plan Note (Signed)
Stable. No change in diuretic.

## 2011-10-14 NOTE — Assessment & Plan Note (Signed)
Stable. He is having no further symptoms of dizziness or confusion since lowering his dose of losarten. He looks the best I've seen him in some time.

## 2011-10-14 NOTE — Telephone Encounter (Signed)
appts made and printed 

## 2011-10-16 ENCOUNTER — Ambulatory Visit: Payer: Medicare Other | Admitting: Infectious Disease

## 2011-12-19 ENCOUNTER — Ambulatory Visit: Payer: Medicare Other | Admitting: Infectious Disease

## 2012-01-22 ENCOUNTER — Ambulatory Visit (INDEPENDENT_AMBULATORY_CARE_PROVIDER_SITE_OTHER): Payer: Medicare Other | Admitting: Infectious Disease

## 2012-01-22 ENCOUNTER — Encounter: Payer: Self-pay | Admitting: Infectious Disease

## 2012-01-22 VITALS — BP 148/80 | HR 78 | Temp 98.2°F | Ht 66.0 in | Wt 153.2 lb

## 2012-01-22 DIAGNOSIS — M25472 Effusion, left ankle: Secondary | ICD-10-CM | POA: Insufficient documentation

## 2012-01-22 DIAGNOSIS — M25473 Effusion, unspecified ankle: Secondary | ICD-10-CM

## 2012-01-22 LAB — CBC WITH DIFFERENTIAL/PLATELET
Eosinophils Absolute: 0.2 10*3/uL (ref 0.0–0.7)
Eosinophils Relative: 2 % (ref 0–5)
HCT: 33.1 % — ABNORMAL LOW (ref 39.0–52.0)
Hemoglobin: 10.5 g/dL — ABNORMAL LOW (ref 13.0–17.0)
Lymphs Abs: 1.5 10*3/uL (ref 0.7–4.0)
MCH: 26.3 pg (ref 26.0–34.0)
MCV: 82.8 fL (ref 78.0–100.0)
Monocytes Relative: 6 % (ref 3–12)
Neutrophils Relative %: 74 % (ref 43–77)
RBC: 4 MIL/uL — ABNORMAL LOW (ref 4.22–5.81)

## 2012-01-22 NOTE — Progress Notes (Signed)
Subjective:    Patient ID: Johnny Patrick, male    DOB: 23-May-1945, 66 y.o.   MRN: [id number]  HPI  66 year old Caucasian male with complicated PMHS including prior cryptococcal infection of arm, psoriatic arthritis (prev on methotrexate and prednisone), prev L THR (02-2008), prev R foot MSSA infection with sepsis 2009. He subsequently developed infection in his L second toe (MRI no osteo) And her underwent amputation 09-08-09. Cx's MSSA. He was then seen Feb 2012 with cellulits of his R hand after a presumed cut to his hand. He completed a course of doxy for this. MRI showed no osteo, abscess or myofasciitis. By 3-13 he had worsening of swelling, erythema in his hand. He was rec to go to the ED which he refused. He got another 20 days of doxy after this then 10 days of bactrim (completed 07-02-10). Was seen in ID clinic 07-11-10 and was started on 30 more days of doxy (he was given choice of PIC or PO). He was seen again in ID clinic May 2012 and was to be continued on doxy to August 2012. (he was seen by Dr. [name] multiple times along this course). Patient developed swelling in his left ankle (where he has a fused joint) ARe became edematous, and exquisitely tender to palpation. He began taking his home doxycycline. He was then seen by Norlene Campbell who saw him and tapped a fluid collection which showed no crystals multiple wbc predominantly PMNS >90%. Cell count was not done. He was switched to doxy and keflex and continued this thru following Monday at which time was re-evaluated by Dr. Cleophas Dunker with no improvement and changed to levaquin and doxycyline. He had MRI done prior to visit with me which showed per Dr. Jena Gauss showed no abscess, no osteomyelitis, only minimal edema in tibia that he feels is c/w arthritis but not septic arthritis and fused joint.. He had ESR markedly elevated above 100 (but could be due to his arthritis). I had him admitted to the hospital where he was placed on IV vancomycin  with rapid improvement in his erythema and pain per my colleague Dr. Luciana Axe who folllowed pt at [facility]. Pt had remaiend on vancomycin since with continued improvement in appearance of his leg. i SAW him in February with plans to observe him off of abx. However in interim he developed ankle swellin and restarted oral doxycline that he took for 2 to 3 weeks. Swelling improved at night. I saw him again in March and recommended observation off antibiotics which he did.  In reviewing epic notes Dr wall had noted confusion with low bp and low grade fevers and had adjusted pts anti-HTNsives in April. I have continued to have him OFF antibiotics and he continues to have stability in his erythema and ankle edema which worwen during the day and improve at night. No fevers, nausea, malaise, vomiting or systemic symptoms.  Review of Systems  Constitutional: Negative for fever, chills, diaphoresis, activity change, appetite change, fatigue and unexpected weight change.  HENT: Negative for congestion, sore throat, rhinorrhea, sneezing, trouble swallowing and sinus pressure.   Eyes: Negative for photophobia and visual disturbance.  Respiratory: Negative for cough, chest tightness, shortness of breath, wheezing and stridor.   Cardiovascular: Negative for chest pain, palpitations and leg swelling.  Gastrointestinal: Negative for nausea, vomiting, abdominal pain, diarrhea, constipation, blood in stool, abdominal distention and anal bleeding.  Genitourinary: Negative for dysuria, hematuria, flank pain and difficulty urinating.  Musculoskeletal: Positive for joint swelling and arthralgias.  Negative for myalgias, back pain and gait problem.  Skin: Negative for color change, pallor, rash and wound.  Neurological: Negative for dizziness, tremors, weakness and light-headedness.  Hematological: Negative for adenopathy. Does not bruise/bleed easily.  Psychiatric/Behavioral: Negative for behavioral problems, confusion, disturbed  wake/sleep cycle, dysphoric mood, decreased concentration and agitation.       Objective:   Physical Exam  Constitutional: He is oriented to person, place, and time. He appears well-developed and well-nourished. No distress.  HENT:  Head: Normocephalic and atraumatic.  Mouth/Throat: Oropharynx is clear and moist.  Eyes: Conjunctivae normal and EOM are normal. Pupils are equal, round, and reactive to light.  Neck: Normal range of motion. Neck supple.  Cardiovascular: Normal rate, regular rhythm and normal heart sounds.   Pulmonary/Chest: Effort normal. No respiratory distress.  Abdominal: He exhibits no distension.  Musculoskeletal: He exhibits edema. He exhibits no tenderness.       Feet:  Neurological: He is alert and oriented to person, place, and time. He exhibits normal muscle tone. Coordination normal.  Skin: Skin is warm and dry. He is not diaphoretic. There is erythema. No pallor.  Psychiatric: He has a normal mood and affect. His behavior is normal. Judgment and thought content normal.          Assessment & Plan:  1) Chronic Left ankle edema and erythema: WE have NEVER proven deep infection which should have declared itself by now (though I will not rule out a smoldering osteomyelitis it seems unlikely) he likely  has chronic venous stasis changes and damage to lymphatics. We will check esr, crp and basic labs and bring him back in 6 months  2) cryptococcal infection no evidence or recurrence.

## 2012-01-23 LAB — BASIC METABOLIC PANEL WITH GFR
CO2: 32 mEq/L (ref 19–32)
Chloride: 99 mEq/L (ref 96–112)
Glucose, Bld: 107 mg/dL — ABNORMAL HIGH (ref 70–99)
Potassium: 5.4 mEq/L — ABNORMAL HIGH (ref 3.5–5.3)
Sodium: 140 mEq/L (ref 135–145)

## 2012-01-29 ENCOUNTER — Telehealth: Payer: Self-pay | Admitting: Internal Medicine

## 2012-01-29 MED ORDER — TEMAZEPAM 15 MG PO CAPS: 15.0000 mg | ORAL_CAPSULE | Freq: Every evening | ORAL | Status: DC | PRN

## 2012-01-29 NOTE — Telephone Encounter (Signed)
RX called to the pharmacy and the pharmacist is now aware that responses will be faster if the request is e-scribed. They will change this in their system and future requests for Dr. Maple Hudson will be e-scribed.

## 2012-01-29 NOTE — Telephone Encounter (Signed)
Pt called back as well re: same. Needs this refilled today. Pt # [id number]. Johnny Patrick

## 2012-02-03 ENCOUNTER — Other Ambulatory Visit: Payer: Self-pay | Admitting: Internal Medicine

## 2012-02-03 ENCOUNTER — Other Ambulatory Visit: Payer: Self-pay | Admitting: Cardiology

## 2012-02-03 MED ORDER — LOSARTAN POTASSIUM 50 MG PO TABS: 50.0000 mg | ORAL_TABLET | Freq: Every day | ORAL | Status: DC

## 2012-02-24 ENCOUNTER — Other Ambulatory Visit: Payer: Self-pay | Admitting: Cardiology

## 2012-02-24 MED ORDER — GABAPENTIN 100 MG PO CAPS: 200.0000 mg | ORAL_CAPSULE | Freq: Three times a day (TID) | ORAL | Status: DC

## 2012-02-24 NOTE — Telephone Encounter (Signed)
PT STATES PHARMACY HAS BEEN TRYING SINCE LAST WEEK TO GET FILLED

## 2012-02-24 NOTE — Telephone Encounter (Signed)
rx sent to pharmacy by e-script, per pt advised during phone conversation that MD Wall prescribed the medication originally per this medication will assist in lowering pt blood pressure,

## 2012-02-25 ENCOUNTER — Encounter: Payer: Self-pay | Admitting: Internal Medicine

## 2012-03-17 ENCOUNTER — Ambulatory Visit: Payer: Medicare Other | Admitting: Internal Medicine

## 2012-03-27 ENCOUNTER — Ambulatory Visit (INDEPENDENT_AMBULATORY_CARE_PROVIDER_SITE_OTHER): Payer: Medicare Other | Admitting: Internal Medicine

## 2012-03-27 ENCOUNTER — Ambulatory Visit (AMBULATORY_SURGERY_CENTER): Payer: Medicare Other | Admitting: *Deleted

## 2012-03-27 ENCOUNTER — Encounter: Payer: Self-pay | Admitting: Internal Medicine

## 2012-03-27 ENCOUNTER — Telehealth: Payer: Self-pay | Admitting: *Deleted

## 2012-03-27 VITALS — Ht 66.0 in | Wt 154.0 lb

## 2012-03-27 VITALS — BP 108/70 | HR 84 | Ht 66.0 in | Wt 155.8 lb

## 2012-03-27 DIAGNOSIS — Z1211 Encounter for screening for malignant neoplasm of colon: Secondary | ICD-10-CM

## 2012-03-27 DIAGNOSIS — G4733 Obstructive sleep apnea (adult) (pediatric): Secondary | ICD-10-CM

## 2012-03-27 DIAGNOSIS — J449 Chronic obstructive pulmonary disease, unspecified: Secondary | ICD-10-CM

## 2012-03-27 DIAGNOSIS — J4489 Other specified chronic obstructive pulmonary disease: Secondary | ICD-10-CM

## 2012-03-27 DIAGNOSIS — D638 Anemia in other chronic diseases classified elsewhere: Secondary | ICD-10-CM

## 2012-03-27 MED ORDER — TEMAZEPAM 15 MG PO CAPS: 15.0000 mg | ORAL_CAPSULE | Freq: Every evening | ORAL | Status: DC | PRN

## 2012-03-27 MED ORDER — MOVIPREP 100 G PO SOLR: ORAL | Status: DC

## 2012-03-27 NOTE — Telephone Encounter (Signed)
Dr. Juanda Chance:  This pt is scheduled for direct screening colonoscopy 04/15/12.  You have not seen this patient before.  Last colonoscopy was at least 11 years ago with Dr. Virginia Rochester.  Pt has complicated medical history.  Would you review pt's chart and see if you want this pt to have an office visit before scheduling a colonoscopy or is he okay for direct?  I have also sent note to Cathlyn Parsons to review chart also.  Thanks, Olegario Messier in [facility]

## 2012-03-27 NOTE — Progress Notes (Signed)
Patient ID: Johnny Patrick, male    DOB: 1946-02-02, 66 y.o.   MRN: [id number]  HPI 08/23/10-66 yoM former smoker followed for COPD with hx cryptococcosis, OSA, allergic rhinitis, hx lung nodule, GERD, psoriatic arthritis, degenerative disk disease Last here February 20, 2010 after rods removed from spine. Continues f/u for anemia of chronic disease.  Since last here chronic cough has largely resolved.  Breathing now feels normal w/o cough, wheeze or shortness of breath. He does tire easily and is unsteady with rods out, so uses cart to ride at store. Discussed last CXR 6/ 2011  02/24/11- 66 yoM former smoker followed for COPD with hx cryptococcosis, OSA, allergic rhinitis, hx lung nodule, GERD, psoriatic arthritis, degenerative disk disease He has had flu vaccine. He reports being comfortable with CPAP at 12/[facility] and able to use it all night every night with good control. Since last here he had right hip replacement with no problems on September 25. He describes nonspecific pains across his low back and in the low back muscles which do not affect his breathing. He denies lung or respiratory discomfort and had no respiratory problems with his hip surgery. He was seen by Dr. [name] for infectious disease because of left arm nodules/cryptococcosis. Chest x-ray 08/23/2010-no active cardiopulmonary disease. Removal of thoracic rods, mild compression fractures and kyphosis. He had used his rescue inhaler just before coming today. He has continued to use temazepam for insomnia. Ambien used in hospital worked about the same.  09/16/11-  66 yoM former smoker followed for COPD with hx cryptococcosis, OSA, allergic rhinitis, hx lung nodule, GERD, psoriatic arthritis, degenerative disk disease  Denies sob, cough, congestion, and wheezing His COPD assessment test (CAT) score today is 4/40 He denies active shortness of breath or wheeze given his very limited exercise capability. He had a  cellulitis in his left foot requiring intravenous Augmentin through a PICC line. Cryptococcosis infection on the right arm resolved under Dr. Moshe Cipro care. Obstructive sleep apnea-he continues CPAP 12/[facility] and says he is able to use it all night every night and that it helps his sleep. Chronic difficulty initiating and maintaining sleep/insomnia does well with occasional temazepam.  03/27/12- 66 yoM former smoker followed for COPD with hx cryptococcosis, OSA, allergic rhinitis, hx lung nodule, GERD, psoriatic arthritis, degenerative disk disease FOLLOWS [id number] CPAP12/ [facility] some nights-about 1 time a month. He has not felt the need since he lost weight for years ago. Family does not tell him he snores. He admits an occasional nap but does not fight sleepiness. COPD has been symptomatically stable. Rare wheeze. He is curious about advertisements for Spiriva which we discussed. We discussed anemia as a basis for dyspnea. His hemoglobin stays around 11/ [name]. CXR 6/13 /12 IMPRESSION:  1. No acute cardiopulmonary disease.  2. Removal of thoracic posterior rod and pedicle screw fixation  apparatus.  3. Mid thoracic compression fractures and exaggerated thoracic  kyphosis.  Original Report Authenticated By: Andreas Newport, M.D.   Review of Systems-see HPI Constitutional:   No-   weight loss, night sweats, fevers, chills, fatigue, lassitude. HEENT:   No-  headaches, difficulty swallowing, tooth/dental problems, sore throat,       No-  sneezing, itching, ear ache, nasal congestion, post nasal drip,  CV:  No-   chest pain, orthopnea, PND, swelling in lower extremities, anasarca, dizziness, palpitations Resp: No- acute shortness of breath with his limited exertion or at rest.  No-   productive cough,  No non-productive cough,  No- coughing up of blood.              No-   change in color of mucus.  Little wheezing.   Skin: No-   rash or  lesions. GI:  No-   heartburn, indigestion, abdominal pain, nausea, vomiting,  GU:  MS:  No-significant change in  joint pain or swelling, decreased range of motion.  +- back pain. Neuro-     nothing unusual Psych:  No- change in mood or affect. No depression or anxiety.  No memory loss.  Objective:   Physical Exam General- Alert, Oriented, Affect-appropriate, Distress- none acute. Chronically ill-appearing man  Skin- rash-none, lesions- none, excoriation- none Lymphadenopathy- none Head- atraumatic            Eyes- Gross vision intact, PERRLA, conjunctivae clear secretions            Ears- Hearing, canals-normal            Nose- Clear, no-Septal dev, mucus, polyps, erosion, perforation              Throat- Mallampati II, mucosa clear, no- post-nasal drip, tonsils- atrophic Neck- flexible , trachea midline, no stridor , thyroid nl, carotid no bruit Chest - symmetrical excursion , unlabored           Heart/CV- RRR , no murmur , no gallop  , no rub, nl s1 s2                           - JVD- none , edema- none, stasis changes- none, varices- none           Lung-  unlabored,  clear., wheeze- none, cough- none , dullness-none, rub- none           Chest wall-  Abd- Br/ Gen/ Rectal- Not done, not indicated Extrem- cyanosis- none, clubbing, none, atrophy- none, strength-not assessed. Cane,  Neuro- grossly intact to observation

## 2012-03-27 NOTE — Patient Instructions (Addendum)
Script refilling temazepam. Avoid excessive sedation.  Sample Spiriva - try 1 daily instead of Symbicort. When the Spiriva sample runs out, then return to Symbicort. See which helps breathing the most.   You can drop off CPAP if you are sleeping ok and not snoring.

## 2012-03-28 NOTE — Telephone Encounter (Signed)
I have reviewed pt's chart with respect to risk factors for direct colonoscopy. He may be done without me seeing him in the office. Normal cardiac function ( HBP), COPD without )2 dependence, uses CPAP, No anticoagulants. Colon as scheduled. DB

## 2012-03-30 ENCOUNTER — Encounter: Payer: Self-pay | Admitting: *Deleted

## 2012-03-30 NOTE — Telephone Encounter (Signed)
Notified pt okay to proceed with colonoscopy as scheduled.

## 2012-04-03 NOTE — Telephone Encounter (Signed)
error 

## 2012-04-07 NOTE — Assessment & Plan Note (Signed)
Hemoglobin holding around 11

## 2012-04-07 NOTE — Assessment & Plan Note (Signed)
We will try changing Symbicort to Spiriva as discussed.

## 2012-04-07 NOTE — Assessment & Plan Note (Signed)
He has been noncompliant with CPAP and it may not be clinically important anymore. We discussed alternatives.

## 2012-04-15 ENCOUNTER — Encounter: Payer: Medicare Other | Admitting: Internal Medicine

## 2012-04-27 ENCOUNTER — Ambulatory Visit (INDEPENDENT_AMBULATORY_CARE_PROVIDER_SITE_OTHER): Payer: Medicare Other | Admitting: Cardiology

## 2012-04-27 ENCOUNTER — Encounter: Payer: Self-pay | Admitting: Cardiology

## 2012-04-27 VITALS — BP 140/81 | HR 83 | Ht 66.0 in | Wt 152.0 lb

## 2012-04-27 DIAGNOSIS — R600 Localized edema: Secondary | ICD-10-CM

## 2012-04-27 DIAGNOSIS — I10 Essential (primary) hypertension: Secondary | ICD-10-CM

## 2012-04-27 DIAGNOSIS — R609 Edema, unspecified: Secondary | ICD-10-CM

## 2012-04-27 NOTE — Progress Notes (Signed)
HPI Johnny Patrick returns today for evaluation and management of his difficult to control hypertension and history of lower extremity edema.  He's been doing well from a cardiac standpoint. He denies any chest pain or dyspnea on exertion. He is walking with a cane. He said repeated back surgeries as well as bilateral hip replacements. 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  His edema is nonexistent in his right lower extremity but has chronic edema in the left lower extremity at the ankle from previous trauma.  We have kept his blood pressure in good control for several years now.   Past Medical History  Diagnosis Date  . Chronic airway obstruction, not elsewhere classified   . Esophageal reflux   . Unspecified essential hypertension   . Anemia   . Hypothyroidism   . Osteoarthritis   . Nephrolithiasis   . Psoriatic arthritis   . Low back pain   . 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 MSSA (methicillin susceptible Staphylococcus aureus) septicemia     L 2nd toe-amputated June 2011  . Shingles     x2  . MRSA (methicillin resistant Staphylococcus aureus)   . Sleep apnea     cpap  . Anemia, chronic disease 10/14/2011  . Psoriatic arthritis 10/14/2011    Current Outpatient Prescriptions  Medication Sig Dispense Refill  . Ascorbic Acid (VITAMIN C) 500 MG CHEW Chew 500 mg by mouth daily.        . calcium carbonate (CALCIUM 600) 600 MG TABS Take 600 mg by mouth 3 (three) times daily.        . carvedilol (COREG) 25 MG tablet TAKE ONE TABLET TWICE DAILY  60 tablet  12  . Cholecalciferol 400 UNITS CAPS Take 1 capsule by mouth daily.       . Cinnamon 500 MG capsule Take 500 mg by mouth daily.      . cyclobenzaprine (FLEXERIL) 10 MG tablet Take 10 mg by mouth every 8 (eight) hours as needed. For muscle pain.      . fentaNYL (DURAGESIC - DOSED MCG/HR) 100 MCG/HR Place 1 patch onto the skin every 3 (three) days.        . ferrous fumarate (HEMOCYTE - 106  MG FE) 325 (106 FE) MG TABS Take 1 tablet by mouth 2 (two) times daily.      . folic acid (FOLVITE) 1 MG tablet Take 1 mg by mouth daily.        . furosemide (LASIX) 40 MG tablet Take 1 tablet (40 mg total) by mouth 2 (two) times daily.  60 tablet  12  . gabapentin (NEURONTIN) 100 MG capsule Take 2 capsules (200 mg total) by mouth 3 (three) times daily.  180 capsule  3  . HYDROmorphone (DILAUDID) 2 MG tablet Take 2 mg by mouth every 4 (four) hours.        Marland Kitchen leflunomide (ARAVA) 20 MG tablet Take 30 mg by mouth daily.       Marland Kitchen levothyroxine (SYNTHROID, LEVOTHROID) 150 MCG tablet Take 175 mcg by mouth daily.       Marland Kitchen losartan (COZAAR) 50 MG tablet Take 1 tablet (50 mg total) by mouth daily.  30 tablet  6  . MOVIPREP 100 G SOLR moviprep as directed. No substitutions  1 kit  0  . Multiple Vitamin (MULITIVITAMIN WITH MINERALS) TABS Take 1 tablet by mouth daily.      . Omega-3 Fatty Acids (  FISH OIL) 1000 MG CAPS Take 1 capsule by mouth 3 (three) times daily.       Marland Kitchen oxyCODONE-acetaminophen (PERCOCET) 5-325 MG per tablet Take 1 tablet by mouth every 4 (four) hours as needed. For pain.      . potassium chloride SA (KLOR-CON M20) 20 MEQ tablet Take 20 mEq by mouth daily.       . predniSONE (DELTASONE) 5 MG tablet Take 5 mg by mouth daily.        . SYMBICORT 160-4.5 MCG/ACT inhaler INHALE 2 PUFFS TWICE DAILY  10.2 g  2  . temazepam (RESTORIL) 15 MG capsule Take 1-2 capsules (15-30 mg total) by mouth at bedtime as needed for sleep. For sleep.  60 capsule  5    Allergies  Allergen Reactions  . Amoxicillin-Pot Clavulanate     "liver problems"  . Sulfur Rash    Family History  Problem Relation Age of Onset  . Coronary artery disease Mother   . Pneumonia Mother   . Heart failure Mother   . Breast cancer Mother   . Psoriasis Father   . Coronary artery disease Brother   . Colon cancer Neg Hx   . Stomach cancer Neg Hx     History   Social History  . Marital Status: Married    Spouse Name: N/A     Number of Children: N/A  . Years of Education: N/A   Occupational History  . retired    Social History Main Topics  . Smoking status: Former Smoker    Types: Cigarettes    Quit date: 04/24/1991  . Smokeless tobacco: Never Used  . Alcohol Use: No  . Drug Use: No  . Sexually Active: Not on file   Other Topics Concern  . Not on file   Social History Narrative  . No narrative on file    ROS ALL NEGATIVE EXCEPT THOSE NOTED IN HPI  PE  General Appearance: well developed, well nourished in no acute distress HEENT: symmetrical face, PERRLA, good dentition  Neck: no JVD, thyromegaly, or adenopathy, trachea midline Chest: symmetric without deformity Cardiac: PMI non-displaced, RRR, normal S1, S2, no gallop or murmur Lung: clear to ausculation and percussion Vascular: all pulses full without bruits  Abdominal: nondistended, nontender, good bowel sounds, no HSM, no bruits Extremities: no cyanosis, clubbing, chronic 2+ edema of the left lower leg and ankle region. Pulses are intact no sign of DVT, no varicosities  Skin: normal color, no rashes Neuro: alert and oriented x 3, non-focal Pysch: normal affect  EKG Normal sinus rhythm, no acute changes BMET    Component Value Date/Time   NA 140 01/22/2012 1424   K 5.4* 01/22/2012 1424   CL 99 01/22/2012 1424   CO2 32 01/22/2012 1424   GLUCOSE 107* 01/22/2012 1424   BUN 19 01/22/2012 1424   CREATININE 0.80 01/22/2012 1424   CREATININE 1.5 06/20/2011 1656   CREATININE 0.70 11/01/2009 1146   CALCIUM 9.5 01/22/2012 1424   GFRNONAA 88* 04/25/2011 0325   GFRAA >90 04/25/2011 0325    Lipid Panel     Component Value Date/Time   CHOL  Value: 136        ATP III CLASSIFICATION:  <200     mg/dL   Desirable  119-147  mg/dL   Borderline High  >=829    mg/dL   High        5/62/1308 0340   TRIG 158* 09/04/2009 0340   HDL 44 09/04/2009 0340  CHOLHDL 3.1 09/04/2009 0340   VLDL 32 09/04/2009 0340   LDLCALC  Value: 60        Total  Cholesterol/HDL:CHD Risk Coronary Heart Disease Risk Table                     Men   Women  1/2 Average Risk   3.4   3.3  Average Risk       5.0   4.4  2 X Average Risk   9.6   7.1  3 X Average Risk  23.4   11.0        Use the calculated Patient Ratio above and the CHD Risk Table to determine the patient's CHD Risk.        ATP III CLASSIFICATION (LDL):  <100     mg/dL   Optimal  161-096  mg/dL   Near or Above                    Optimal  130-159  mg/dL   Borderline  045-409  mg/dL   High  >811     mg/dL   Very High 12/21/7827 5621    CBC    Component Value Date/Time   WBC 8.9 01/22/2012 1424   WBC 8.1 10/07/2011 1419   RBC 4.00* 01/22/2012 1424   RBC 3.84* 10/07/2011 1419   HGB 10.5* 01/22/2012 1424   HGB 10.7* 10/07/2011 1419   HCT 33.1* 01/22/2012 1424   HCT 32.7* 10/07/2011 1419   PLT 431* 01/22/2012 1424   PLT 224 10/07/2011 1419   MCV 82.8 01/22/2012 1424   MCV 85.2 10/07/2011 1419   MCH 26.3 01/22/2012 1424   MCH 27.9 10/07/2011 1419   MCHC 31.7 01/22/2012 1424   MCHC 32.7 10/07/2011 1419   RDW 16.8* 01/22/2012 1424   RDW 16.4* 10/07/2011 1419   LYMPHSABS 1.5 01/22/2012 1424   LYMPHSABS 1.2 10/07/2011 1419   MONOABS 0.6 01/22/2012 1424   MONOABS 0.5 10/07/2011 1419   EOSABS 0.2 01/22/2012 1424   EOSABS 0.2 10/07/2011 1419   BASOSABS 0.1 01/22/2012 1424   BASOSABS 0.0 10/07/2011 1419

## 2012-04-27 NOTE — Assessment & Plan Note (Signed)
Stable. No change in medical therapy. Return the office in one year.

## 2012-04-27 NOTE — Assessment & Plan Note (Signed)
Good control. No change in medical therapy. 

## 2012-04-27 NOTE — Patient Instructions (Addendum)
Your physician recommends that you schedule a follow-up appointment in: ONE YEAR WITH TW  Your physician recommends that you continue on your current medications as directed. Please refer to the Current Medication list given to you today.  

## 2012-08-19 ENCOUNTER — Ambulatory Visit (INDEPENDENT_AMBULATORY_CARE_PROVIDER_SITE_OTHER): Payer: Medicare Other | Admitting: Infectious Disease

## 2012-08-19 ENCOUNTER — Encounter: Payer: Self-pay | Admitting: Infectious Disease

## 2012-08-19 VITALS — BP 116/65 | HR 77 | Temp 98.2°F | Ht 66.0 in | Wt 167.0 lb

## 2012-08-19 DIAGNOSIS — M009 Pyogenic arthritis, unspecified: Secondary | ICD-10-CM

## 2012-08-19 DIAGNOSIS — L02419 Cutaneous abscess of limb, unspecified: Secondary | ICD-10-CM

## 2012-08-19 DIAGNOSIS — A4901 Methicillin susceptible Staphylococcus aureus infection, unspecified site: Secondary | ICD-10-CM

## 2012-08-19 DIAGNOSIS — L03119 Cellulitis of unspecified part of limb: Secondary | ICD-10-CM

## 2012-08-19 DIAGNOSIS — L8992 Pressure ulcer of unspecified site, stage 2: Secondary | ICD-10-CM

## 2012-08-19 DIAGNOSIS — Z96649 Presence of unspecified artificial hip joint: Secondary | ICD-10-CM

## 2012-08-19 DIAGNOSIS — L8990 Pressure ulcer of unspecified site, unspecified stage: Secondary | ICD-10-CM

## 2012-08-19 MED ORDER — DOXYCYCLINE HYCLATE 100 MG PO TABS: 100.0000 mg | ORAL_TABLET | Freq: Two times a day (BID) | ORAL | Status: DC

## 2012-08-19 NOTE — Progress Notes (Signed)
Subjective:    Patient ID: Johnny Patrick, male    DOB: 1946/04/04, 67 y.o.   MRN: [id number]  HPI  67 year old Caucasian male with complicated PMHS including prior cryptococcal infection of arm, psoriatic arthritis (prev on methotrexate and prednisone), prev L THR (02-2008), prev R foot MSSA infection with sepsis 2009. He subsequently developed infection in his L second toe (MRI no osteo) And her underwent amputation 09-08-09. Cx's MSSA.   He was then seen Feb 2012 with cellulits of his R hand after a presumed cut to his hand. He completed a course of doxy for this. MRI showed no osteo, abscess or myofasciitis. By 3-13 he had worsening of swelling, erythema in his hand. He was rec to go to the ED which he refused. He got another 20 days of doxy after this then 10 days of bactrim (completed 07-02-10). Was seen in ID clinic 07-11-10 and was started on 30 more days of doxy (he was given choice of PIC or PO). He was seen again in ID clinic May 2012 and was to be continued on doxy to August 2012. (he was seen by Dr. [name] multiple times along this course). Patient developed swelling in his left ankle (where he has a fused joint) ARea  became edematous, and exquisitely tender to palpation. He began taking his home doxycycline. He was then seen by Norlene Campbell who saw him and tapped a fluid collection which showed no crystals multiple wbc predominantly PMNS >90%. Cell count was not done. He was switched to doxy and keflex and continued this thru following  Until re-evaluated by Dr. Cleophas Dunker with no improvement and changed to levaquin and doxycyline.   He had MRI done prior to visit with me which showed per Dr. Jena Gauss showed no abscess, no osteomyelitis, only minimal edema in tibia that he feels is c/w arthritis but not septic arthritis and fused joint.. He had ESR markedly elevated above 100 (but could be due to his arthritis).   I had him admitted to the hospital where he was placed on IV vancomycin with  rapid improvement in his erythema and pain per my colleague Dr. Luciana Axe who folllowed pt at [facility].   Pt had remaiend on vancomycin since with continued improvement in appearance of his leg. i SAW him in February 2013 with plans to observe him off of abx. However in interim he developed ankle swellin and restarted oral doxycline that he took for 2 to 3 weeks. Swelling improved at night. I saw him again in March 2013 and recommended observation off antibiotics which he did.   I had continued to have him OFF antibiotics and I saw him in October at which point in time he STILL had erythema and edema in his ankle.  Since then symptoms or redness and pain and swelling worsened in his left ankle and he ultimately restarted his doxy and saw Dr. Victorino Dike (Dr. Lestine Box had moved to [place]). Dr Victorino Dike performed MRI which per the pt showed osteomyelitis in the heel (I have not yet received records or spoken to Dr. Victorino Dike but will do both later today).  Recommendation is for curative BKA and pt came to see me to discuss further. I have also advocated for BKA for cure rather than attempts at IV--> chronic oral abx with risk of uncontrolled infection--_> bacteermia and sepsis.  He also has developed a pressure ulcer directly overlying his scar from his R THA site. NO pus. He continues to lie on this side every  night.   NO fevers chills or nausea.    Review of Systems  Constitutional: Negative for fever, chills, diaphoresis, activity change, appetite change, fatigue and unexpected weight change.  HENT: Negative for congestion, sore throat, rhinorrhea, sneezing, trouble swallowing and sinus pressure.   Eyes: Negative for photophobia and visual disturbance.  Respiratory: Negative for cough, chest tightness, shortness of breath, wheezing and stridor.   Cardiovascular: Negative for chest pain, palpitations and leg swelling.  Gastrointestinal: Negative for nausea, vomiting, abdominal pain, diarrhea, constipation, blood in  stool, abdominal distention and anal bleeding.  Genitourinary: Negative for dysuria, hematuria, flank pain and difficulty urinating.  Musculoskeletal: Positive for joint swelling and arthralgias. Negative for myalgias, back pain and gait problem.  Skin: Positive for color change and wound. Negative for pallor and rash.  Neurological: Negative for dizziness, tremors, weakness and light-headedness.  Hematological: Negative for adenopathy. Does not bruise/bleed easily.  Psychiatric/Behavioral: Negative for behavioral problems, confusion, sleep disturbance, dysphoric mood, decreased concentration and agitation.       Objective:   Physical Exam  Constitutional: He is oriented to person, place, and time. He appears well-developed and well-nourished. No distress.  HENT:  Head: Normocephalic and atraumatic.  Mouth/Throat: Oropharynx is clear and moist.  Eyes: Conjunctivae and EOM are normal. Pupils are equal, round, and reactive to light.  Neck: Normal range of motion. Neck supple.  Cardiovascular: Normal rate, regular rhythm and normal heart sounds.   Pulmonary/Chest: Effort normal. No respiratory distress.  Abdominal: He exhibits no distension.  Musculoskeletal: He exhibits edema. He exhibits no tenderness.       Legs:      Feet:  Neurological: He is alert and oriented to person, place, and time. He exhibits normal muscle tone. Coordination normal.  Skin: Skin is warm and dry. He is not diaphoretic. There is erythema. No pallor.  Psychiatric: He has a normal mood and affect. His behavior is normal. Judgment and thought content normal.          Assessment & Plan:  1) Chronic Left ankle osteomyelitis: I agree with proceeding for curative BKA with 48hours of IV abx int he hospital followed by rehab. IF he decides to go for less optimal approach of bone biopsy, targetted IV abx and prolonged po abx we CAN do this but would prefer to go for cure. For now continue Doxy (I wrote new rx for  him)  2) Decubitus ulcer: we provided him with dressing for this and asked him to NOT sleep on this hip, should show to ortho  3) cryptococcal infection no evidence or recurrence.   I spent greater than 45 minutes with the patient including greater than 50% of time in face to face counsel of the patient and in coordination of their care.

## 2012-09-01 ENCOUNTER — Other Ambulatory Visit: Payer: Self-pay | Admitting: *Deleted

## 2012-09-01 MED ORDER — LOSARTAN POTASSIUM 50 MG PO TABS: 50.0000 mg | ORAL_TABLET | Freq: Every day | ORAL | Status: DC

## 2012-09-02 ENCOUNTER — Ambulatory Visit: Payer: Medicare Other | Admitting: Cardiology

## 2012-09-09 ENCOUNTER — Encounter: Payer: Medicare Other | Admitting: Physician Assistant

## 2012-09-09 DIAGNOSIS — Z01818 Encounter for other preprocedural examination: Secondary | ICD-10-CM | POA: Insufficient documentation

## 2012-09-09 NOTE — Progress Notes (Signed)
Patient arrived for preoperative clearance, but wife refused for husband to see anyone but Dr. Daleen Squibb. Appointment rescheduled.

## 2012-09-18 ENCOUNTER — Encounter: Payer: Medicare Other | Admitting: Cardiology

## 2012-09-18 ENCOUNTER — Other Ambulatory Visit: Payer: Self-pay | Admitting: Internal Medicine

## 2012-09-18 NOTE — Telephone Encounter (Signed)
Please advise if okay to refill. Thanks.  

## 2012-09-18 NOTE — Telephone Encounter (Signed)
Ok to refill 

## 2012-09-21 NOTE — Telephone Encounter (Signed)
Refill called to pharmacy-gave verbally over phone to pharmacy tech.

## 2012-09-25 ENCOUNTER — Encounter: Payer: Self-pay | Admitting: Internal Medicine

## 2012-09-25 ENCOUNTER — Ambulatory Visit (INDEPENDENT_AMBULATORY_CARE_PROVIDER_SITE_OTHER): Payer: Medicare Other | Admitting: Internal Medicine

## 2012-09-25 VITALS — BP 122/70 | HR 75 | Ht 65.0 in | Wt 155.4 lb

## 2012-09-25 DIAGNOSIS — G4733 Obstructive sleep apnea (adult) (pediatric): Secondary | ICD-10-CM

## 2012-09-25 DIAGNOSIS — J449 Chronic obstructive pulmonary disease, unspecified: Secondary | ICD-10-CM

## 2012-09-25 DIAGNOSIS — IMO0001 Reserved for inherently not codable concepts without codable children: Secondary | ICD-10-CM

## 2012-09-25 NOTE — Patient Instructions (Addendum)
Ok to continue Symbicort  Consider getting an otc ear wax removal kit at your drug store. They can show you where they are on the shelf. It will have directions for clearing your left ear.  Please call as needed

## 2012-09-25 NOTE — Progress Notes (Signed)
Patient ID: Johnny Patrick, male    DOB: 1945/05/14, 67 y.o.   MRN: [id number]  HPI 08/23/10-65 yoM former smoker followed for COPD with hx cryptococcosis, OSA, allergic rhinitis, hx lung nodule, GERD, psoriatic arthritis, degenerative disk disease Last here February 20, 2010 after rods removed from spine. Continues f/u for anemia of chronic disease.  Since last here chronic cough has largely resolved.  Breathing now feels normal w/o cough, wheeze or shortness of breath. He does tire easily and is unsteady with rods out, so uses cart to ride at store. Discussed last CXR 6/ 2011  02/24/11- 65 yoM former smoker followed for COPD with hx cryptococcosis, OSA, allergic rhinitis, hx lung nodule, GERD, psoriatic arthritis, degenerative disk disease He has had flu vaccine. He reports being comfortable with CPAP at 12/[facility] and able to use it all night every night with good control. Since last here he had right hip replacement with no problems on September 25. He describes nonspecific pains across his low back and in the low back muscles which do not affect his breathing. He denies lung or respiratory discomfort and had no respiratory problems with his hip surgery. He was seen by Dr. [name] for infectious disease because of left arm nodules/cryptococcosis. Chest x-ray 08/23/2010-no active cardiopulmonary disease. Removal of thoracic rods, mild compression fractures and kyphosis. He had used his rescue inhaler just before coming today. He has continued to use temazepam for insomnia. Ambien used in hospital worked about the same.  09/16/11-  65 yoM former smoker followed for COPD with hx cryptococcosis, OSA, allergic rhinitis, hx lung nodule, GERD, psoriatic arthritis, degenerative disk disease  Denies sob, cough, congestion, and wheezing His COPD assessment test (CAT) score today is 4/40 He denies active shortness of breath or wheeze given his very limited exercise capability. He had a  cellulitis in his left foot requiring intravenous Augmentin through a PICC line. Cryptococcosis infection on the right arm resolved under Dr. Moshe Cipro care. Obstructive sleep apnea-he continues CPAP 12/[facility] and says he is able to use it all night every night and that it helps his sleep. Chronic difficulty initiating and maintaining sleep/insomnia does well with occasional temazepam.  03/27/12- 65 yoM former smoker followed for COPD with hx cryptococcosis, OSA, allergic rhinitis, hx lung nodule, GERD, psoriatic arthritis, degenerative disk disease FOLLOWS NWG:NFAOZ CPAP12/ [facility] some nights-about 1 time a month. He has not felt the need since he lost weight for years ago. Family does not tell him he snores. He admits an occasional nap but does not fight sleepiness. COPD has been symptomatically stable. Rare wheeze. He is curious about advertisements for Spiriva which we discussed. We discussed anemia as a basis for dyspnea. His hemoglobin stays around 11/ [name]. CXR 6/13 /12 IMPRESSION:  1. No acute cardiopulmonary disease.  2. Removal of thoracic posterior rod and pedicle screw fixation  apparatus.  3. Mid thoracic compression fractures and exaggerated thoracic  kyphosis.  Original Report Authenticated By: Andreas Newport, M.D.  09/25/12- 55 yoM former smoker followed for COPD with hx cryptococcosis, OSA/ stopped CPAP, allergic rhinitis, hx lung nodule, GERD, psoriatic arthritis, degenerative disk disease FOLLOWS FOR:has stopped CPAP all together- "not needed"; doing great-denies any snoring or sleep troubles; denies any wheezing, SOB, cough, or congestion as well. Tried Spiriva but prefers  Symbicort.   Review of Systems-see HPI Constitutional:   No-   weight loss, night sweats, fevers, chills, fatigue, lassitude. HEENT:   No-  headaches, difficulty swallowing, tooth/dental problems, sore  throat,       No-  sneezing, itching, ear ache, nasal congestion,  post nasal drip,  CV:  No-   chest pain, orthopnea, PND, swelling in lower extremities, anasarca, dizziness, palpitations Resp: No- acute shortness of breath with his limited exertion or at rest.              No-   productive cough,  No non-productive cough,  No- coughing up of blood.              No-   change in color of mucus.  Little wheezing.   Skin: No-   rash or lesions. GI:  No-   heartburn, indigestion, abdominal pain, nausea, vomiting,  GU:  MS:  No-significant change in  joint pain or swelling, decreased range of motion.  +- back pain. Neuro-     nothing unusual Psych:  No- change in mood or affect. No depression or anxiety.  No memory loss.  Objective:   Physical Exam General- Alert, Oriented, Affect-appropriate, Distress- none acute. Chronically ill-appearing man  Skin- rash-none, lesions- none, excoriation- none Lymphadenopathy- none Head- atraumatic            Eyes- Gross vision intact, PERRLA, conjunctivae clear secretions            Ears- +Cerumen L            Nose- Clear, no-Septal dev, mucus, polyps, erosion, perforation              Throat- Mallampati II, mucosa clear, no- post-nasal drip, tonsils- atrophic. +Frequent throat clearing Neck- flexible , trachea midline, no stridor , thyroid nl, carotid no bruit Chest - symmetrical excursion , unlabored           Heart/CV- RRR , no murmur , no gallop  , no rub, nl s1 s2                           - JVD- none , edema- none, stasis changes- none, varices- none           Lung-  unlabored,  clear, wheeze- none, cough- none , dullness-none, rub- none           Chest wall-  Abd- Br/ Gen/ Rectal- Not done, not indicated Extrem- cyanosis- none, clubbing, none, atrophy- none, strength-not assessed. Cane Neuro- grossly intact to observation

## 2012-09-27 ENCOUNTER — Other Ambulatory Visit: Payer: Self-pay | Admitting: Cardiology

## 2012-10-02 ENCOUNTER — Encounter: Payer: Medicare Other | Admitting: Cardiology

## 2012-10-07 ENCOUNTER — Other Ambulatory Visit (HOSPITAL_BASED_OUTPATIENT_CLINIC_OR_DEPARTMENT_OTHER): Payer: Medicare Other | Admitting: Lab

## 2012-10-07 DIAGNOSIS — D638 Anemia in other chronic diseases classified elsewhere: Secondary | ICD-10-CM

## 2012-10-07 DIAGNOSIS — L4050 Arthropathic psoriasis, unspecified: Secondary | ICD-10-CM

## 2012-10-07 DIAGNOSIS — M069 Rheumatoid arthritis, unspecified: Secondary | ICD-10-CM

## 2012-10-07 LAB — CBC WITH DIFFERENTIAL/PLATELET
Eosinophils Absolute: 0.3 10*3/uL (ref 0.0–0.5)
MONO#: 0.5 10*3/uL (ref 0.1–0.9)
NEUT#: 6.7 10*3/uL — ABNORMAL HIGH (ref 1.5–6.5)
Platelets: 380 10*3/uL (ref 140–400)
RBC: 4.03 10*6/uL — ABNORMAL LOW (ref 4.20–5.82)
RDW: 17 % — ABNORMAL HIGH (ref 11.0–14.6)
WBC: 8.5 10*3/uL (ref 4.0–10.3)
lymph#: 1 10*3/uL (ref 0.9–3.3)

## 2012-10-07 LAB — IRON AND TIBC CHCC
%SAT: 18 % — ABNORMAL LOW (ref 20–55)
Iron: 42 ug/dL (ref 42–163)
TIBC: 228 ug/dL (ref 202–409)
UIBC: 186 ug/dL (ref 117–376)

## 2012-10-07 LAB — BASIC METABOLIC PANEL (CC13)
BUN: 17.5 mg/dL (ref 7.0–26.0)
Calcium: 10.2 mg/dL (ref 8.4–10.4)
Creatinine: 0.8 mg/dL (ref 0.7–1.3)

## 2012-10-07 LAB — FERRITIN CHCC: Ferritin: 226 ng/ml (ref 22–316)

## 2012-10-11 NOTE — Assessment & Plan Note (Signed)
Good control currently, using Symbicort. No significant exposures.

## 2012-10-11 NOTE — Assessment & Plan Note (Signed)
He denies daytime sleepiness and says he is told he is not snoring without CPAP so he is not using it.

## 2012-10-12 ENCOUNTER — Ambulatory Visit (HOSPITAL_BASED_OUTPATIENT_CLINIC_OR_DEPARTMENT_OTHER): Payer: Medicare Other | Admitting: Oncology

## 2012-10-12 ENCOUNTER — Telehealth: Payer: Self-pay | Admitting: Oncology

## 2012-10-12 VITALS — BP 141/76 | HR 73 | Temp 98.3°F | Resp 18 | Ht 65.0 in | Wt 153.6 lb

## 2012-10-12 DIAGNOSIS — L4050 Arthropathic psoriasis, unspecified: Secondary | ICD-10-CM

## 2012-10-12 DIAGNOSIS — M069 Rheumatoid arthritis, unspecified: Secondary | ICD-10-CM

## 2012-10-12 NOTE — Progress Notes (Signed)
Hematology and Oncology Follow Up Visit  Johnny Patrick [id number] 1945/09/23 67 y.o. 10/12/2012 8:21 PM   Principle Diagnosis: Encounter Diagnoses  Name Primary?  . Rheumatoid arthritis Yes  . Psoriatic arthritis      Interim History:   Followup visit for this pleasant 67 year old man initially referred to this office back in 2011 for perioperative evaluation of multifactorial anemia in anticipation of extensive spinal surgery to remove previous orthopedic hardware. He has long-standing rheumatoid arthritis and psoriatic arthritis and has been on chronic immunosuppressive medications. He was running hemoglobins around 8 g prior to the surgery. He underwent successful surgery. He did require limited transfusion support which was anticipated.  A bone marrow aspiration and biopsy done on 01/11/2010 showed dysplastic changes consistent with the effect of his immunosuppressive drugs in particular methotrexate. Methotrexate was discontinued. Hemoglobin improved significantly. When I saw him in  May, 2013 hemoglobin was up to 12 g.  He underwent a right hip replacement in September 2012. He developed perioperative cellulitis requiring readmission to the hospital and a prolonged course of vancomycin antibiotics.  He had a toe on his left foot amputated for MRSA osteomyelitis. He has now developed chronic pain and swelling of his left ankle with recent evaluation consistent with osteomyelitis. His orthopedist recommended a below the knee amputation and his infectious disease physician supports this. He is going to have a second opinion at [facility] by a Dr. Clyde Canterbury. Current immunosuppressants include low-dose prednisone 5 mg daily and Arava 20 mg 1-1/2 tablets daily. He was told to discontinue the [place] in anticipation of upcoming surgery. He was put on Questran to accelerate elimination of the [place].    Medications: reviewed  Allergies:  Allergies  Allergen Reactions  . Amoxicillin-Pot  Clavulanate     "liver problems" highly questionable because he was in septic shock  . Sulfur Rash    Review of Systems: Constitutional:   Currently no fevers or recent weight loss Respiratory: No cough or dyspnea Cardiovascular:  No chest pain or palpitations Gastrointestinal: No change in bowel habit Genito-Urinary: No urinary tract symptoms Musculoskeletal: See above Neurologic: No headache or change in vision Skin: He is developed an early decubitus ulcer on the skin of his right thigh. His wife who is an Therapist, sports, is  following recommendations of the infection expert and  ulcer is already healing. Remaining ROS negative.  Physical Exam: Blood pressure 141/76, pulse 73, temperature 98.3 F (36.8 C), temperature source Oral, resp. rate 18, height 5\' 5"  (1.651 m), weight 153 lb 9.6 oz (69.673 kg). Wt Readings from Last 3 Encounters:  10/12/12 153 lb 9.6 oz (69.673 kg)  09/25/12 155 lb 6.4 oz (70.489 kg)  08/19/12 167 lb (75.751 kg)     General appearance: Adequately nourished Caucasian man HENNT: Pharynx no erythema or exudate Lymph nodes: No adenopathy Breasts: Lungs: Clear" resonant to percussion Heart: Regular rhythm no murmur Abdomen: Soft, nontender Extremities: Asymmetric brawny edema, erythema, nodularity, and heat involving the left foot and ankle Musculoskeletal: Joint deformities consistent with rheumatoid arthritis GU: Vascular: No cyanosis  Neurologic: Grossly normal Skin: I did not examine the right thigh  Lab Results: Lab Results  Component Value Date   WBC 8.5 10/07/2012   HGB 11.0* 10/07/2012   HCT 33.8* 10/07/2012   MCV 83.9 10/07/2012   PLT 380 10/07/2012     Chemistry      Component Value Date/Time   NA 140 10/07/2012 1315   NA 140 01/22/2012 1424   K 5.2* 10/07/2012  1315   K 5.4* 01/22/2012 1424   CL 99 01/22/2012 1424   CO2 28 10/07/2012 1315   CO2 32 01/22/2012 1424   BUN 17.5 10/07/2012 1315   BUN 19 01/22/2012 1424   CREATININE 0.8 10/07/2012 1315    CREATININE 0.80 01/22/2012 1424   CREATININE 1.5 06/20/2011 1656   CREATININE 0.70 11/01/2009 1146      Component Value Date/Time   CALCIUM 10.2 10/07/2012 1315   CALCIUM 9.5 01/22/2012 1424   ALKPHOS 239* 04/24/2011 1738   AST 17 04/24/2011 1738   ALT 21 04/24/2011 1738   BILITOT 0.3 04/24/2011 1738       Radiological Studies: No results found.  Impression: #1. Anemia of chronic disease Hemoglobin has been stable at 11 g for over one year. I told the patient that he has so many other doctors that he does not need to see me again unless things change in the future.  #2. Long-standing rheumatoid arthritis and psoriatic arthritis  #3. Osteomyelitis left foot second toe requiring amputation in the past.  #4. Osteomyelitis left ankle bones with plan for surgery in the near future.  #5. History of a soft tissue cryptococcal infection of the arm  #6. New decubitus ulcer right thigh currently under treatment  #7. Hyperkalemia He is taking 20 mEq of potassium daily along with a diuretic. Renal function currently normal. I told to stop the potassium supplement for one week and then notify his internist to repeat a chemistry profile.  #8. 2 years status post removal of extensive orthopedic hardware from the spine     CC:. Dr Ashby Dawes; Dr Priscille Heidelberg;    Annia Belt, MD 7/7/20148:21 PM

## 2012-10-12 NOTE — Telephone Encounter (Signed)
Per 7/7 pof return PRN. Pt/wife aware.

## 2012-10-16 ENCOUNTER — Telehealth: Payer: Self-pay | Admitting: *Deleted

## 2012-10-16 NOTE — Telephone Encounter (Signed)
ON 10/12/12 DR.GRANFORTUNA INSTRUCTED PT. TO STOP HIS POTASSIUM SINCE LAB WORK SHOWED HIS POTASSIUM WAS 5.2. PT. ONLY HALVED HIS POTASSIUM DOSE. HE HAD LABS DONE AT [facility] TODAY. HIS POTASSIUM IS 6.1. VERBAL ORDER AND READ BACK TO DR.GRANFORTUNA- KAYEXALATE  30GM BY MOUTH TIMES ONE DOSE. STOP POTASSIUM. CALL PRIMARY CARE PHYSICIAN ON Monday TO HAVE PT'S POTASSIUM LEVEL CHECKED. CALLED MEDICATION TO [facility]. NOTIFIED PT.'S WIFE VIA HOME VOICE MAIL

## 2012-10-18 ENCOUNTER — Other Ambulatory Visit: Payer: Self-pay | Admitting: Internal Medicine

## 2012-10-21 NOTE — Telephone Encounter (Signed)
Last OV 09/25/2012 Pending OV 09/27/2013 Last fill 09/21/2012 #60 with 0 additional refills  Allergies  Allergen Reactions  . Amoxicillin-Pot Clavulanate     "liver problems" highly questionable because he was in septic shock  . Sulfur Rash    CY - please advise if this is okay to fill. Thanks.

## 2012-10-22 ENCOUNTER — Ambulatory Visit: Payer: Medicare Other | Admitting: Infectious Disease

## 2012-10-22 NOTE — Telephone Encounter (Signed)
Rx has been called in per CY. 

## 2012-10-22 NOTE — Telephone Encounter (Signed)
Ok to refill temazepam 6 months

## 2012-11-05 ENCOUNTER — Ambulatory Visit: Payer: Medicare Other | Admitting: Infectious Disease

## 2012-11-11 ENCOUNTER — Other Ambulatory Visit: Payer: Self-pay

## 2012-11-13 DIAGNOSIS — T819XXA Unspecified complication of procedure, initial encounter: Secondary | ICD-10-CM | POA: Diagnosis not present

## 2012-11-13 DIAGNOSIS — T8131XA Disruption of external operation (surgical) wound, not elsewhere classified, initial encounter: Secondary | ICD-10-CM | POA: Diagnosis not present

## 2012-12-09 ENCOUNTER — Ambulatory Visit: Payer: Medicare Other | Admitting: Infectious Disease

## 2013-02-11 ENCOUNTER — Other Ambulatory Visit: Payer: Self-pay

## 2013-04-12 ENCOUNTER — Ambulatory Visit: Payer: Medicare Other | Attending: Orthopaedic Surgery | Admitting: Physical Therapy

## 2013-04-12 DIAGNOSIS — L4050 Arthropathic psoriasis, unspecified: Secondary | ICD-10-CM | POA: Insufficient documentation

## 2013-04-12 DIAGNOSIS — E039 Hypothyroidism, unspecified: Secondary | ICD-10-CM | POA: Insufficient documentation

## 2013-04-12 DIAGNOSIS — E119 Type 2 diabetes mellitus without complications: Secondary | ICD-10-CM | POA: Insufficient documentation

## 2013-04-12 DIAGNOSIS — S88119A Complete traumatic amputation at level between knee and ankle, unspecified lower leg, initial encounter: Secondary | ICD-10-CM | POA: Insufficient documentation

## 2013-04-12 DIAGNOSIS — I10 Essential (primary) hypertension: Secondary | ICD-10-CM | POA: Insufficient documentation

## 2013-04-12 DIAGNOSIS — Z4789 Encounter for other orthopedic aftercare: Secondary | ICD-10-CM | POA: Insufficient documentation

## 2013-04-12 DIAGNOSIS — J4489 Other specified chronic obstructive pulmonary disease: Secondary | ICD-10-CM | POA: Insufficient documentation

## 2013-04-12 DIAGNOSIS — Z96649 Presence of unspecified artificial hip joint: Secondary | ICD-10-CM | POA: Insufficient documentation

## 2013-04-12 DIAGNOSIS — J449 Chronic obstructive pulmonary disease, unspecified: Secondary | ICD-10-CM | POA: Insufficient documentation

## 2013-04-22 ENCOUNTER — Other Ambulatory Visit: Payer: Self-pay | Admitting: Internal Medicine

## 2013-04-23 NOTE — Telephone Encounter (Signed)
Ok to refill 6 months 

## 2013-04-23 NOTE — Telephone Encounter (Signed)
Please advise if okay to refill; Pt has next OV on 09-2013. Thanks.

## 2013-04-26 NOTE — Telephone Encounter (Signed)
Rx has been called in  

## 2013-06-07 ENCOUNTER — Encounter: Payer: Self-pay | Admitting: Oncology

## 2013-09-27 ENCOUNTER — Ambulatory Visit (INDEPENDENT_AMBULATORY_CARE_PROVIDER_SITE_OTHER): Payer: Medicare Other | Admitting: Internal Medicine

## 2013-09-27 ENCOUNTER — Encounter: Payer: Self-pay | Admitting: Internal Medicine

## 2013-09-27 ENCOUNTER — Ambulatory Visit (INDEPENDENT_AMBULATORY_CARE_PROVIDER_SITE_OTHER)
Admission: RE | Admit: 2013-09-27 | Discharge: 2013-09-27 | Disposition: A | Discharge status: Home / Self Care | Payer: Medicare Other | Source: Ambulatory Visit | Attending: Internal Medicine | Admitting: Internal Medicine

## 2013-09-27 VITALS — BP 128/72 | HR 82 | Ht 65.0 in | Wt 152.0 lb

## 2013-09-27 DIAGNOSIS — J4489 Other specified chronic obstructive pulmonary disease: Secondary | ICD-10-CM

## 2013-09-27 DIAGNOSIS — J441 Chronic obstructive pulmonary disease with (acute) exacerbation: Secondary | ICD-10-CM

## 2013-09-27 DIAGNOSIS — J984 Other disorders of lung: Secondary | ICD-10-CM

## 2013-09-27 DIAGNOSIS — G4700 Insomnia, unspecified: Secondary | ICD-10-CM

## 2013-09-27 DIAGNOSIS — J449 Chronic obstructive pulmonary disease, unspecified: Secondary | ICD-10-CM

## 2013-09-27 DIAGNOSIS — IMO0001 Reserved for inherently not codable concepts without codable children: Secondary | ICD-10-CM

## 2013-09-27 DIAGNOSIS — F5104 Psychophysiologic insomnia: Secondary | ICD-10-CM

## 2013-09-27 MED ORDER — TEMAZEPAM 15 MG PO CAPS: ORAL_CAPSULE | ORAL | Status: DC

## 2013-09-27 NOTE — Progress Notes (Signed)
Patient ID: Johnny Patrick, male    DOB: Sep 24, 1945, 68 y.o.   MRN: [id number]  HPI 08/23/10-68 yoM former smoker followed for COPD with hx cryptococcosis, OSA, allergic rhinitis, hx lung nodule, GERD, psoriatic arthritis, degenerative disk disease Last here February 20, 2010 after rods removed from spine. Continues f/u for anemia of chronic disease.  Since last here chronic cough has largely resolved.  Breathing now feels normal w/o cough, wheeze or shortness of breath. He does tire easily and is unsteady with rods out, so uses cart to ride at store. Discussed last CXR 6/ 2011  02/24/11- 68 yoM former smoker followed for COPD with hx cryptococcosis, OSA, allergic rhinitis, hx lung nodule, GERD, psoriatic arthritis, degenerative disk disease He has had flu vaccine. He reports being comfortable with CPAP at 12/[facility] and able to use it all night every night with good control. Since last here he had right hip replacement with no problems on September 25. He describes nonspecific pains across his low back and in the low back muscles which do not affect his breathing. He denies lung or respiratory discomfort and had no respiratory problems with his hip surgery. He was seen by Dr. [name] for infectious disease because of left arm nodules/cryptococcosis. Chest x-ray 08/23/2010-no active cardiopulmonary disease. Removal of thoracic rods, mild compression fractures and kyphosis. He had used his rescue inhaler just before coming today. He has continued to use temazepam for insomnia. Ambien used in hospital worked about the same.  09/16/11-  68 yoM former smoker followed for COPD with hx cryptococcosis, OSA, allergic rhinitis, hx lung nodule, GERD, psoriatic arthritis, degenerative disk disease  Denies sob, cough, congestion, and wheezing His COPD assessment test (CAT) score today is 4/40 He denies active shortness of breath or wheeze given his very limited exercise capability. He had a  cellulitis in his left foot requiring intravenous Augmentin through a PICC line. Cryptococcosis infection on the right arm resolved under Dr. Moshe Cipro care. Obstructive sleep apnea-he continues CPAP 12/[facility] and says he is able to use it all night every night and that it helps his sleep. Chronic difficulty initiating and maintaining sleep/insomnia does well with occasional temazepam.  03/27/12- 68 yoM former smoker followed for COPD with hx cryptococcosis, OSA, allergic rhinitis, hx lung nodule, GERD, psoriatic arthritis, degenerative disk disease FOLLOWS [id number] CPAP12/ [facility] some nights-about 1 time a month. He has not felt the need since he lost weight for years ago. Family does not tell him he snores. He admits an occasional nap but does not fight sleepiness. COPD has been symptomatically stable. Rare wheeze. He is curious about advertisements for Spiriva which we discussed. We discussed anemia as a basis for dyspnea. His hemoglobin stays around 11/ [name]. CXR 6/13 /12 IMPRESSION:  1. No acute cardiopulmonary disease.  2. Removal of thoracic posterior rod and pedicle screw fixation  apparatus.  3. Mid thoracic compression fractures and exaggerated thoracic  kyphosis.  Original Report Authenticated By: Andreas Newport, M.D.  09/25/12- 68 yoM former smoker followed for COPD with hx cryptococcosis, OSA/ stopped CPAP, allergic rhinitis, hx lung nodule, GERD, psoriatic arthritis, degenerative disk disease FOLLOWS FOR:has stopped CPAP all together- "not needed"; doing great-denies any snoring or sleep troubles; denies any wheezing, SOB, cough, or congestion as well. Tried Spiriva but prefers  Symbicort.  09/27/13- 68 yoM former smoker followed for COPD with hx cryptococcosis, OSA/ stopped CPAP, allergic rhinitis, hx lung nodule, GERD, psoriatic arthritis, degenerative disk disease FOLLOWS FOR:  Pt reports  breathing doing well and has no concerns Had left BKA  for nonhealing osteomyelitis.  Denies cough or wheeze   Review of Systems-see HPI Constitutional:   No-   weight loss, night sweats, fevers, chills, fatigue, lassitude. HEENT:   No-  headaches, difficulty swallowing, tooth/dental problems, sore throat,       No-  sneezing, itching, ear ache, nasal congestion, post nasal drip,  CV:  No-   chest pain, orthopnea, PND, swelling in lower extremities, anasarca, dizziness, palpitations Resp: No- acute shortness of breath with his limited exertion or at rest.              No-   productive cough,  No non-productive cough,  No- coughing up of blood.              No-   change in color of mucus.  Little wheezing.   Skin: No-   rash or lesions. GI:  No-   heartburn, indigestion, abdominal pain, nausea, vomiting,  GU:  MS:  No-significant change in  joint pain or swelling, +- back pain.  Neuro-     nothing unusual Psych:  No- change in mood or affect. No depression or anxiety.  No memory loss.  Objective:   Physical Exam General- Alert, Oriented, Affect-appropriate, Distress- none acute. Chronically ill-appearing man  Skin- rash-none, lesions- none, excoriation- none Lymphadenopathy- none Head- atraumatic            Eyes- Gross vision intact, PERRLA, conjunctivae clear secretions            Ears- +Cerumen L            Nose- Clear, no-Septal dev, mucus, polyps, erosion, perforation              Throat- Mallampati II, mucosa clear, no- post-nasal drip, tonsils- atrophic.  Neck- flexible , trachea midline, no stridor , thyroid nl, carotid no bruit Chest - symmetrical excursion , unlabored           Heart/CV- RRR , no murmur , no gallop  , no rub, nl s1 s2                           - JVD- none , edema- none, stasis changes- none, varices- none           Lung-  unlabored,  Crackles + few, wheeze- none, cough- none , dullness-none, rub- none           Chest wall-  Abd- Br/ Gen/ Rectal- Not done, not indicated Extrem- cyanosis- none, clubbing, none,  atrophy- none, strength-not assessed. +S/P L BKA Neuro- grossly intact to observation

## 2013-09-27 NOTE — Patient Instructions (Signed)
Script refilling Temazepam   Order- CXR   Dx COPD

## 2013-09-29 ENCOUNTER — Telehealth: Payer: Self-pay | Admitting: Internal Medicine

## 2013-09-29 NOTE — Telephone Encounter (Signed)
Spoke with pt's wife and advised of cxr result per CY.  She verbalized understanding and had no questions

## 2013-10-20 ENCOUNTER — Other Ambulatory Visit: Payer: Self-pay | Admitting: Internal Medicine

## 2013-12-05 NOTE — Assessment & Plan Note (Signed)
Plan-chest x-ray for update

## 2013-12-05 NOTE — Assessment & Plan Note (Signed)
Controlled Plan-chest x-ray

## 2013-12-05 NOTE — Assessment & Plan Note (Signed)
Discussed sleep hygiene Plan-refill temazepam

## 2013-12-31 ENCOUNTER — Encounter: Payer: Self-pay | Admitting: Internal Medicine

## 2014-04-15 ENCOUNTER — Other Ambulatory Visit: Payer: Self-pay | Admitting: Internal Medicine

## 2014-04-15 NOTE — Telephone Encounter (Signed)
Ok to refill 

## 2014-04-15 NOTE — Telephone Encounter (Signed)
Please advise on refill. Thanks. 

## 2014-05-11 DIAGNOSIS — L4059 Other psoriatic arthropathy: Secondary | ICD-10-CM | POA: Diagnosis not present

## 2014-05-11 DIAGNOSIS — M25512 Pain in left shoulder: Secondary | ICD-10-CM | POA: Diagnosis not present

## 2014-05-11 DIAGNOSIS — M7550 Bursitis of unspecified shoulder: Secondary | ICD-10-CM | POA: Diagnosis not present

## 2014-05-18 ENCOUNTER — Other Ambulatory Visit: Payer: Self-pay | Admitting: Internal Medicine

## 2014-05-19 NOTE — Telephone Encounter (Signed)
CY Please advise on refill. Thanks.  

## 2014-06-22 DIAGNOSIS — R52 Pain, unspecified: Secondary | ICD-10-CM | POA: Diagnosis not present

## 2014-06-22 DIAGNOSIS — M47816 Spondylosis without myelopathy or radiculopathy, lumbar region: Secondary | ICD-10-CM | POA: Diagnosis not present

## 2014-07-11 DIAGNOSIS — E1165 Type 2 diabetes mellitus with hyperglycemia: Secondary | ICD-10-CM | POA: Diagnosis not present

## 2014-07-11 DIAGNOSIS — L4059 Other psoriatic arthropathy: Secondary | ICD-10-CM | POA: Diagnosis not present

## 2014-07-11 DIAGNOSIS — E119 Type 2 diabetes mellitus without complications: Secondary | ICD-10-CM | POA: Diagnosis not present

## 2014-07-11 DIAGNOSIS — I10 Essential (primary) hypertension: Secondary | ICD-10-CM | POA: Diagnosis not present

## 2014-07-18 DIAGNOSIS — E039 Hypothyroidism, unspecified: Secondary | ICD-10-CM | POA: Diagnosis not present

## 2014-07-18 DIAGNOSIS — E119 Type 2 diabetes mellitus without complications: Secondary | ICD-10-CM | POA: Diagnosis not present

## 2014-07-18 DIAGNOSIS — I10 Essential (primary) hypertension: Secondary | ICD-10-CM | POA: Diagnosis not present

## 2014-07-18 DIAGNOSIS — K219 Gastro-esophageal reflux disease without esophagitis: Secondary | ICD-10-CM | POA: Diagnosis not present

## 2014-07-21 DIAGNOSIS — M7550 Bursitis of unspecified shoulder: Secondary | ICD-10-CM | POA: Diagnosis not present

## 2014-07-21 DIAGNOSIS — M25561 Pain in right knee: Secondary | ICD-10-CM | POA: Diagnosis not present

## 2014-07-21 DIAGNOSIS — L4059 Other psoriatic arthropathy: Secondary | ICD-10-CM | POA: Diagnosis not present

## 2014-09-27 ENCOUNTER — Telehealth: Payer: Self-pay | Admitting: Internal Medicine

## 2014-09-27 NOTE — Telephone Encounter (Signed)
Johnny Patrick, can you advise where to work this pt in at?

## 2014-09-27 NOTE — Telephone Encounter (Signed)
Spoke with patient-he actually can keep the appt 09-29-14 at 1:30pm and nothing more needed at this time.   His daughter is able to take his wife for her procedure.

## 2014-09-29 ENCOUNTER — Ambulatory Visit: Payer: Medicare Other | Admitting: Internal Medicine

## 2014-09-29 ENCOUNTER — Encounter: Payer: Self-pay | Admitting: Internal Medicine

## 2014-09-29 ENCOUNTER — Ambulatory Visit (INDEPENDENT_AMBULATORY_CARE_PROVIDER_SITE_OTHER): Payer: Medicare Other | Admitting: Internal Medicine

## 2014-09-29 VITALS — BP 114/68 | HR 80 | Ht 65.0 in | Wt 145.8 lb

## 2014-09-29 DIAGNOSIS — J449 Chronic obstructive pulmonary disease, unspecified: Secondary | ICD-10-CM

## 2014-09-29 DIAGNOSIS — IMO0001 Reserved for inherently not codable concepts without codable children: Secondary | ICD-10-CM

## 2014-09-29 NOTE — Progress Notes (Signed)
Patient ID: Johnny Patrick, male    DOB: Sep 24, 1945, 69 y.o.   MRN: [id number]  HPI 08/23/10-65 yoM former smoker followed for COPD with hx cryptococcosis, OSA, allergic rhinitis, hx lung nodule, GERD, psoriatic arthritis, degenerative disk disease Last here February 20, 2010 after rods removed from spine. Continues f/u for anemia of chronic disease.  Since last here chronic cough has largely resolved.  Breathing now feels normal w/o cough, wheeze or shortness of breath. He does tire easily and is unsteady with rods out, so uses cart to ride at store. Discussed last CXR 6/ 2011  02/24/11- 65 yoM former smoker followed for COPD with hx cryptococcosis, OSA, allergic rhinitis, hx lung nodule, GERD, psoriatic arthritis, degenerative disk disease He has had flu vaccine. He reports being comfortable with CPAP at 12/[facility] and able to use it all night every night with good control. Since last here he had right hip replacement with no problems on September 25. He describes nonspecific pains across his low back and in the low back muscles which do not affect his breathing. He denies lung or respiratory discomfort and had no respiratory problems with his hip surgery. He was seen by Dr. [name] for infectious disease because of left arm nodules/cryptococcosis. Chest x-ray 08/23/2010-no active cardiopulmonary disease. Removal of thoracic rods, mild compression fractures and kyphosis. He had used his rescue inhaler just before coming today. He has continued to use temazepam for insomnia. Ambien used in hospital worked about the same.  09/16/11-  65 yoM former smoker followed for COPD with hx cryptococcosis, OSA, allergic rhinitis, hx lung nodule, GERD, psoriatic arthritis, degenerative disk disease  Denies sob, cough, congestion, and wheezing His COPD assessment test (CAT) score today is 4/40 He denies active shortness of breath or wheeze given his very limited exercise capability. He had a  cellulitis in his left foot requiring intravenous Augmentin through a PICC line. Cryptococcosis infection on the right arm resolved under Dr. Moshe Cipro care. Obstructive sleep apnea-he continues CPAP 12/[facility] and says he is able to use it all night every night and that it helps his sleep. Chronic difficulty initiating and maintaining sleep/insomnia does well with occasional temazepam.  03/27/12- 65 yoM former smoker followed for COPD with hx cryptococcosis, OSA, allergic rhinitis, hx lung nodule, GERD, psoriatic arthritis, degenerative disk disease FOLLOWS [id number] CPAP12/ [facility] some nights-about 1 time a month. He has not felt the need since he lost weight for years ago. Family does not tell him he snores. He admits an occasional nap but does not fight sleepiness. COPD has been symptomatically stable. Rare wheeze. He is curious about advertisements for Spiriva which we discussed. We discussed anemia as a basis for dyspnea. His hemoglobin stays around 11/ [name]. CXR 6/13 /12 IMPRESSION:  1. No acute cardiopulmonary disease.  2. Removal of thoracic posterior rod and pedicle screw fixation  apparatus.  3. Mid thoracic compression fractures and exaggerated thoracic  kyphosis.  Original Report Authenticated By: Andreas Newport, M.D.  09/25/12- 66 yoM former smoker followed for COPD with hx cryptococcosis, OSA/ stopped CPAP, allergic rhinitis, hx lung nodule, GERD, psoriatic arthritis, degenerative disk disease FOLLOWS FOR:has stopped CPAP all together- "not needed"; doing great-denies any snoring or sleep troubles; denies any wheezing, SOB, cough, or congestion as well. Tried Spiriva but prefers  Symbicort.  09/27/13- 29 yoM former smoker followed for COPD with hx cryptococcosis, OSA/ stopped CPAP, allergic rhinitis, hx lung nodule, GERD, psoriatic arthritis, degenerative disk disease FOLLOWS FOR:  Pt reports  breathing doing well and has no concerns Had left BKA  for nonhealing osteomyelitis.  Denies cough or wheeze  09/29/14- 69 yoM former smoker followed for COPD with hx cryptococcosis, OSA/ stopped CPAP, allergic rhinitis, hx lung nodule, insomnia, complicated by GERD, psoriatic arthritis, degenerative disk disease, LBKA/osteomyelitis FOLLOWS FOR: Pt denies any trouble with SOB, wheezing, cough, or congestion. Feeling well at this visit as he continues prednisone 5 mg daily for arthritis. Using Symbicort twice daily for intervals when he needs it and denies need for a rescue inhaler. Insomnia is well controlled with temazepam CXR 09/27/13 IMPRESSION: No acute cardiopulmonary disease. Electronically Signed  By: Amie Portland M.D.  On: 09/27/2013 14:55   Review of Systems-see HPI Constitutional:   No-   weight loss, night sweats, fevers, chills, fatigue, lassitude. HEENT:   No-  headaches, difficulty swallowing, tooth/dental problems, sore throat,       No-  sneezing, itching, ear ache, nasal congestion, post nasal drip,  CV:  No-   chest pain, orthopnea, PND, swelling in lower extremities, anasarca, dizziness, palpitations Resp: No- acute shortness of breath with his limited exertion or at rest.              No-   productive cough,  No non-productive cough,  No- coughing up of blood.              No-   change in color of mucus.  Little wheezing.   Skin: No-   rash or lesions. GI:  No-   heartburn, indigestion, abdominal pain, nausea, vomiting,  GU:  MS:  No-significant change in  joint pain or swelling, +- back pain.  Neuro-     nothing unusual Psych:  No- change in mood or affect. No depression or anxiety.  No memory loss.  Objective:   Physical Exam General- Alert, Oriented, Affect-appropriate, Distress- none acute. Chronically ill-appearing man  Skin- rash-none, lesions- none, excoriation- none Lymphadenopathy- none Head- atraumatic            Eyes- Gross vision intact, PERRLA, conjunctivae clear secretions            Ears- +Cerumen  L            Nose- Clear, no-Septal dev, mucus, polyps, erosion, perforation              Throat- Mallampati II, mucosa clear, no- post-nasal drip, tonsils- atrophic.  Neck- flexible , trachea midline, no stridor , thyroid nl, carotid no bruit Chest - symmetrical excursion , unlabored           Heart/CV- RRR , no murmur , no gallop  , no rub, nl s1 s2                           - JVD- none , edema- none, stasis changes- none, varices- none           Lung-  unlabored,  Clear, wheeze- none, cough- none , dullness-none, rub- none           Chest wall-  Abd- Br/ Gen/ Rectal- Not done, not indicated Extrem- cyanosis- none, clubbing, none, atrophy- none, strength-not assessed. +S/P L BKA Neuro- grossly intact to observation

## 2014-09-29 NOTE — Patient Instructions (Signed)
We can continue Symbicort for now. Eventually we may want to change it to a simple rescue inhaler.  Please call if we can help

## 2014-10-09 NOTE — Assessment & Plan Note (Signed)
Maintenance prednisone undoubtedly helps but he is doing very well. We discussed inhaler use but see no reason to change.

## 2014-10-12 ENCOUNTER — Other Ambulatory Visit: Payer: Self-pay | Admitting: Internal Medicine

## 2014-10-13 NOTE — Telephone Encounter (Signed)
Ok 6 month refills 

## 2014-10-13 NOTE — Telephone Encounter (Signed)
Temazepam last refilled 05/19/14 #60 x 4 refills. Please advise Dr. Maple Hudson if okay to refill thanks

## 2014-10-14 ENCOUNTER — Telehealth: Payer: Self-pay | Admitting: Internal Medicine

## 2014-10-14 MED ORDER — TEMAZEPAM 15 MG PO CAPS: ORAL_CAPSULE | ORAL | Status: DC

## 2014-10-14 NOTE — Telephone Encounter (Signed)
Pt is aware refilled called into the pharmacy.

## 2014-10-14 NOTE — Telephone Encounter (Signed)
Ok 6 month refill

## 2014-10-14 NOTE — Telephone Encounter (Signed)
restoril 15 mg last refilled 05/19/14 #60 x 4 refills TAKE 1-2 CAPSULES AT BEDTIME AS NEEDED FOR SLEEP Last OV 09/29/14 Pending OV 09/29/15 Please advise Dr. Maple Hudson if okay to refill? thanks

## 2014-10-20 DIAGNOSIS — R52 Pain, unspecified: Secondary | ICD-10-CM | POA: Diagnosis not present

## 2014-10-20 DIAGNOSIS — M47816 Spondylosis without myelopathy or radiculopathy, lumbar region: Secondary | ICD-10-CM | POA: Diagnosis not present

## 2014-11-02 DIAGNOSIS — Z0000 Encounter for general adult medical examination without abnormal findings: Secondary | ICD-10-CM | POA: Diagnosis not present

## 2014-11-02 DIAGNOSIS — E039 Hypothyroidism, unspecified: Secondary | ICD-10-CM | POA: Diagnosis not present

## 2014-11-02 DIAGNOSIS — I10 Essential (primary) hypertension: Secondary | ICD-10-CM | POA: Diagnosis not present

## 2014-11-02 DIAGNOSIS — E119 Type 2 diabetes mellitus without complications: Secondary | ICD-10-CM | POA: Diagnosis not present

## 2014-11-02 DIAGNOSIS — K219 Gastro-esophageal reflux disease without esophagitis: Secondary | ICD-10-CM | POA: Diagnosis not present

## 2014-11-08 DIAGNOSIS — E119 Type 2 diabetes mellitus without complications: Secondary | ICD-10-CM | POA: Diagnosis not present

## 2014-11-08 DIAGNOSIS — I10 Essential (primary) hypertension: Secondary | ICD-10-CM | POA: Diagnosis not present

## 2014-11-08 DIAGNOSIS — K219 Gastro-esophageal reflux disease without esophagitis: Secondary | ICD-10-CM | POA: Diagnosis not present

## 2014-11-08 DIAGNOSIS — E039 Hypothyroidism, unspecified: Secondary | ICD-10-CM | POA: Diagnosis not present

## 2014-12-21 DIAGNOSIS — M25551 Pain in right hip: Secondary | ICD-10-CM | POA: Diagnosis not present

## 2014-12-21 DIAGNOSIS — M545 Low back pain: Secondary | ICD-10-CM | POA: Diagnosis not present

## 2015-01-19 DIAGNOSIS — M25512 Pain in left shoulder: Secondary | ICD-10-CM | POA: Diagnosis not present

## 2015-01-19 DIAGNOSIS — M7550 Bursitis of unspecified shoulder: Secondary | ICD-10-CM | POA: Diagnosis not present

## 2015-01-19 DIAGNOSIS — L4059 Other psoriatic arthropathy: Secondary | ICD-10-CM | POA: Diagnosis not present

## 2015-01-19 DIAGNOSIS — E039 Hypothyroidism, unspecified: Secondary | ICD-10-CM | POA: Diagnosis not present

## 2015-01-24 DIAGNOSIS — M47816 Spondylosis without myelopathy or radiculopathy, lumbar region: Secondary | ICD-10-CM | POA: Diagnosis not present

## 2015-01-24 DIAGNOSIS — R52 Pain, unspecified: Secondary | ICD-10-CM | POA: Diagnosis not present

## 2015-03-14 DIAGNOSIS — I10 Essential (primary) hypertension: Secondary | ICD-10-CM | POA: Diagnosis not present

## 2015-03-14 DIAGNOSIS — E119 Type 2 diabetes mellitus without complications: Secondary | ICD-10-CM | POA: Diagnosis not present

## 2015-03-14 DIAGNOSIS — E039 Hypothyroidism, unspecified: Secondary | ICD-10-CM | POA: Diagnosis not present

## 2015-03-21 DIAGNOSIS — E039 Hypothyroidism, unspecified: Secondary | ICD-10-CM | POA: Diagnosis not present

## 2015-03-21 DIAGNOSIS — E119 Type 2 diabetes mellitus without complications: Secondary | ICD-10-CM | POA: Diagnosis not present

## 2015-03-21 DIAGNOSIS — I10 Essential (primary) hypertension: Secondary | ICD-10-CM | POA: Diagnosis not present

## 2015-04-03 ENCOUNTER — Other Ambulatory Visit: Payer: Self-pay | Admitting: Internal Medicine

## 2015-04-06 ENCOUNTER — Other Ambulatory Visit: Payer: Self-pay | Admitting: Internal Medicine

## 2015-04-07 NOTE — Telephone Encounter (Signed)
Ok to refill 

## 2015-04-07 NOTE — Telephone Encounter (Signed)
CY please advise on refill  

## 2015-05-11 ENCOUNTER — Telehealth: Payer: Self-pay | Admitting: Internal Medicine

## 2015-05-11 NOTE — Telephone Encounter (Addendum)
Closed in Error ------------------------------- Patient has been taking Temazepam.  Can only get 45 tablets in 75 days.  Patient takes 2 tablets every night and will need to get an override so his insurance will cover the amount he needs.  Spoke with Tammy at [employer].  She said that she faxed a form for Dr. Maple Hudson to fill out for an override. Did not see form in Dr. Roxy Cedar folder at front.  Will send to Katie to see if she has this form.

## 2015-05-17 DIAGNOSIS — E039 Hypothyroidism, unspecified: Secondary | ICD-10-CM | POA: Diagnosis not present

## 2015-05-17 DIAGNOSIS — L4050 Arthropathic psoriasis, unspecified: Secondary | ICD-10-CM | POA: Diagnosis not present

## 2015-05-17 DIAGNOSIS — Z79899 Other long term (current) drug therapy: Secondary | ICD-10-CM | POA: Diagnosis not present

## 2015-06-07 DIAGNOSIS — R52 Pain, unspecified: Secondary | ICD-10-CM | POA: Diagnosis not present

## 2015-06-07 DIAGNOSIS — M47816 Spondylosis without myelopathy or radiculopathy, lumbar region: Secondary | ICD-10-CM | POA: Diagnosis not present

## 2015-07-04 DIAGNOSIS — R0602 Shortness of breath: Secondary | ICD-10-CM | POA: Diagnosis not present

## 2015-07-04 DIAGNOSIS — M791 Myalgia: Secondary | ICD-10-CM | POA: Diagnosis not present

## 2015-07-04 DIAGNOSIS — M353 Polymyalgia rheumatica: Secondary | ICD-10-CM | POA: Diagnosis not present

## 2015-07-04 DIAGNOSIS — J441 Chronic obstructive pulmonary disease with (acute) exacerbation: Secondary | ICD-10-CM | POA: Diagnosis not present

## 2015-07-06 DIAGNOSIS — J441 Chronic obstructive pulmonary disease with (acute) exacerbation: Secondary | ICD-10-CM | POA: Diagnosis not present

## 2015-07-06 DIAGNOSIS — M353 Polymyalgia rheumatica: Secondary | ICD-10-CM | POA: Diagnosis not present

## 2015-07-06 DIAGNOSIS — R0602 Shortness of breath: Secondary | ICD-10-CM | POA: Diagnosis not present

## 2015-07-06 DIAGNOSIS — R918 Other nonspecific abnormal finding of lung field: Secondary | ICD-10-CM | POA: Diagnosis not present

## 2015-08-15 ENCOUNTER — Telehealth: Payer: Self-pay | Admitting: *Deleted

## 2015-08-15 DIAGNOSIS — I10 Essential (primary) hypertension: Secondary | ICD-10-CM | POA: Diagnosis not present

## 2015-08-15 DIAGNOSIS — E119 Type 2 diabetes mellitus without complications: Secondary | ICD-10-CM | POA: Diagnosis not present

## 2015-08-15 DIAGNOSIS — E039 Hypothyroidism, unspecified: Secondary | ICD-10-CM | POA: Diagnosis not present

## 2015-08-15 NOTE — Telephone Encounter (Signed)
Call pt's insurance and initiated PA for Symbicort. Approved until 08-14-16.  [id number].  Pharmacy informed.

## 2015-08-22 DIAGNOSIS — J441 Chronic obstructive pulmonary disease with (acute) exacerbation: Secondary | ICD-10-CM | POA: Diagnosis not present

## 2015-08-22 DIAGNOSIS — E039 Hypothyroidism, unspecified: Secondary | ICD-10-CM | POA: Diagnosis not present

## 2015-08-22 DIAGNOSIS — E119 Type 2 diabetes mellitus without complications: Secondary | ICD-10-CM | POA: Diagnosis not present

## 2015-09-28 ENCOUNTER — Other Ambulatory Visit: Payer: Self-pay | Admitting: Internal Medicine

## 2015-09-28 NOTE — Telephone Encounter (Signed)
CY please advise on refill. Thanks. 

## 2015-09-28 NOTE — Telephone Encounter (Signed)
Ok to refill x 6 months 

## 2015-09-29 ENCOUNTER — Ambulatory Visit: Payer: Medicare Other | Admitting: Internal Medicine

## 2015-10-05 ENCOUNTER — Ambulatory Visit (INDEPENDENT_AMBULATORY_CARE_PROVIDER_SITE_OTHER): Payer: Medicare Other | Admitting: Internal Medicine

## 2015-10-05 ENCOUNTER — Encounter: Payer: Self-pay | Admitting: Internal Medicine

## 2015-10-05 VITALS — BP 110/60 | HR 86 | Ht 66.0 in | Wt 140.4 lb

## 2015-10-05 DIAGNOSIS — IMO0001 Reserved for inherently not codable concepts without codable children: Secondary | ICD-10-CM

## 2015-10-05 DIAGNOSIS — J449 Chronic obstructive pulmonary disease, unspecified: Secondary | ICD-10-CM

## 2015-10-05 DIAGNOSIS — F5104 Psychophysiologic insomnia: Secondary | ICD-10-CM

## 2015-10-05 DIAGNOSIS — G4733 Obstructive sleep apnea (adult) (pediatric): Secondary | ICD-10-CM | POA: Diagnosis not present

## 2015-10-05 DIAGNOSIS — G4700 Insomnia, unspecified: Secondary | ICD-10-CM | POA: Diagnosis not present

## 2015-10-05 DIAGNOSIS — L4050 Arthropathic psoriasis, unspecified: Secondary | ICD-10-CM

## 2015-10-05 MED ORDER — FLUTICASONE FUROATE-VILANTEROL 100-25 MCG/INH IN AEPB: 1.0000 | INHALATION_SPRAY | Freq: Every day | RESPIRATORY_TRACT | Status: AC

## 2015-10-05 NOTE — Patient Instructions (Addendum)
Ok to continue temazepam for sleep as you are doing  You could try using otc Zzquill when you don't have temazepam  Sample and script printed Breo 100   Inhale 1 puff then rinse mouth, once daily.  Try this instead of Symbicort.  Please call as needed

## 2015-10-05 NOTE — Progress Notes (Signed)
Patient ID: Johnny Patrick, male    DOB: 10-13-1945, 70 y.o.   MRN: [id number]  HPI 08/23/10-65 yoM former smoker followed for COPD with hx cryptococcosis, OSA, allergic rhinitis, hx lung nodule, GERD, psoriatic arthritis, degenerative disk disease Last here February 20, 2010 after rods removed from spine. Continues f/u for anemia of chronic disease.  Since last here chronic cough has largely resolved.  Breathing now feels normal w/o cough, wheeze or shortness of breath. He does tire easily and is unsteady with rods out, so uses cart to ride at store. Discussed last CXR 6/ 2011  02/24/11- 65 yoM former smoker followed for COPD with hx cryptococcosis, OSA, allergic rhinitis, hx lung nodule, GERD, psoriatic arthritis, degenerative disk disease He has had flu vaccine. He reports being comfortable with CPAP at 12/[facility] and able to use it all night every night with good control. Since last here he had right hip replacement with no problems on September 25. He describes nonspecific pains across his low back and in the low back muscles which do not affect his breathing. He denies lung or respiratory discomfort and had no respiratory problems with his hip surgery. He was seen by Dr. [name] for infectious disease because of left arm nodules/cryptococcosis. Chest x-ray 08/23/2010-no active cardiopulmonary disease. Removal of thoracic rods, mild compression fractures and kyphosis. He had used his rescue inhaler just before coming today. He has continued to use temazepam for insomnia. Ambien used in hospital worked about the same.  09/16/11-  65 yoM former smoker followed for COPD with hx cryptococcosis, OSA, allergic rhinitis, hx lung nodule, GERD, psoriatic arthritis, degenerative disk disease  Denies sob, cough, congestion, and wheezing His COPD assessment test (CAT) score today is 4/40 He denies active shortness of breath or wheeze given his very limited exercise capability. He had a  cellulitis in his left foot requiring intravenous Augmentin through a PICC line. Cryptococcosis infection on the right arm resolved under Dr. Moshe Cipro care. Obstructive sleep apnea-he continues CPAP 12/[facility] and says he is able to use it all night every night and that it helps his sleep. Chronic difficulty initiating and maintaining sleep/insomnia does well with occasional temazepam.  03/27/12- 65 yoM former smoker followed for COPD with hx cryptococcosis, OSA, allergic rhinitis, hx lung nodule, GERD, psoriatic arthritis, degenerative disk disease FOLLOWS [id number] CPAP12/ [facility] some nights-about 1 time a month. He has not felt the need since he lost weight for years ago. Family does not tell him he snores. He admits an occasional nap but does not fight sleepiness. COPD has been symptomatically stable. Rare wheeze. He is curious about advertisements for Spiriva which we discussed. We discussed anemia as a basis for dyspnea. His hemoglobin stays around 11/ [name]. CXR 6/13 /12 IMPRESSION:  1. No acute cardiopulmonary disease.  2. Removal of thoracic posterior rod and pedicle screw fixation  apparatus.  3. Mid thoracic compression fractures and exaggerated thoracic  kyphosis.  Original Report Authenticated By: Andreas Newport, M.D.  09/25/12- 28 yoM former smoker followed for COPD with hx cryptococcosis, OSA/ stopped CPAP, allergic rhinitis, hx lung nodule, GERD, psoriatic arthritis, degenerative disk disease FOLLOWS FOR:has stopped CPAP all together- "not needed"; doing great-denies any snoring or sleep troubles; denies any wheezing, SOB, cough, or congestion as well. Tried Spiriva but prefers  Symbicort.  09/27/13- 70 yoM former smoker followed for COPD with hx cryptococcosis, OSA/ stopped CPAP, allergic rhinitis, hx lung nodule, GERD, psoriatic arthritis, degenerative disk disease FOLLOWS FOR:  Pt reports  breathing doing well and has no concerns Had left BKA  for nonhealing osteomyelitis.  Denies cough or wheeze  09/29/14- 69 yoM former smoker followed for COPD with hx cryptococcosis, OSA/ stopped CPAP, allergic rhinitis, hx lung nodule, insomnia, complicated by GERD, psoriatic arthritis, degenerative disk disease, LBKA/osteomyelitis FOLLOWS FOR: Pt denies any trouble with SOB, wheezing, cough, or congestion. Feeling well at this visit as he continues prednisone 5 mg daily for arthritis. Using Symbicort twice daily for intervals when he needs it and denies need for a rescue inhaler. Insomnia is well controlled with temazepam CXR 09/27/13 IMPRESSION: No acute cardiopulmonary disease. Electronically Signed  By: Amie Portland M.D.  On: 09/27/2013 14:55  10/05/2015-70 year old male former smoker followed for COPD, history cryptococcosis, OSA/stopped CPAP, allergic rhinitis, history lung nodule, insomnia, complicated by GERD, psoriatic arthritis, degenerative disc disease, LBKA/ osteomyelitis FOLLOW FOR: Discuss Temazepam and Symbicort.  Doing well. no other concerns. He resolved chest cold last month and feels at baseline now with no acute concerns. Insurance only pays for 45 temazepam capsules for each 60 days-discussed Symbicort has gotten more expensive-coverage changed   Review of Systems-see HPI Constitutional:   No-   weight loss, night sweats, fevers, chills, fatigue, lassitude. HEENT:   No-  headaches, difficulty swallowing, tooth/dental problems, sore throat,       No-  sneezing, itching, ear ache, nasal congestion, post nasal drip,  CV:  No-   chest pain, orthopnea, PND, swelling in lower extremities, anasarca, dizziness, palpitations Resp: No- acute shortness of breath with his limited exertion or at rest.              No-   productive cough,  No non-productive cough,  No- coughing up of blood.              No-   change in color of mucus.  Little wheezing.   Skin: No-   rash or lesions. GI:  No-   heartburn, indigestion, abdominal  pain, nausea, vomiting,  GU:  MS:  No-significant change in  joint pain or swelling, +- back pain.  Neuro-     nothing unusual Psych:  No- change in mood or affect. No depression or anxiety.  No memory loss.  Objective:   Physical Exam General- Alert, Oriented, Affect-appropriate, Distress- none acute. Chronically ill-appearing man  Skin- rash-none, lesions- none, excoriation- none Lymphadenopathy- none Head- atraumatic            Eyes- Gross vision intact, PERRLA, conjunctivae clear secretions            Ears- clear            Nose- Clear, no-Septal dev, mucus, polyps, erosion, perforation             Throat- Mallampati II, mucosa clear, no- post-nasal drip, tonsils- atrophic.  Neck- flexible , trachea midline, no stridor , thyroid nl, carotid no bruit Chest - symmetrical excursion , unlabored           Heart/CV- RRR , no murmur , no gallop  , no rub, nl s1 s2                           - JVD- none , edema- none, stasis changes- none, varices- none           Lung-  unlabored,  + few crackles, wheeze- none, cough- none , dullness-none, rub- none           Chest wall-  Abd- Br/ Gen/ Rectal- Not done, not indicated Extrem- cyanosis- none, clubbing, none, atrophy- none, strength-not assessed. +S/P L BKA Neuro- grossly intact to observation

## 2015-10-08 NOTE — Assessment & Plan Note (Signed)
He continues with long-term rheumatology care

## 2015-10-08 NOTE — Assessment & Plan Note (Signed)
We emphasized good sleep hygiene and realistic expectations, minimizing emotional dependence on sleep medication. It may help him to use occasional Tylenol PM

## 2015-10-08 NOTE — Assessment & Plan Note (Signed)
He failed to tolerate CPAP and did not seek alternatives.

## 2015-10-08 NOTE — Assessment & Plan Note (Signed)
We discussed alternatives to Symbicort Plan-sample and prescription Breo 100 to see if that is cheaper for him than Symbicort. He will check his formulary for alternatives

## 2015-11-28 DIAGNOSIS — R52 Pain, unspecified: Secondary | ICD-10-CM | POA: Diagnosis not present

## 2015-11-28 DIAGNOSIS — M4722 Other spondylosis with radiculopathy, cervical region: Secondary | ICD-10-CM | POA: Diagnosis not present

## 2015-11-28 DIAGNOSIS — M47816 Spondylosis without myelopathy or radiculopathy, lumbar region: Secondary | ICD-10-CM | POA: Diagnosis not present

## 2015-11-30 DIAGNOSIS — E039 Hypothyroidism, unspecified: Secondary | ICD-10-CM | POA: Diagnosis not present

## 2015-11-30 DIAGNOSIS — L4050 Arthropathic psoriasis, unspecified: Secondary | ICD-10-CM | POA: Diagnosis not present

## 2015-11-30 DIAGNOSIS — Z79899 Other long term (current) drug therapy: Secondary | ICD-10-CM | POA: Diagnosis not present

## 2015-12-06 DIAGNOSIS — I10 Essential (primary) hypertension: Secondary | ICD-10-CM | POA: Diagnosis not present

## 2015-12-06 DIAGNOSIS — E039 Hypothyroidism, unspecified: Secondary | ICD-10-CM | POA: Diagnosis not present

## 2015-12-06 DIAGNOSIS — E119 Type 2 diabetes mellitus without complications: Secondary | ICD-10-CM | POA: Diagnosis not present

## 2015-12-06 DIAGNOSIS — Z0000 Encounter for general adult medical examination without abnormal findings: Secondary | ICD-10-CM | POA: Diagnosis not present

## 2015-12-13 DIAGNOSIS — E119 Type 2 diabetes mellitus without complications: Secondary | ICD-10-CM | POA: Diagnosis not present

## 2015-12-13 DIAGNOSIS — J449 Chronic obstructive pulmonary disease, unspecified: Secondary | ICD-10-CM | POA: Diagnosis not present

## 2015-12-13 DIAGNOSIS — E782 Mixed hyperlipidemia: Secondary | ICD-10-CM | POA: Diagnosis not present

## 2015-12-13 DIAGNOSIS — E039 Hypothyroidism, unspecified: Secondary | ICD-10-CM | POA: Diagnosis not present

## 2015-12-14 DIAGNOSIS — M9971 Connective tissue and disc stenosis of intervertebral foramina of cervical region: Secondary | ICD-10-CM | POA: Diagnosis not present

## 2015-12-14 DIAGNOSIS — M4722 Other spondylosis with radiculopathy, cervical region: Secondary | ICD-10-CM | POA: Diagnosis not present

## 2015-12-14 DIAGNOSIS — M545 Low back pain: Secondary | ICD-10-CM | POA: Diagnosis not present

## 2015-12-14 DIAGNOSIS — M50222 Other cervical disc displacement at C5-C6 level: Secondary | ICD-10-CM | POA: Diagnosis not present

## 2015-12-14 DIAGNOSIS — M50221 Other cervical disc displacement at C4-C5 level: Secondary | ICD-10-CM | POA: Diagnosis not present

## 2015-12-14 DIAGNOSIS — M50223 Other cervical disc displacement at C6-C7 level: Secondary | ICD-10-CM | POA: Diagnosis not present

## 2015-12-14 DIAGNOSIS — M47816 Spondylosis without myelopathy or radiculopathy, lumbar region: Secondary | ICD-10-CM | POA: Diagnosis not present

## 2015-12-29 DIAGNOSIS — M4722 Other spondylosis with radiculopathy, cervical region: Secondary | ICD-10-CM | POA: Diagnosis not present

## 2015-12-29 DIAGNOSIS — M47816 Spondylosis without myelopathy or radiculopathy, lumbar region: Secondary | ICD-10-CM | POA: Diagnosis not present

## 2015-12-29 DIAGNOSIS — R52 Pain, unspecified: Secondary | ICD-10-CM | POA: Diagnosis not present

## 2016-01-24 DIAGNOSIS — Z23 Encounter for immunization: Secondary | ICD-10-CM | POA: Diagnosis not present

## 2016-02-26 DIAGNOSIS — L4050 Arthropathic psoriasis, unspecified: Secondary | ICD-10-CM | POA: Diagnosis not present

## 2016-03-13 DIAGNOSIS — E782 Mixed hyperlipidemia: Secondary | ICD-10-CM | POA: Diagnosis not present

## 2016-03-13 DIAGNOSIS — E119 Type 2 diabetes mellitus without complications: Secondary | ICD-10-CM | POA: Diagnosis not present

## 2016-03-20 DIAGNOSIS — E119 Type 2 diabetes mellitus without complications: Secondary | ICD-10-CM | POA: Diagnosis not present

## 2016-03-20 DIAGNOSIS — E782 Mixed hyperlipidemia: Secondary | ICD-10-CM | POA: Diagnosis not present

## 2016-03-20 DIAGNOSIS — R197 Diarrhea, unspecified: Secondary | ICD-10-CM | POA: Diagnosis not present

## 2016-03-20 DIAGNOSIS — E039 Hypothyroidism, unspecified: Secondary | ICD-10-CM | POA: Diagnosis not present

## 2016-03-22 ENCOUNTER — Other Ambulatory Visit: Payer: Self-pay | Admitting: Internal Medicine

## 2016-03-22 NOTE — Telephone Encounter (Signed)
CY Please advise on refill. Thanks.  

## 2016-03-25 NOTE — Telephone Encounter (Signed)
Ok to refill x 6 months 

## 2016-05-28 DIAGNOSIS — L4050 Arthropathic psoriasis, unspecified: Secondary | ICD-10-CM | POA: Diagnosis not present

## 2016-05-28 DIAGNOSIS — E039 Hypothyroidism, unspecified: Secondary | ICD-10-CM | POA: Diagnosis not present

## 2016-05-28 DIAGNOSIS — Z79899 Other long term (current) drug therapy: Secondary | ICD-10-CM | POA: Diagnosis not present

## 2016-08-07 DIAGNOSIS — E119 Type 2 diabetes mellitus without complications: Secondary | ICD-10-CM | POA: Diagnosis not present

## 2016-08-07 DIAGNOSIS — E782 Mixed hyperlipidemia: Secondary | ICD-10-CM | POA: Diagnosis not present

## 2016-08-14 DIAGNOSIS — E119 Type 2 diabetes mellitus without complications: Secondary | ICD-10-CM | POA: Diagnosis not present

## 2016-08-14 DIAGNOSIS — E039 Hypothyroidism, unspecified: Secondary | ICD-10-CM | POA: Diagnosis not present

## 2016-08-14 DIAGNOSIS — I10 Essential (primary) hypertension: Secondary | ICD-10-CM | POA: Diagnosis not present

## 2016-08-26 DIAGNOSIS — M1990 Unspecified osteoarthritis, unspecified site: Secondary | ICD-10-CM | POA: Diagnosis not present

## 2016-08-26 DIAGNOSIS — Z79899 Other long term (current) drug therapy: Secondary | ICD-10-CM | POA: Diagnosis not present

## 2016-08-26 DIAGNOSIS — L4050 Arthropathic psoriasis, unspecified: Secondary | ICD-10-CM | POA: Diagnosis not present

## 2016-08-27 ENCOUNTER — Ambulatory Visit: Payer: Medicare Other | Admitting: Internal Medicine

## 2016-09-18 ENCOUNTER — Encounter: Payer: Self-pay | Admitting: Infectious Disease

## 2016-09-18 ENCOUNTER — Ambulatory Visit (INDEPENDENT_AMBULATORY_CARE_PROVIDER_SITE_OTHER): Payer: Medicare Other | Admitting: Infectious Disease

## 2016-09-18 VITALS — BP 162/83 | HR 70 | Temp 98.8°F | Ht 66.0 in | Wt 139.0 lb

## 2016-09-18 DIAGNOSIS — T879 Unspecified complications of amputation stump: Secondary | ICD-10-CM | POA: Diagnosis not present

## 2016-09-18 DIAGNOSIS — M5441 Lumbago with sciatica, right side: Secondary | ICD-10-CM | POA: Diagnosis not present

## 2016-09-18 DIAGNOSIS — M5442 Lumbago with sciatica, left side: Secondary | ICD-10-CM | POA: Diagnosis not present

## 2016-09-18 DIAGNOSIS — E11621 Type 2 diabetes mellitus with foot ulcer: Secondary | ICD-10-CM

## 2016-09-18 DIAGNOSIS — L4050 Arthropathic psoriasis, unspecified: Secondary | ICD-10-CM | POA: Diagnosis not present

## 2016-09-18 DIAGNOSIS — E08621 Diabetes mellitus due to underlying condition with foot ulcer: Secondary | ICD-10-CM

## 2016-09-18 DIAGNOSIS — M009 Pyogenic arthritis, unspecified: Secondary | ICD-10-CM | POA: Diagnosis not present

## 2016-09-18 DIAGNOSIS — L97524 Non-pressure chronic ulcer of other part of left foot with necrosis of bone: Secondary | ICD-10-CM

## 2016-09-18 DIAGNOSIS — L97509 Non-pressure chronic ulcer of other part of unspecified foot with unspecified severity: Secondary | ICD-10-CM

## 2016-09-18 DIAGNOSIS — G8929 Other chronic pain: Secondary | ICD-10-CM | POA: Diagnosis not present

## 2016-09-18 DIAGNOSIS — B459 Cryptococcosis, unspecified: Secondary | ICD-10-CM

## 2016-09-18 HISTORY — DX: Type 2 diabetes mellitus with foot ulcer: E11.621

## 2016-09-18 HISTORY — DX: Unspecified complications of amputation stump: T87.9

## 2016-09-18 NOTE — Progress Notes (Signed)
Subjective:    Reason for Consult: BKA stump inflammation  Referring Physician: Dr. Carloyn Manner   Patient ID: Johnny Patrick, male    DOB: [birth year], 71 y.o.   MRN: [id number]  HPI   71  year old Caucasian male whom I had not seen since 2014.  He had a very  complicated PMHS including prior cryptococcal infection of arm, psoriatic arthritis (prev on methotrexate and prednisone), prev L THR (02-2008), prev R foot MSSA infection with sepsis 2009. He subsequently developed infection in his L second toe (MRI no osteo) And her underwent amputation 09-08-09. Cx's MSSA.   He was then seen Feb 2012 with cellulits of his R hand after a presumed cut to his hand. He completed a course of doxy for this. MRI showed no osteo, abscess or myofasciitis. By 3-13 he had worsening of swelling, erythema in his hand. He was rec to go to the ED which he refused. He got another 20 days of doxy after this then 10 days of bactrim (completed 07-02-10). Was seen in ID clinic 07-11-10 and was started on 30 more days of doxy (he was given choice of PIC or PO). He was seen again in ID clinic May 2012 and was to be continued on doxy to August 2012. (he was seen by Dr. Johnnye Sima multiple times along this course). Patient developed swelling in his left ankle (where he has a fused joint) ARea  became edematous, and exquisitely tender to palpation. He began taking his home doxycycline. He was then seen by Joni Fears who saw him and tapped a fluid collection which showed no crystals multiple wbc predominantly PMNS >90%. Cell count was not done. He was switched to doxy and keflex and continued this thru following  Until re-evaluated by Dr. Durward Fortes with no improvement and changed to levaquin and doxycyline.   He had MRI done prior to visit with me which showed per Dr. Zigmund Daniel showed no abscess, no osteomyelitis, only minimal edema in tibia that he feels is c/w arthritis but not septic arthritis and fused joint.. He had ESR markedly elevated  above 100 (but could be due to his arthritis).   I had him admitted to the hospital where he was placed on IV vancomycin with rapid improvement in his erythema and pain per my colleague Dr. Linus Salmons who folllowed pt at [facility].   Pt had remaiend on vancomycin since with continued improvement in appearance of his leg. i SAW him in February 2013 with plans to observe him off of abx. However in interim he developed ankle swellin and restarted oral doxycline that he took for 2 to 3 weeks. Swelling improved at night. I saw him again in March 2013 and recommended observation off antibiotics which he did.   I had continued to have him OFF antibiotics and I saw him in October at which point in time he STILL had erythema and edema in his ankle.  Since then symptoms or redness and pain and swelling worsened in his left ankle and he ultimately restarted his doxy and saw Dr. Doran Durand (Dr. Beola Cord had moved to [place]). Dr Doran Durand performed MRI which per the pt showed osteomyelitis in the heel   Recommendation was for curative BKA and patient underwent curative below the knee amputation.   I had not seen him since then and he had been doing well but then developed some irritation at his stump site. He showed this to Dr. Carloyn Manner who offered to prescribe him antibiotics but the patient wanted to  be seen by me instead and was referred to me. The time he is anxious seen by [place] in clinic he has now had resolution of his erythema at the stump site. He was told by one of the representatives that makes his prosthesis that this largely had to do with the fit of his prosthesis that was causing irritation in his stump site   Past Medical History:  Diagnosis Date  . Anemia   . Anemia, chronic disease 10/14/2011  . BKA stump complication ([place]) 10/21/9676  . Chronic airway obstruction, not elsewhere classified   . Cryptococcus ([place]) 2008   left arm  . Esophageal reflux   . Hypothyroidism   . Low back pain   . MRSA (methicillin  resistant Staphylococcus aureus)   . MSSA (methicillin susceptible Staphylococcus aureus) septicemia ([place])    L 2nd toe-amputated June 2011  . Nephrolithiasis   . Osteoarthritis   . Psoriatic arthritis ([place])   . Psoriatic arthritis ([place]) 10/14/2011  . Shingles    x2  . Sleep apnea    cpap  . Unspecified essential hypertension     Past Surgical History:  Procedure Laterality Date  . BACK SURGERY     3 fusions, 4th - undo 3rd fusion  . THORACIC SPINE SURGERY    . TOE AMPUTATION    . TOE SURGERY  2009   little toe on right foot , bone removed  . TOTAL HIP ARTHROPLASTY     bilateral    Family History  Problem Relation Age of Onset  . Coronary artery disease Mother   . Pneumonia Mother   . Heart failure Mother   . Breast cancer Mother   . Psoriasis Father   . Coronary artery disease Brother   . Colon cancer Neg Hx   . Stomach cancer Neg Hx       Social History   Social History  . Marital status: Married    Spouse name: N/A  . Number of children: N/A  . Years of education: N/A   Occupational History  . retired Retired   Social History Main Topics  . Smoking status: Former Smoker    Types: Cigarettes    Quit date: 04/24/1991  . Smokeless tobacco: Never Used  . Alcohol use No  . Drug use: No  . Sexual activity: Not Asked   Other Topics Concern  . None   Social History Narrative  . None    Allergies  Allergen Reactions  . Amoxicillin-Pot Clavulanate     "liver problems" highly questionable because he was in septic shock  . Sulfur Rash     Current Outpatient Prescriptions:  .  Ascorbic Acid (VITAMIN C) 500 MG CHEW, Chew 500 mg by mouth daily.  , Disp: , Rfl:  .  calcium carbonate (CALCIUM 600) 600 MG TABS, Take 600 mg by mouth 3 (three) times daily.  , Disp: , Rfl:  .  carvedilol (COREG) 25 MG tablet, TAKE ONE TABLET TWICE DAILY, Disp: 180 tablet, Rfl: 3 .  Cholecalciferol 400 UNITS CAPS, Take 1 capsule by mouth daily. , Disp: , Rfl:  .   cyclobenzaprine (FLEXERIL) 10 MG tablet, Take 10 mg by mouth every 8 (eight) hours as needed. For muscle pain., Disp: , Rfl:  .  fluticasone furoate-vilanterol (BREO ELLIPTA) 100-25 MCG/INH AEPB, Inhale 1 puff into the lungs daily. Inhale 1 puff then rinse mouth, once daily, Disp: 60 each, Rfl: 12 .  folic acid (FOLVITE) 1 MG tablet, Take 1 mg by  mouth daily.  , Disp: , Rfl:  .  gabapentin (NEURONTIN) 100 MG capsule, Take 2 capsules (200 mg total) by mouth 3 (three) times daily., Disp: 180 capsule, Rfl: 3 .  fentaNYL (DURAGESIC - DOSED MCG/HR) 100 MCG/HR, Place 1 patch onto the skin every 3 (three) days.  , Disp: , Rfl:  .  furosemide (LASIX) 40 MG tablet, Take 40 mg by mouth daily., Disp: , Rfl:  .  HYDROmorphone (DILAUDID) 2 MG tablet, Take 2 mg by mouth every 4 (four) hours.  , Disp: , Rfl:  .  leflunomide (ARAVA) 10 MG tablet, Take 1 tablet by mouth daily., Disp: , Rfl:  .  levothyroxine (SYNTHROID, LEVOTHROID) 150 MCG tablet, Take 175 mcg by mouth daily. , Disp: , Rfl:  .  losartan (COZAAR) 50 MG tablet, Take 1 tablet (50 mg total) by mouth daily., Disp: 30 tablet, Rfl: 12 .  Multiple Vitamin (MULITIVITAMIN WITH MINERALS) TABS, Take 1 tablet by mouth daily., Disp: , Rfl:  .  Omega-3 Fatty Acids (FISH OIL) 1000 MG CAPS, Take 1 capsule by mouth 3 (three) times daily. , Disp: , Rfl:  .  oxyCODONE-acetaminophen (PERCOCET) 10-325 MG per tablet, Take 1-2 tablets by mouth every 6 hours prn, Disp: , Rfl:  .  predniSONE (DELTASONE) 5 MG tablet, Take 5 mg by mouth daily.  , Disp: , Rfl:  .  SYMBICORT 160-4.5 MCG/ACT inhaler, USE TWO PUFFS TWICE DAILY, Disp: 10.2 g, Rfl: 0 .  temazepam (RESTORIL) 15 MG capsule, TAKE 1 OR 2 CAPSULES AT BEDTIME AS NEEDED FOR SLEEP., Disp: 60 capsule, Rfl: 5 .  triamcinolone cream (KENALOG) 0.1 %, Apply topically., Disp: , Rfl:     Review of Systems  Constitutional: Negative for activity change, appetite change, chills, diaphoresis, fatigue, fever and unexpected weight  change.  HENT: Negative for congestion, rhinorrhea, sinus pressure, sneezing, sore throat and trouble swallowing.   Eyes: Negative for photophobia and visual disturbance.  Respiratory: Negative for cough, chest tightness, shortness of breath, wheezing and stridor.   Cardiovascular: Negative for chest pain, palpitations and leg swelling.  Gastrointestinal: Negative for abdominal distention, abdominal pain, anal bleeding, blood in stool, constipation, diarrhea, nausea and vomiting.  Genitourinary: Negative for difficulty urinating, dysuria, flank pain and hematuria.  Musculoskeletal: Negative for back pain, gait problem and myalgias.  Skin: Positive for wound. Negative for pallor and rash.  Neurological: Negative for dizziness, tremors, weakness and light-headedness.  Hematological: Negative for adenopathy. Does not bruise/bleed easily.  Psychiatric/Behavioral: Negative for agitation, behavioral problems, confusion, decreased concentration, dysphoric mood and sleep disturbance.       Objective:   Physical Exam  Constitutional: He is oriented to person, place, and time. He appears well-developed and well-nourished. No distress.  HENT:  Head: Normocephalic and atraumatic.  Mouth/Throat: Oropharynx is clear and moist.  Eyes: Conjunctivae and EOM are normal. Pupils are equal, round, and reactive to light.  Neck: Normal range of motion. Neck supple.  Cardiovascular: Normal rate, regular rhythm and normal heart sounds.   Pulmonary/Chest: Effort normal. No respiratory distress.  Abdominal: He exhibits no distension.  Musculoskeletal: He exhibits no tenderness.  Neurological: He is alert and oriented to person, place, and time. He exhibits normal muscle tone. Coordination normal.  Skin: Skin is warm and dry. He is not diaphoretic. No pallor.  Psychiatric: He has a normal mood and affect. His behavior is normal. Judgment and thought content normal.    Stump site 09/18/16:    Right foot with  scar laterally from  prior surgery 09/28/16, no DFU:            Assessment & Plan:  1) Stump site irritation: This has resolved and seems to have been due to a poor fit of his prosthesis.    2) Decubitus ulcer: This has resolved per patient was not examined today.   3) cryptococcal infection no evidence or recurrence.   #4 psoriatic arthritis is currently on medications for that including steroids and other immunomodulatory drugs. He is being followed closely by primary care and by rheumatology.  #5 chronic back pain with sciatica being managed by Dr. Carloyn Manner  #6 diabetic foot ulcer with osteomyelitis status post curative below the knee amputation on the left no evidence of osteoarthritis in the stump seems to be without evidence of overt infection.  I spent greater than 45 minutes with the patient including greater than 50% of time in face to face counsel of the patient re his stump site irritation his prior decubitus ulcer has good coccal infection is psoriatic arthritis his prior diabetic foot ulcer with osteomyelitis and in coordination of their care.

## 2016-11-12 DIAGNOSIS — M47816 Spondylosis without myelopathy or radiculopathy, lumbar region: Secondary | ICD-10-CM | POA: Diagnosis not present

## 2016-11-12 DIAGNOSIS — R52 Pain, unspecified: Secondary | ICD-10-CM | POA: Diagnosis not present

## 2016-11-26 DIAGNOSIS — L4050 Arthropathic psoriasis, unspecified: Secondary | ICD-10-CM | POA: Diagnosis not present

## 2016-11-26 DIAGNOSIS — M1990 Unspecified osteoarthritis, unspecified site: Secondary | ICD-10-CM | POA: Diagnosis not present

## 2016-11-26 DIAGNOSIS — M25559 Pain in unspecified hip: Secondary | ICD-10-CM | POA: Diagnosis not present

## 2016-11-26 DIAGNOSIS — Z79899 Other long term (current) drug therapy: Secondary | ICD-10-CM | POA: Diagnosis not present

## 2016-12-13 ENCOUNTER — Inpatient Hospital Stay (HOSPITAL_COMMUNITY): Payer: Medicare Other

## 2016-12-13 ENCOUNTER — Inpatient Hospital Stay (HOSPITAL_COMMUNITY)
Admission: EM | Admit: 2016-12-13 | Discharge: 2016-12-16 | DRG: 871 | Disposition: A | Discharge status: Home / Self Care | Payer: Medicare Other | Attending: Internal Medicine | Admitting: Internal Medicine

## 2016-12-13 ENCOUNTER — Encounter (HOSPITAL_COMMUNITY): Payer: Self-pay | Admitting: *Deleted

## 2016-12-13 ENCOUNTER — Emergency Department (HOSPITAL_COMMUNITY): Payer: Medicare Other

## 2016-12-13 DIAGNOSIS — Z87891 Personal history of nicotine dependence: Secondary | ICD-10-CM | POA: Diagnosis not present

## 2016-12-13 DIAGNOSIS — Z87442 Personal history of urinary calculi: Secondary | ICD-10-CM

## 2016-12-13 DIAGNOSIS — G8929 Other chronic pain: Secondary | ICD-10-CM | POA: Diagnosis present

## 2016-12-13 DIAGNOSIS — I10 Essential (primary) hypertension: Secondary | ICD-10-CM | POA: Diagnosis present

## 2016-12-13 DIAGNOSIS — Z79899 Other long term (current) drug therapy: Secondary | ICD-10-CM

## 2016-12-13 DIAGNOSIS — Z8249 Family history of ischemic heart disease and other diseases of the circulatory system: Secondary | ICD-10-CM

## 2016-12-13 DIAGNOSIS — Z7951 Long term (current) use of inhaled steroids: Secondary | ICD-10-CM | POA: Diagnosis not present

## 2016-12-13 DIAGNOSIS — R4182 Altered mental status, unspecified: Secondary | ICD-10-CM | POA: Diagnosis not present

## 2016-12-13 DIAGNOSIS — J188 Other pneumonia, unspecified organism: Secondary | ICD-10-CM | POA: Diagnosis not present

## 2016-12-13 DIAGNOSIS — J9601 Acute respiratory failure with hypoxia: Secondary | ICD-10-CM | POA: Diagnosis not present

## 2016-12-13 DIAGNOSIS — M545 Low back pain, unspecified: Secondary | ICD-10-CM | POA: Diagnosis present

## 2016-12-13 DIAGNOSIS — Z89519 Acquired absence of unspecified leg below knee: Secondary | ICD-10-CM

## 2016-12-13 DIAGNOSIS — Z7952 Long term (current) use of systemic steroids: Secondary | ICD-10-CM | POA: Diagnosis not present

## 2016-12-13 DIAGNOSIS — G9341 Metabolic encephalopathy: Secondary | ICD-10-CM | POA: Diagnosis present

## 2016-12-13 DIAGNOSIS — K219 Gastro-esophageal reflux disease without esophagitis: Secondary | ICD-10-CM | POA: Diagnosis present

## 2016-12-13 DIAGNOSIS — E039 Hypothyroidism, unspecified: Secondary | ICD-10-CM | POA: Diagnosis present

## 2016-12-13 DIAGNOSIS — A419 Sepsis, unspecified organism: Secondary | ICD-10-CM | POA: Diagnosis not present

## 2016-12-13 DIAGNOSIS — M069 Rheumatoid arthritis, unspecified: Secondary | ICD-10-CM

## 2016-12-13 DIAGNOSIS — J440 Chronic obstructive pulmonary disease with acute lower respiratory infection: Secondary | ICD-10-CM | POA: Diagnosis present

## 2016-12-13 DIAGNOSIS — Z96643 Presence of artificial hip joint, bilateral: Secondary | ICD-10-CM | POA: Diagnosis present

## 2016-12-13 DIAGNOSIS — R0902 Hypoxemia: Secondary | ICD-10-CM | POA: Diagnosis not present

## 2016-12-13 DIAGNOSIS — Z882 Allergy status to sulfonamides status: Secondary | ICD-10-CM

## 2016-12-13 DIAGNOSIS — J181 Lobar pneumonia, unspecified organism: Secondary | ICD-10-CM | POA: Diagnosis present

## 2016-12-13 DIAGNOSIS — E876 Hypokalemia: Secondary | ICD-10-CM | POA: Diagnosis not present

## 2016-12-13 DIAGNOSIS — J189 Pneumonia, unspecified organism: Secondary | ICD-10-CM | POA: Diagnosis present

## 2016-12-13 DIAGNOSIS — G4730 Sleep apnea, unspecified: Secondary | ICD-10-CM | POA: Diagnosis present

## 2016-12-13 DIAGNOSIS — E119 Type 2 diabetes mellitus without complications: Secondary | ICD-10-CM | POA: Diagnosis present

## 2016-12-13 DIAGNOSIS — Z880 Allergy status to penicillin: Secondary | ICD-10-CM

## 2016-12-13 DIAGNOSIS — R0602 Shortness of breath: Secondary | ICD-10-CM | POA: Diagnosis not present

## 2016-12-13 DIAGNOSIS — L4050 Arthropathic psoriasis, unspecified: Secondary | ICD-10-CM | POA: Diagnosis present

## 2016-12-13 DIAGNOSIS — R6520 Severe sepsis without septic shock: Secondary | ICD-10-CM | POA: Diagnosis not present

## 2016-12-13 LAB — BLOOD GAS, ARTERIAL
Acid-Base Excess: 3.4 mmol/L — ABNORMAL HIGH (ref 0.0–2.0)
Bicarbonate: 28.4 mmol/L — ABNORMAL HIGH (ref 20.0–28.0)
DRAWN BY: 422461
FIO2: 100
O2 CONTENT: 15 L/min
O2 SAT: 98.2 %
PATIENT TEMPERATURE: 98.6
PCO2 ART: 47.8 mmHg (ref 32.0–48.0)
PO2 ART: 185 mmHg — AB (ref 83.0–108.0)
pH, Arterial: 7.392 (ref 7.350–7.450)

## 2016-12-13 LAB — CBC WITH DIFFERENTIAL/PLATELET
BASOS ABS: 0 10*3/uL (ref 0.0–0.1)
BASOS PCT: 0 %
EOS ABS: 0.2 10*3/uL (ref 0.0–0.7)
Eosinophils Relative: 2 %
HEMATOCRIT: 31.4 % — AB (ref 39.0–52.0)
Hemoglobin: 10 g/dL — ABNORMAL LOW (ref 13.0–17.0)
LYMPHS PCT: 8 %
Lymphs Abs: 0.9 10*3/uL (ref 0.7–4.0)
MCH: 28.7 pg (ref 26.0–34.0)
MCHC: 31.8 g/dL (ref 30.0–36.0)
MCV: 90.2 fL (ref 78.0–100.0)
MONO ABS: 0.4 10*3/uL (ref 0.1–1.0)
Monocytes Relative: 4 %
NEUTROS ABS: 9.1 10*3/uL — AB (ref 1.7–7.7)
Neutrophils Relative %: 86 %
PLATELETS: 217 10*3/uL (ref 150–400)
RBC: 3.48 MIL/uL — ABNORMAL LOW (ref 4.22–5.81)
RDW: 15.6 % — AB (ref 11.5–15.5)
WBC: 10.6 10*3/uL — ABNORMAL HIGH (ref 4.0–10.5)

## 2016-12-13 LAB — COMPREHENSIVE METABOLIC PANEL
ALBUMIN: 2.9 g/dL — AB (ref 3.5–5.0)
ALT: 17 U/L (ref 17–63)
AST: 30 U/L (ref 15–41)
Alkaline Phosphatase: 102 U/L (ref 38–126)
Anion gap: 8 (ref 5–15)
BUN: 16 mg/dL (ref 6–20)
CHLORIDE: 102 mmol/L (ref 101–111)
CO2: 30 mmol/L (ref 22–32)
Calcium: 9.4 mg/dL (ref 8.9–10.3)
Creatinine, Ser: 0.63 mg/dL (ref 0.61–1.24)
GFR calc Af Amer: >60 mL/min (ref >60)
GFR calc non Af Amer: >60 mL/min (ref >60)
GLUCOSE: 178 mg/dL — AB (ref 65–99)
POTASSIUM: 4.1 mmol/L (ref 3.5–5.1)
Sodium: 140 mmol/L (ref 135–145)
Total Bilirubin: 0.7 mg/dL (ref 0.3–1.2)
Total Protein: 6.4 g/dL — ABNORMAL LOW (ref 6.5–8.1)

## 2016-12-13 LAB — URINALYSIS, ROUTINE W REFLEX MICROSCOPIC
Bilirubin Urine: NEGATIVE
GLUCOSE, UA: NEGATIVE mg/dL
Hgb urine dipstick: NEGATIVE
KETONES UR: NEGATIVE mg/dL
LEUKOCYTES UA: NEGATIVE
NITRITE: NEGATIVE
PROTEIN: NEGATIVE mg/dL
Specific Gravity, Urine: 1.021 (ref 1.005–1.030)
pH: 5 (ref 5.0–8.0)

## 2016-12-13 LAB — MRSA PCR SCREENING: MRSA BY PCR: NEGATIVE

## 2016-12-13 LAB — GLUCOSE, CAPILLARY
GLUCOSE-CAPILLARY: 187 mg/dL — AB (ref 65–99)
GLUCOSE-CAPILLARY: 179 mg/dL — AB (ref 65–99)

## 2016-12-13 LAB — I-STAT CG4 LACTIC ACID, ED
LACTIC ACID, VENOUS: 1.74 mmol/L (ref 0.5–1.9)
LACTIC ACID, VENOUS: 1.03 mmol/L (ref 0.5–1.9)

## 2016-12-13 LAB — PROTIME-INR
INR: 0.98
PROTHROMBIN TIME: 12.9 s (ref 11.4–15.2)

## 2016-12-13 LAB — TSH: TSH: 0.83 u[IU]/mL (ref 0.350–4.500)

## 2016-12-13 MED ORDER — AZITHROMYCIN 500 MG IV SOLR
500.0000 mg | INTRAVENOUS | Status: DC
  Administered 2016-12-14 – 2016-12-15 (×2): 500 mg via INTRAVENOUS
  Filled 2016-12-13 (×2): qty 500

## 2016-12-13 MED ORDER — SODIUM CHLORIDE 0.9 % IV SOLN
INTRAVENOUS | Status: DC
  Administered 2016-12-13: 10:00:00 via INTRAVENOUS

## 2016-12-13 MED ORDER — ACETAMINOPHEN 650 MG RE SUPP
RECTAL | Status: AC
  Administered 2016-12-13: 650 mg via RECTAL
  Filled 2016-12-13: qty 1

## 2016-12-13 MED ORDER — ACETAMINOPHEN 650 MG RE SUPP
650.0000 mg | Freq: Once | RECTAL | Status: AC
  Administered 2016-12-13: 650 mg via RECTAL

## 2016-12-13 MED ORDER — LEVOTHYROXINE SODIUM 25 MCG PO TABS
175.0000 ug | ORAL_TABLET | Freq: Every day | ORAL | Status: DC
  Administered 2016-12-13 – 2016-12-16 (×4): 175 ug via ORAL
  Filled 2016-12-13 (×4): qty 1

## 2016-12-13 MED ORDER — POTASSIUM CHLORIDE CRYS ER 20 MEQ PO TBCR
20.0000 meq | EXTENDED_RELEASE_TABLET | Freq: Every day | ORAL | Status: DC
  Administered 2016-12-13 – 2016-12-16 (×4): 20 meq via ORAL
  Filled 2016-12-13 (×4): qty 1

## 2016-12-13 MED ORDER — SODIUM CHLORIDE 0.9 % IV BOLUS (SEPSIS)
1000.0000 mL | Freq: Once | INTRAVENOUS | Status: AC
  Administered 2016-12-13: 1000 mL via INTRAVENOUS

## 2016-12-13 MED ORDER — OXYCODONE-ACETAMINOPHEN 5-325 MG PO TABS
1.0000 | ORAL_TABLET | ORAL | Status: DC | PRN
  Administered 2016-12-13 – 2016-12-14 (×4): 1 via ORAL
  Filled 2016-12-13 (×4): qty 1

## 2016-12-13 MED ORDER — IPRATROPIUM-ALBUTEROL 0.5-2.5 (3) MG/3ML IN SOLN
3.0000 mL | Freq: Four times a day (QID) | RESPIRATORY_TRACT | Status: DC
  Administered 2016-12-13 (×2): 3 mL via RESPIRATORY_TRACT
  Filled 2016-12-13 (×2): qty 3

## 2016-12-13 MED ORDER — ACETAMINOPHEN 325 MG PO TABS: 650.0000 mg | ORAL_TABLET | Freq: Four times a day (QID) | ORAL | Status: DC | PRN

## 2016-12-13 MED ORDER — PREDNISONE 5 MG PO TABS
5.0000 mg | ORAL_TABLET | Freq: Every day | ORAL | Status: DC
  Administered 2016-12-13 – 2016-12-16 (×4): 5 mg via ORAL
  Filled 2016-12-13 (×4): qty 1

## 2016-12-13 MED ORDER — ACETAMINOPHEN 650 MG RE SUPP: 650.0000 mg | Freq: Four times a day (QID) | RECTAL | Status: DC | PRN

## 2016-12-13 MED ORDER — FLUTICASONE FUROATE-VILANTEROL 100-25 MCG/INH IN AEPB
1.0000 | INHALATION_SPRAY | Freq: Every day | RESPIRATORY_TRACT | Status: DC
  Administered 2016-12-13 – 2016-12-16 (×4): 1 via RESPIRATORY_TRACT
  Filled 2016-12-13: qty 28

## 2016-12-13 MED ORDER — OXYCODONE-ACETAMINOPHEN 5-325 MG PO TABS
2.0000 | ORAL_TABLET | Freq: Once | ORAL | Status: AC
  Administered 2016-12-14: 2 via ORAL
  Filled 2016-12-13: qty 2

## 2016-12-13 MED ORDER — LEFLUNOMIDE 20 MG PO TABS
20.0000 mg | ORAL_TABLET | Freq: Every day | ORAL | Status: DC
  Administered 2016-12-13 – 2016-12-16 (×4): 20 mg via ORAL
  Filled 2016-12-13 (×4): qty 1

## 2016-12-13 MED ORDER — AZITHROMYCIN 500 MG IV SOLR
500.0000 mg | Freq: Once | INTRAVENOUS | Status: AC
  Administered 2016-12-13: 500 mg via INTRAVENOUS
  Filled 2016-12-13: qty 500

## 2016-12-13 MED ORDER — ADULT MULTIVITAMIN W/MINERALS CH
1.0000 | ORAL_TABLET | Freq: Every day | ORAL | Status: DC
  Administered 2016-12-13 – 2016-12-16 (×4): 1 via ORAL
  Filled 2016-12-13 (×4): qty 1

## 2016-12-13 MED ORDER — CARVEDILOL 25 MG PO TABS
25.0000 mg | ORAL_TABLET | Freq: Two times a day (BID) | ORAL | Status: DC
  Administered 2016-12-13 – 2016-12-16 (×7): 25 mg via ORAL
  Filled 2016-12-13: qty 1
  Filled 2016-12-13 (×4): qty 2
  Filled 2016-12-13 (×2): qty 1

## 2016-12-13 MED ORDER — DEXTROSE 5 % IV SOLN
1.0000 g | INTRAVENOUS | Status: DC
  Administered 2016-12-14 – 2016-12-16 (×3): 1 g via INTRAVENOUS
  Filled 2016-12-13 (×3): qty 10

## 2016-12-13 MED ORDER — ONDANSETRON HCL 4 MG/2ML IJ SOLN
4.0000 mg | Freq: Four times a day (QID) | INTRAMUSCULAR | Status: DC | PRN
  Administered 2016-12-14 – 2016-12-16 (×3): 4 mg via INTRAVENOUS
  Filled 2016-12-13 (×3): qty 2

## 2016-12-13 MED ORDER — VITAMIN C 500 MG PO TABS
500.0000 mg | ORAL_TABLET | Freq: Every day | ORAL | Status: DC
  Administered 2016-12-13 – 2016-12-16 (×4): 500 mg via ORAL
  Filled 2016-12-13 (×4): qty 1

## 2016-12-13 MED ORDER — ENOXAPARIN SODIUM 40 MG/0.4ML ~~LOC~~ SOLN
40.0000 mg | Freq: Every day | SUBCUTANEOUS | Status: DC
  Administered 2016-12-13 – 2016-12-16 (×4): 40 mg via SUBCUTANEOUS
  Filled 2016-12-13 (×4): qty 0.4

## 2016-12-13 MED ORDER — LOSARTAN POTASSIUM 50 MG PO TABS
50.0000 mg | ORAL_TABLET | Freq: Every day | ORAL | Status: DC
  Administered 2016-12-13 – 2016-12-16 (×4): 50 mg via ORAL
  Filled 2016-12-13 (×4): qty 1

## 2016-12-13 MED ORDER — DEXTROSE 5 % IV SOLN
1.0000 g | Freq: Once | INTRAVENOUS | Status: AC
  Administered 2016-12-13: 1 g via INTRAVENOUS
  Filled 2016-12-13: qty 10

## 2016-12-13 MED ORDER — ONDANSETRON HCL 4 MG PO TABS: 4.0000 mg | ORAL_TABLET | Freq: Four times a day (QID) | ORAL | Status: DC | PRN

## 2016-12-13 MED ORDER — HYDRALAZINE HCL 20 MG/ML IJ SOLN
10.0000 mg | Freq: Once | INTRAMUSCULAR | Status: AC
  Administered 2016-12-13: 10 mg via INTRAVENOUS
  Filled 2016-12-13: qty 1

## 2016-12-13 MED ORDER — GABAPENTIN 300 MG PO CAPS
300.0000 mg | ORAL_CAPSULE | Freq: Three times a day (TID) | ORAL | Status: DC
  Administered 2016-12-13 – 2016-12-16 (×9): 300 mg via ORAL
  Filled 2016-12-13 (×9): qty 1

## 2016-12-13 MED ORDER — FOLIC ACID 1 MG PO TABS
1.0000 mg | ORAL_TABLET | Freq: Every day | ORAL | Status: DC
  Administered 2016-12-13 – 2016-12-16 (×4): 1 mg via ORAL
  Filled 2016-12-13 (×4): qty 1

## 2016-12-13 MED ORDER — TEMAZEPAM 15 MG PO CAPS
15.0000 mg | ORAL_CAPSULE | Freq: Every evening | ORAL | Status: DC | PRN
  Administered 2016-12-13 – 2016-12-15 (×3): 15 mg via ORAL
  Filled 2016-12-13 (×3): qty 1

## 2016-12-13 MED ORDER — MOMETASONE FURO-FORMOTEROL FUM 200-5 MCG/ACT IN AERO: 2.0000 | INHALATION_SPRAY | Freq: Two times a day (BID) | RESPIRATORY_TRACT | Status: DC

## 2016-12-13 MED ORDER — IPRATROPIUM-ALBUTEROL 0.5-2.5 (3) MG/3ML IN SOLN
3.0000 mL | Freq: Three times a day (TID) | RESPIRATORY_TRACT | Status: DC
  Administered 2016-12-14 – 2016-12-16 (×8): 3 mL via RESPIRATORY_TRACT
  Filled 2016-12-13 (×8): qty 3

## 2016-12-13 MED ORDER — POLYETHYLENE GLYCOL 3350 17 G PO PACK: 17.0000 g | PACK | Freq: Every day | ORAL | Status: DC | PRN

## 2016-12-13 NOTE — ED Notes (Signed)
Per Hospitalist patient is now going to stepdown. Dahlia Client, RN on 5E has been notified of cancellation of bed.

## 2016-12-13 NOTE — ED Notes (Signed)
Patient denies pain and is resting comfortably.  

## 2016-12-13 NOTE — ED Provider Notes (Signed)
[facility]-EMERGENCY DEPT Provider Note   CSN: [id number] Arrival date & time: 12/13/16  0441     History   Chief Complaint Chief Complaint  Patient presents with  . Fever  . Respiratory Distress    HPI Johnny Patrick is a 71 y.o. male.  HPI  Pt with hx of COPD, Psoriatic arthritis on immunosuppressants and BKA comes in with cc of dib, weakness and confusion. Wife reports that pt was doing well during the day time, but he started declining in the evening. She noticed that pt was tired, his speech was more mumbled (off and on) and pt appeared confused when talking. Pt declined to come to the ER, so she watched him closely, and finally called 911.  Pt has no cough, but he does have chest pain when he takes a deep breath. PT denies fevers, chills. Pt has no headaches, abd pain, n/v/diarrhea, uti like symptoms.  Past Medical History:  Diagnosis Date  . Anemia   . Anemia, chronic disease 10/14/2011  . 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 Chronic airway obstruction, not elsewhere classified   . Cryptococcus (HCC) 2008   left arm  . Diabetic foot ulcer (HCC) 09/18/2016  . Esophageal reflux   . Hypothyroidism   . Low back pain   . MRSA (methicillin resistant Staphylococcus aureus)   . MSSA (methicillin susceptible Staphylococcus aureus) septicemia (HCC)    L 2nd toe-amputated June 2011  . Nephrolithiasis   . Osteoarthritis   . Psoriatic arthritis (HCC)   . Psoriatic arthritis (HCC) 10/14/2011  . Shingles    x2  . Sleep apnea    cpap  . Unspecified essential hypertension     Patient Active Problem List   Diagnosis Date Noted  . CAP (community acquired pneumonia) 12/13/2016  . Acute metabolic encephalopathy 12/13/2016  . Acute respiratory failure with hypoxia (HCC)   . Multifocal pneumonia   . 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 Diabetic foot ulcer (HCC) 09/18/2016  . Preoperative clearance 09/09/2012  . Arthritis, septic, ankle or foot (HCC) 08/19/2012  . Left  ankle swelling 01/22/2012  . Anemia, chronic disease 10/14/2011  . Rheumatoid arthritis (HCC) 10/14/2011  . Psoriatic arthritis (HCC) 10/14/2011  . Confusion caused by a drug (HCC) 06/20/2011  . HTN (hypertension) 05/09/2011  . Leg edema 04/24/2011  . Cellulitis, leg 04/24/2011  . Cellulitis and abscess of leg 04/24/2011  . Immunosuppression (HCC) 04/24/2011  . Insomnia, chronic 02/27/2011  . CELLULITIS, HAND, RIGHT 05/23/2010  . 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 LUNG NODULE 07/18/2009  . ABDOMINAL/PELVIC SWELLING MASS/LUMP UNSPEC SITE 07/18/2009  . HYPERLIPIDEMIA 06/30/2009  . DEGENERATIVE DISC DISEASE 06/30/2009  . HIP REPLACEMENT, LEFT, HX OF 06/30/2009  . OTHER POSTSURGICAL STATUS OTHER 06/30/2009  . FEVER UNSPECIFIED 06/14/2009  . ALLERGIC RHINITIS 08/02/2007  . CRYPTOCOCCOSIS 12/15/2006  . HYPOTHYROIDISM 12/15/2006  . Obstructive sleep apnea 12/15/2006  . HYPERTENSION 12/15/2006  . 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 GERD 12/15/2006  . PSORIATIC ARTHRITIS 12/15/2006  . OSTEOARTHRITIS 12/15/2006  . LOW BACK PAIN 12/15/2006  . Edema 12/15/2006  . NEPHROLITHIASIS, HX OF 12/15/2006  . Personal history of tobacco use, presenting hazards to health 12/15/2006    Past Surgical History:  Procedure Laterality Date  . BACK SURGERY     3 fusions, 4th - undo 3rd fusion  . THORACIC SPINE SURGERY    . TOE AMPUTATION    . TOE SURGERY  2009   little toe on right foot ,  bone removed  . TOTAL HIP ARTHROPLASTY     bilateral       Home Medications    Prior to Admission medications   Medication Sig Start Date End Date Taking? Authorizing Provider  Ascorbic Acid (VITAMIN C) 500 MG CHEW Chew 500 mg by mouth daily.     Yes [provider]  calcium carbonate (CALCIUM 600) 600 MG TABS Take 600 mg by mouth 3 (three) times daily.     Yes [provider]  carvedilol (COREG) 25 MG tablet TAKE ONE TABLET TWICE DAILY 09/27/12  Yes Wall, Jesse Sans, MD    cyclobenzaprine (FLEXERIL) 10 MG tablet Take 10 mg by mouth every 8 (eight) hours as needed. For muscle pain.   Yes [provider]  fluticasone furoate-vilanterol (BREO ELLIPTA) 100-25 MCG/INH AEPB Inhale 1 puff into the lungs daily. Inhale 1 puff then rinse mouth, once daily 10/05/15  Yes Young, Clinton D, MD  folic acid (FOLVITE) 1 MG tablet Take 1 mg by mouth daily.     Yes [provider]  gabapentin (NEURONTIN) 300 MG capsule Take 1 capsule by mouth 3 (three) times daily. 12/05/16  Yes [provider]  HYDROmorphone (DILAUDID) 2 MG tablet Take 2 mg by mouth every 4 (four) hours.     Yes [provider]  leflunomide (ARAVA) 10 MG tablet Take 20 mg by mouth daily.  09/21/14  Yes [provider]  levothyroxine (SYNTHROID, LEVOTHROID) 150 MCG tablet Take 175 mcg by mouth daily.    Yes [provider]  losartan (COZAAR) 50 MG tablet Take 1 tablet (50 mg total) by mouth daily. 09/01/12  Yes Wall, Jesse Sans, MD  Multiple Vitamin (MULITIVITAMIN WITH MINERALS) TABS Take 1 tablet by mouth daily.   Yes [provider]  Omega-3 Fatty Acids (FISH OIL) 1000 MG CAPS Take 1 capsule by mouth 3 (three) times daily.    Yes [provider]  oxyCODONE-acetaminophen (PERCOCET) 10-325 MG per tablet Take 1-2 tablets by mouth every 6 (six) hours as needed for pain.    Yes [provider]  potassium chloride SA (K-DUR,KLOR-CON) 20 MEQ tablet Take 20 mEq by mouth daily.   Yes [provider]  predniSONE (DELTASONE) 5 MG tablet Take 5 mg by mouth daily.     Yes [provider]  SYMBICORT 160-4.5 MCG/ACT inhaler USE TWO PUFFS TWICE DAILY 04/04/15  Yes Young, Clinton D, MD  temazepam (RESTORIL) 15 MG capsule TAKE 1 OR 2 CAPSULES AT BEDTIME AS NEEDED FOR SLEEP. 03/26/16  Yes Young, Clinton D, MD  triamcinolone cream (KENALOG) 0.1 % Apply topically.   Yes [provider]    Family History Family History  Problem  Relation Age of Onset  . Coronary artery disease Mother   . Pneumonia Mother   . Heart failure Mother   . Breast cancer Mother   . Psoriasis Father   . Coronary artery disease Brother   . Colon cancer Neg Hx   . Stomach cancer Neg Hx     Social History Social History  Substance Use Topics  . Smoking status: Former Smoker    Types: Cigarettes    Quit date: 04/24/1991  . Smokeless tobacco: Never Used  . Alcohol use No     Allergies   Amoxicillin-pot clavulanate and Sulfur   Review of Systems Review of Systems  Constitutional: Positive for activity change.  Respiratory: Positive for shortness of breath. Negative for cough.   Cardiovascular: Negative for chest pain.  Allergic/Immunologic:  Positive for immunocompromised state.  Neurological: Positive for weakness.  All other systems reviewed and are negative.    Physical Exam Updated Vital Signs BP (!) 167/88 (BP Location: Right Arm)   Pulse 87   Temp 98.6 F (37 C) (Oral)   Resp (!) 26   Ht 5\' 6"  (1.676 m)   Wt 63 kg (139 lb)   SpO2 98%   BMI 22.44 kg/m   Physical Exam  Constitutional: He is oriented to person, place, and time. He appears well-developed.  HENT:  Head: Atraumatic.  Neck: Neck supple.  Cardiovascular: Normal rate.   Pulmonary/Chest: Effort normal.  Rales on the R base and diminished aeration on the L side  Abdominal: Soft. There is no tenderness.  Neurological: He is alert and oriented to person, place, and time.  alert  Skin: Skin is warm.  Nursing note and vitals reviewed.    ED Treatments / Results  Labs (all labs ordered are listed, but only abnormal results are displayed) Labs Reviewed  COMPREHENSIVE METABOLIC PANEL - Abnormal; Notable for the following:       Result Value   Glucose, Bld 178 (*)    Total Protein 6.4 (*)    Albumin 2.9 (*)    All other components within normal limits  CBC WITH DIFFERENTIAL/PLATELET - Abnormal; Notable for the following:    WBC 10.6 (*)    RBC  3.48 (*)    Hemoglobin 10.0 (*)    HCT 31.4 (*)    RDW 15.6 (*)    Neutro Abs 9.1 (*)    All other components within normal limits  BLOOD GAS, ARTERIAL - Abnormal; Notable for the following:    pO2, Arterial 185 (*)    Bicarbonate 28.4 (*)    Acid-Base Excess 3.4 (*)    All other components within normal limits  GLUCOSE, CAPILLARY - Abnormal; Notable for the following:    Glucose-Capillary 187 (*)    All other components within normal limits  CULTURE, BLOOD (ROUTINE X 2)  CULTURE, BLOOD (ROUTINE X 2)  MRSA PCR SCREENING  PROTIME-INR  URINALYSIS, ROUTINE W REFLEX MICROSCOPIC  TSH  BASIC METABOLIC PANEL  CBC  I-STAT CG4 LACTIC ACID, ED  I-STAT CG4 LACTIC ACID, ED    EKG  EKG Interpretation None      Date: 12/13/2016  Rate: 84  Rhythm: normal sinus rhythm  QRS Axis: normal  Intervals: normal  ST/T Wave abnormalities: normal  Conduction Disutrbances: none  Narrative Interpretation: unremarkable, no significant new changes      Radiology Dg Chest 2 View  Result Date: 12/13/2016 CLINICAL DATA:  Increasing weakness, fever, and shortness of breath tonight. Possible code sepsis. EXAM: CHEST  2 VIEW COMPARISON:  09/27/2013 FINDINGS: Shallow inspiration. Mild cardiac enlargement. Bilateral perihilar and upper lung infiltrates. These could represent multifocal pneumonia or edema. Aspiration could potentially have this appearance in the appropriate clinical setting. No blunting of costophrenic angles. No pneumothorax. Calcified and tortuous aorta. Postoperative changes in the lumbar spine. Degenerative changes in the shoulders. IMPRESSION: Bilateral perihilar and upper lung infiltrates suggesting multifocal pneumonia or edema. Electronically Signed   By: 09/29/2013 M.D.   On: 12/13/2016 05:26   Ct Head Wo Contrast  Result Date: 12/13/2016 CLINICAL DATA:  Altered level consciousness. History of hypothyroidism, hypertension. EXAM: CT HEAD WITHOUT CONTRAST TECHNIQUE: Contiguous  axial images were obtained from the base of the skull through the vertex without intravenous contrast. COMPARISON:  10/26/2007 FINDINGS: Brain: No evidence of acute infarction, hemorrhage,  extra-axial collection, ventriculomegaly, or mass effect. Generalized cerebral atrophy. Periventricular white matter low attenuation likely secondary to microangiopathy. Vascular: Cerebrovascular atherosclerotic calcifications are noted. Skull: Negative for fracture or focal lesion. Sinuses/Orbits: Visualized portions of the orbits are unremarkable. Visualized portions of the paranasal sinuses and mastoid air cells are unremarkable. Other: None. IMPRESSION: 1. No acute intracranial pathology. 2. Chronic microvascular disease and cerebral atrophy. Electronically Signed   By: Elige Ko   On: 12/13/2016 08:50   CXR Imaging reviewed by me independently.  Procedures Procedures (including critical care time) CRITICAL CARE Performed by: Derwood Kaplan   Total critical care time: 41 minutes for severe sepsis  Critical care time was exclusive of separately billable procedures and treating other patients.  Critical care was necessary to treat or prevent imminent or life-threatening deterioration.  Critical care was time spent personally by me on the following activities: development of treatment plan with patient and/or surrogate as well as nursing, discussions with consultants, evaluation of patient's response to treatment, examination of patient, obtaining history from patient or surrogate, ordering and performing treatments and interventions, ordering and review of laboratory studies, ordering and review of radiographic studies, pulse oximetry and re-evaluation of patient's condition.   Medications Ordered in ED Medications  potassium chloride SA (K-DUR,KLOR-CON) CR tablet 20 mEq (20 mEq Oral Given 12/13/16 1100)  fluticasone furoate-vilanterol (BREO ELLIPTA) 100-25 MCG/INH 1 puff (1 puff Inhalation Given 12/13/16  1108)  leflunomide (ARAVA) tablet 20 mg (20 mg Oral Given 12/13/16 1100)  carvedilol (COREG) tablet 25 mg (25 mg Oral Given 12/13/16 1100)  multivitamin with minerals tablet 1 tablet (1 tablet Oral Given 12/13/16 1100)  vitamin C (ASCORBIC ACID) tablet 500 mg (500 mg Oral Given 12/13/16 1100)  folic acid (FOLVITE) tablet 1 mg (1 mg Oral Given 12/13/16 1100)  levothyroxine (SYNTHROID, LEVOTHROID) tablet 175 mcg (175 mcg Oral Given 12/13/16 1100)  predniSONE (DELTASONE) tablet 5 mg (5 mg Oral Given 12/13/16 1100)  enoxaparin (LOVENOX) injection 40 mg (40 mg Subcutaneous Given 12/13/16 1109)  acetaminophen (TYLENOL) tablet 650 mg (not administered)    Or  acetaminophen (TYLENOL) suppository 650 mg (not administered)  polyethylene glycol (MIRALAX / GLYCOLAX) packet 17 g (not administered)  ondansetron (ZOFRAN) tablet 4 mg (not administered)    Or  ondansetron (ZOFRAN) injection 4 mg (not administered)  azithromycin (ZITHROMAX) 500 mg in dextrose 5 % 250 mL IVPB (not administered)  cefTRIAXone (ROCEPHIN) 1 g in dextrose 5 % 50 mL IVPB (not administered)  gabapentin (NEURONTIN) capsule 300 mg (300 mg Oral Given 12/13/16 1516)  oxyCODONE-acetaminophen (PERCOCET/ROXICET) 5-325 MG per tablet 1 tablet (1 tablet Oral Given 12/13/16 1516)  losartan (COZAAR) tablet 50 mg (50 mg Oral Given 12/13/16 1738)  temazepam (RESTORIL) capsule 15 mg (not administered)  ipratropium-albuterol (DUONEB) 0.5-2.5 (3) MG/3ML nebulizer solution 3 mL (not administered)  acetaminophen (TYLENOL) suppository 650 mg (650 mg Rectal Given 12/13/16 0514)  acetaminophen (TYLENOL) 650 MG suppository (650 mg Rectal Given 12/13/16 0459)  sodium chloride 0.9 % bolus 1,000 mL (0 mLs Intravenous Stopped 12/13/16 0612)    And  sodium chloride 0.9 % bolus 1,000 mL (0 mLs Intravenous Stopped 12/13/16 0617)  cefTRIAXone (ROCEPHIN) 1 g in dextrose 5 % 50 mL IVPB (0 g Intravenous Stopped 12/13/16 0613)  azithromycin (ZITHROMAX) 500 mg in dextrose 5 % 250 mL IVPB (0 mg  Intravenous Stopped 12/13/16 0646)  hydrALAZINE (APRESOLINE) injection 10 mg (10 mg Intravenous Given 12/13/16 2020)     Initial Impression / Assessment and Plan / ED  Course  I have reviewed the triage vital signs and the nursing notes.  Pertinent labs & imaging results that were available during my care of the patient were reviewed by me and considered in my medical decision making (see chart for details).     Pt comes in with cc of weakness, hypoxia. CXR shows infiltrates on both lung lobes. PT is immunosuppressed, and we will start treating him has a CAP.  Pt had hypoxia on room air. With NRB he is better, and pt was placed on a nasal canula, but the O2 sats dropped to the 80s, so I have asked the RT to place the patient on ventimask with a goal O2 sats of 94%.  ABG is not showing hypercapnia. I think the slurring speech and increased confusion could be due to hypoxia.   Final Clinical Impressions(s) / ED Diagnoses   Final diagnoses:  Multifocal pneumonia  Acute respiratory failure with hypoxia [facility])    New Prescriptions Current Discharge Medication List       Derwood Kaplan, MD 12/13/16 2200

## 2016-12-13 NOTE — H&P (Signed)
History and Physical    Johnny Patrick FXT:[id number] DOB: November 14, 1945 DOA: 12/13/2016  PCP: Georgianne Fick, MD  Patient coming from:Home  Chief Complaint: Confusion/altered mental status  HPI: Johnny Patrick is a 71 y.o. male with medical history significant of hypertension, hypothyroidism, psoriatic arthritis on chronic prednisone, sepsis in the past, multiple back surgeries, presented with altered mental status confusion worsened since last night. Patient's wife and daughter reported that patient has been sluggishness and intermittent confusion for last 2-3 weeks. It was associated with slurred speech and difficulty to focus. Last night patient was not able to see properly associated with confusion and not answering appropriately. Family got concerned and brought to the patient to the hospital. Family initially thought that it could be due to pain medication. Family could not tell if he was taking extra pain pills. Patient was not complaining of headache, dizziness, nausea, vomiting, chest pain, shortness of breath. No fever or coughing. During my examination patient was alert and awake, oriented to hospital, September 2018 and name. He was sleepy.   ED Course: In the ER patient was found to have temperature of 101.5 sepsis and x-ray consistent with multifocal pneumonia. Cultures were sent. Treated with IV fluid, azithromycin and ceftriaxone. As per family members at bedside patient mental status somewhat better than yesterday. Patient required nonrebreather on arrival. Currently on 12 L of oxygen. He will be admitted for further evaluation.  Review of Systems: As per HPI otherwise 10 point review of systems negative.    Past Medical History:  Diagnosis Date  . Anemia   . Anemia, chronic disease 10/14/2011  . 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 Chronic airway obstruction, not elsewhere classified   . Cryptococcus (HCC) 2008   left arm  . Diabetic foot ulcer (HCC) 09/18/2016  .  Esophageal reflux   . Hypothyroidism   . Low back pain   . MRSA (methicillin resistant Staphylococcus aureus)   . MSSA (methicillin susceptible Staphylococcus aureus) septicemia (HCC)    L 2nd toe-amputated June 2011  . Nephrolithiasis   . Osteoarthritis   . Psoriatic arthritis (HCC)   . Psoriatic arthritis (HCC) 10/14/2011  . Shingles    x2  . Sleep apnea    cpap  . Unspecified essential hypertension     Past Surgical History:  Procedure Laterality Date  . BACK SURGERY     3 fusions, 4th - undo 3rd fusion  . THORACIC SPINE SURGERY    . TOE AMPUTATION    . TOE SURGERY  2009   little toe on right foot , bone removed  . TOTAL HIP ARTHROPLASTY     bilateral     Social history: reports that he quit smoking about 25 years ago. His smoking use included Cigarettes. He has never used smokeless tobacco. He reports that he does not drink alcohol or use drugs.  Allergies  Allergen Reactions  . Amoxicillin-Pot Clavulanate     "liver problems" highly questionable because he was in septic shock  . Sulfur Rash    Family History  Problem Relation Age of Onset  . Coronary artery disease Mother   . Pneumonia Mother   . Heart failure Mother   . Breast cancer Mother   . Psoriasis Father   . Coronary artery disease Brother   . Colon cancer Neg Hx   . Stomach cancer Neg Hx      Prior to Admission medications   Medication Sig Start Date End Date Taking? Authorizing Provider  Ascorbic  Acid (VITAMIN C) 500 MG CHEW Chew 500 mg by mouth daily.     Yes [provider]  calcium carbonate (CALCIUM 600) 600 MG TABS Take 600 mg by mouth 3 (three) times daily.     Yes [provider]  carvedilol (COREG) 25 MG tablet TAKE ONE TABLET TWICE DAILY 09/27/12  Yes Wall, Jesse Sans, MD  cyclobenzaprine (FLEXERIL) 10 MG tablet Take 10 mg by mouth every 8 (eight) hours as needed. For muscle pain.   Yes [provider]  fluticasone furoate-vilanterol (BREO ELLIPTA) 100-25 MCG/INH  AEPB Inhale 1 puff into the lungs daily. Inhale 1 puff then rinse mouth, once daily 10/05/15  Yes Young, Clinton D, MD  folic acid (FOLVITE) 1 MG tablet Take 1 mg by mouth daily.     Yes [provider]  gabapentin (NEURONTIN) 300 MG capsule Take 1 capsule by mouth 3 (three) times daily. 12/05/16  Yes [provider]  HYDROmorphone (DILAUDID) 2 MG tablet Take 2 mg by mouth every 4 (four) hours.     Yes [provider]  leflunomide (ARAVA) 10 MG tablet Take 20 mg by mouth daily.  09/21/14  Yes [provider]  levothyroxine (SYNTHROID, LEVOTHROID) 150 MCG tablet Take 175 mcg by mouth daily.    Yes [provider]  losartan (COZAAR) 50 MG tablet Take 1 tablet (50 mg total) by mouth daily. 09/01/12  Yes Wall, Jesse Sans, MD  Multiple Vitamin (MULITIVITAMIN WITH MINERALS) TABS Take 1 tablet by mouth daily.   Yes [provider]  Omega-3 Fatty Acids (FISH OIL) 1000 MG CAPS Take 1 capsule by mouth 3 (three) times daily.    Yes [provider]  oxyCODONE-acetaminophen (PERCOCET) 10-325 MG per tablet Take 1-2 tablets by mouth every 6 (six) hours as needed for pain.    Yes [provider]  potassium chloride SA (K-DUR,KLOR-CON) 20 MEQ tablet Take 20 mEq by mouth daily.   Yes [provider]  predniSONE (DELTASONE) 5 MG tablet Take 5 mg by mouth daily.     Yes [provider]  SYMBICORT 160-4.5 MCG/ACT inhaler USE TWO PUFFS TWICE DAILY 04/04/15  Yes Young, Clinton D, MD  temazepam (RESTORIL) 15 MG capsule TAKE 1 OR 2 CAPSULES AT BEDTIME AS NEEDED FOR SLEEP. 03/26/16  Yes Young, Clinton D, MD  triamcinolone cream (KENALOG) 0.1 % Apply topically.   Yes [provider]    Physical Exam: Vitals:   12/13/16 0645 12/13/16 0700 12/13/16 0715 12/13/16 0747  BP: 126/65 103/61 110/65 109/67  Pulse: 89 86 89 88  Resp: 13 14 15 15   Temp:      TempSrc:      SpO2: 100% 100% 100% 97%  Weight:      Height:           Constitutional: NAD, calm, comfortable Vitals:   12/13/16 0645 12/13/16 0700 12/13/16 0715 12/13/16 0747  BP: 126/65 103/61 110/65 109/67  Pulse: 89 86 89 88  Resp: 13 14 15 15   Temp:      TempSrc:      SpO2: 100% 100% 100% 97%  Weight:      Height:       Eyes: PERRL ENMT: Mucous membranes are Dry. Normal dentition.  Neck: normal, supple Respiratory: Bilateral crackles mostly in the bases, no wheezing. Cardiovascular: Regular rate and rhythm, S1S2 Normal. no murmurs / rubs / gallops. No extremity edema.   Abdomen: Soft, Non-tender, non-distended. Bowel sounds positive.  Musculoskeletal: no clubbing / cyanosis.  Left BKA  Skin: no rashes, lesions, ulcers. No induration Neurologic: Alert awake, somnolent but able to answer. Oriented to hospital, September 2018 and name. Psychiatric: Patient is lethargic/somnolent therefore unable to obtain detailed information..    Labs on Admission: I have personally reviewed following labs and imaging studies  CBC:  Recent Labs Lab 12/13/16 0457  WBC 10.6*  NEUTROABS 9.1*  HGB 10.0*  HCT 31.4*  MCV 90.2  PLT 217   Basic Metabolic Panel:  Recent Labs Lab 12/13/16 0457  NA 140  K 4.1  CL 102  CO2 30  GLUCOSE 178*  BUN 16  CREATININE 0.63  CALCIUM 9.4   GFR: Estimated Creatinine Clearance: 73.4 mL/min (by C-G formula based on SCr of 0.63 mg/dL). Liver Function Tests:  Recent Labs Lab 12/13/16 0457  AST 30  ALT 17  ALKPHOS 102  BILITOT 0.7  PROT 6.4*  ALBUMIN 2.9*   No results for input(s): LIPASE, AMYLASE in the last 168 hours. No results for input(s): AMMONIA in the last 168 hours. Coagulation Profile:  Recent Labs Lab 12/13/16 0457  INR 0.98   Cardiac Enzymes: No results for input(s): CKTOTAL, CKMB, CKMBINDEX, TROPONINI in the last 168 hours. BNP (last 3 results) No results for input(s): PROBNP in the last 8760 hours. HbA1C: No results for input(s): HGBA1C in the last 72 hours. CBG: No results  for input(s): GLUCAP in the last 168 hours. Lipid Profile: No results for input(s): CHOL, HDL, LDLCALC, TRIG, CHOLHDL, LDLDIRECT in the last 72 hours. Thyroid Function Tests: No results for input(s): TSH, T4TOTAL, FREET4, T3FREE, THYROIDAB in the last 72 hours. Anemia Panel: No results for input(s): VITAMINB12, FOLATE, FERRITIN, TIBC, IRON, RETICCTPCT in the last 72 hours. Urine analysis:    Component Value Date/Time   COLORURINE YELLOW 12/24/2010 1016   APPEARANCEUR CLEAR 12/24/2010 1016   LABSPEC 1.026 12/24/2010 1016   PHURINE 7.0 12/24/2010 1016   GLUCOSEU NEGATIVE 12/24/2010 1016   HGBUR NEGATIVE 12/24/2010 1016   BILIRUBINUR NEGATIVE 12/24/2010 1016   KETONESUR NEGATIVE 12/24/2010 1016   PROTEINUR NEGATIVE 12/24/2010 1016   UROBILINOGEN 0.2 12/24/2010 1016   NITRITE NEGATIVE 12/24/2010 1016   LEUKOCYTESUR NEGATIVE 12/24/2010 1016   Sepsis Labs: !!!!!!!!!!!!!!!!!!!!!!!!!!!!!!!!!!!!!!!!!!!! @LABRCNTIP (procalcitonin:4,lacticidven:4) )No results found for this or any previous visit (from the past 240 hour(s)).   Radiological Exams on Admission: Dg Chest 2 View  Result Date: 12/13/2016 CLINICAL DATA:  Increasing weakness, fever, and shortness of breath tonight. Possible code sepsis. EXAM: CHEST  2 VIEW COMPARISON:  09/27/2013 FINDINGS: Shallow inspiration. Mild cardiac enlargement. Bilateral perihilar and upper lung infiltrates. These could represent multifocal pneumonia or edema. Aspiration could potentially have this appearance in the appropriate clinical setting. No blunting of costophrenic angles. No pneumothorax. Calcified and tortuous aorta. Postoperative changes in the lumbar spine. Degenerative changes in the shoulders. IMPRESSION: Bilateral perihilar and upper lung infiltrates suggesting multifocal pneumonia or edema. Electronically Signed   By: 09/29/2013 M.D.   On: 12/13/2016 05:26    EKG: Independently reviewed. Sinus rhythm. Possible LVH.  Assessment/Plan Active  Problems:   CAP (community acquired pneumonia)   Acute metabolic encephalopathy  # Sepsis due to community acquired pneumonia/multifocal pneumonia: Patient is immunosuppressed. Denied sick contact or recent travel. -Patient with fever, leukocytosis, encephalopathy, x-ray finding of pneumonia. -Follow-up culture results, sent to MRSA screening, continue ceftriaxone and azithromycin. Patient tolerated antibiotics well in the ER -Blood pressure acceptable. No need for stress dose steroid.  #Acute metabolic encephalopathy in the setting of pneumonia: PCO2 is not  remarkably high. Patient has no focal neurological deficit. Patient's family informed of issue with balance and slurred speech therefore obtain CT scan of head. -Continue IV hydration -Keep nothing by mouth for now until mental status improved. -Holding pain medications and sedatives for now. -PT/OT evaluation  #Acute respiratory failure with hypoxia: Patient required nonrebreather on arrival. He was hypoxic to 70s in the beginning intravenous. Currently on about 12 L of high flow oxygen. Admitted in stepdown unit. Continue bronchodilators.  #History of rheumatoid arthritis: Continue home medication including low-dose prednisone and leflunomide  #Hypertension: Blood pressure acceptable. Holding losartan. Continue Coreg.  #Hypothyroidism: Check TSH level. Continue Synthroid.  DVT prophylaxis: Lovenox subcutaneous Code Status: Full code Family Communication: I discussed in detail with patient's wife and daughter at bedside in the ER Disposition Plan: Stepdown unit because of requiring high flow oxygen Consults called: None Admission status: Inpatient   Claryssa Sandner Jaynie Collins MD [facility] Pager [phone]  If 7PM-7AM, please contact night-coverage [email or web address] Password [email or web address]  12/13/2016, 8:06 AM

## 2016-12-13 NOTE — Progress Notes (Signed)
PT placed on 55 % Ventimask by RN- with RT hands on and verbal instruction. RN at bedside. Both RT and RN understand MD 02 goal is 92%- 94%.

## 2016-12-13 NOTE — ED Notes (Signed)
Vitals assessment not completed d/t pt taken for diagnostic scan. ENM

## 2016-12-13 NOTE — ED Notes (Signed)
Unable to collect U/A specimen d/t pt spilling/dropping urinal in bed. [employer] RN advised. ENM

## 2016-12-13 NOTE — ED Notes (Signed)
Bed: WA21 Expected date:  Expected time:  Means of arrival:  Comments: EMS elderly/fever/[place]

## 2016-12-13 NOTE — ED Notes (Signed)
Per ICU charge nurse 20 min timer to start now.

## 2016-12-13 NOTE — ED Notes (Signed)
Pt placed on NRB again due to O2 sats dropping to low 80%. Dr Rhunette Croft notified, advised to call RT to place pt on venti mask, with O2 goal 92-94%.

## 2016-12-13 NOTE — Progress Notes (Signed)
A consult was received from an ED physician for rocephin and zmax per pharmacy dosing.  The patient's profile has been reviewed for ht/wt/allergies/indication/available labs.   A one time order has been placed for rocephin 1 Gm and Zmax 500mg .  Further antibiotics/pharmacy consults should be ordered by admitting physician if indicated.                       Thank you,  12/13/2016 5:53 AM

## 2016-12-13 NOTE — ED Notes (Signed)
Pt from home via EMS with c/o fever, per EMS they were called out for possible code stroke, however on their arrival, pt was neurologically intact, febrile, with O2 sats at 76% on room air. At the arrival pt on NRB, sats 100%, he is responding to verbal stimuli.

## 2016-12-13 NOTE — Progress Notes (Signed)
Patient is more alert, awake: start diet BP elevated: resume losartan  Continue to monitor.

## 2016-12-13 NOTE — ED Notes (Signed)
Hospitalist at bedside 

## 2016-12-13 NOTE — ED Notes (Signed)
Only 1 acetaminophen suppository 650 mg administered

## 2016-12-14 DIAGNOSIS — E876 Hypokalemia: Secondary | ICD-10-CM

## 2016-12-14 DIAGNOSIS — J9601 Acute respiratory failure with hypoxia: Secondary | ICD-10-CM

## 2016-12-14 DIAGNOSIS — J189 Pneumonia, unspecified organism: Secondary | ICD-10-CM

## 2016-12-14 LAB — GLUCOSE, CAPILLARY
Glucose-Capillary: 188 mg/dL — ABNORMAL HIGH (ref 65–99)
Glucose-Capillary: 304 mg/dL — ABNORMAL HIGH (ref 65–99)

## 2016-12-14 LAB — C DIFFICILE QUICK SCREEN W PCR REFLEX
C DIFFICILE (CDIFF) INTERP: NOT DETECTED
C DIFFICILE (CDIFF) TOXIN: NEGATIVE
C DIFFICLE (CDIFF) ANTIGEN: NEGATIVE

## 2016-12-14 LAB — BASIC METABOLIC PANEL
Anion gap: 10 (ref 5–15)
BUN: 8 mg/dL (ref 6–20)
CO2: 26 mmol/L (ref 22–32)
Calcium: 8.6 mg/dL — ABNORMAL LOW (ref 8.9–10.3)
Chloride: 102 mmol/L (ref 101–111)
Creatinine, Ser: 0.47 mg/dL — ABNORMAL LOW (ref 0.61–1.24)
GFR calc Af Amer: >60 mL/min (ref >60)
GFR calc non Af Amer: >60 mL/min (ref >60)
GLUCOSE: 122 mg/dL — AB (ref 65–99)
POTASSIUM: 3.4 mmol/L — AB (ref 3.5–5.1)
Sodium: 138 mmol/L (ref 135–145)

## 2016-12-14 LAB — CBC
HCT: 30.9 % — ABNORMAL LOW (ref 39.0–52.0)
Hemoglobin: 10 g/dL — ABNORMAL LOW (ref 13.0–17.0)
MCH: 28.6 pg (ref 26.0–34.0)
MCHC: 32.4 g/dL (ref 30.0–36.0)
MCV: 88.3 fL (ref 78.0–100.0)
Platelets: 231 10*3/uL (ref 150–400)
RBC: 3.5 MIL/uL — ABNORMAL LOW (ref 4.22–5.81)
RDW: 15.2 % (ref 11.5–15.5)
WBC: 13.5 10*3/uL — ABNORMAL HIGH (ref 4.0–10.5)

## 2016-12-14 LAB — STREP PNEUMONIAE URINARY ANTIGEN: Strep Pneumo Urinary Antigen: NEGATIVE

## 2016-12-14 MED ORDER — HYDRALAZINE HCL 20 MG/ML IJ SOLN
10.0000 mg | Freq: Once | INTRAMUSCULAR | Status: AC
  Administered 2016-12-14: 10 mg via INTRAVENOUS
  Filled 2016-12-14: qty 1

## 2016-12-14 MED ORDER — OXYCODONE-ACETAMINOPHEN 5-325 MG PO TABS
1.0000 | ORAL_TABLET | ORAL | Status: DC | PRN
  Administered 2016-12-14 – 2016-12-16 (×11): 2 via ORAL
  Filled 2016-12-14 (×11): qty 2

## 2016-12-14 MED ORDER — HYDRALAZINE HCL 20 MG/ML IJ SOLN
10.0000 mg | Freq: Four times a day (QID) | INTRAMUSCULAR | Status: DC | PRN
  Administered 2016-12-14 – 2016-12-15 (×2): 10 mg via INTRAVENOUS
  Filled 2016-12-14: qty 1
  Filled 2016-12-14: qty 0.5
  Filled 2016-12-14: qty 1

## 2016-12-14 MED ORDER — ORAL CARE MOUTH RINSE
15.0000 mL | Freq: Two times a day (BID) | OROMUCOSAL | Status: DC
  Administered 2016-12-14 – 2016-12-16 (×5): 15 mL via OROMUCOSAL

## 2016-12-14 NOTE — Progress Notes (Signed)
  OT Cancellation Note  Patient Details Name: NISHAWN ROTAN MRN: [id number] DOB: 1945-07-07   Cancelled Treatment:    Reason Eval/Treat Not Completed: Other (comment).  Pt was up with RN earlier today.  Wife is planning to bring prosthesis. Will check back either over the weekend or Monday.  Anastazja Isaac 12/14/2016, 12:06 PM  Marica Otter, OTR/L [phone] 12/14/2016

## 2016-12-14 NOTE — Progress Notes (Signed)
OT Cancellation Note  Patient Details Name: JABAR KRYSIAK MRN: [id number] DOB: 23-Jul-1945   Cancelled Treatment:    Reason Eval/Treat Not Completed: Other (comment).  Pt does not yet have prosthesis.  Will likely check back Monday.  Laporscha Linehan 12/14/2016, 2:21 PM  Marica Otter, OTR/L [phone] 12/14/2016

## 2016-12-14 NOTE — Progress Notes (Addendum)
PROGRESS NOTE                                                                                                                                                                                                             Patient Demographics:    Johnny Patrick, is a 71 y.o. male, DOB - 08-06-1945, JIR:[id number]  Admit date - 12/13/2016   Admitting Physician Dron Jaynie Collins, MD  Outpatient Primary MD for the patient is Georgianne Fick, MD  LOS - 1  Outpatient Specialists:None  Chief Complaint  Patient presents with  . Fever  . Respiratory Distress       Brief Narrative  71 year old male with hypertension, hypothyroidism, psoriatic arthritis on chronic prednisone, prior history of sepsis, multiple back surgeries presented with altered mental status for 1 day. Per his family he has been acting sluggish with intermittent confusion for past 2-3 weeks. No sick contact or recent travel. In the ER he was septic with fever of 101.69F, tachycardic and tachypneic with hypoxia requiring nonrebreather initially then high flow nasal cannula up to 12 L. ABG was unremarkable. Blood work showed WBC of 10.6 K, hemoglobin of 10 and normal electrolytes. Head CT was negative for acute findings. Chest x-ray showed multifocal pneumonia. Admitted to stepdown unit for sepsis with acute hypoxic respiratory failure secondary to multifocal pneumonia.  Subjective:   Patient requiring up to 6 L oxygen via nasal cannula. Feels his breathing to be better.  Assessment  & Plan :   Principal problem Sepsis (HCC) Currently resolved. Secondary to multifocal pneumonia. Continue empiric Rocephin and azithromycin. Follow blood culture, check urine strep  and Legionella antigen. Supportive care with Tylenol and antitussives.  Acute respiratory failure with hypoxia (HCC) Secondary to pneumonia. Currently requiring 6 L oxygen via nasal cannula. Wean as  tolerated. When necessary nebs.  Multifocal pneumonia Empiric antibiotics as above. Follow cultures. Continue IV fluids.  Acute metabolic encephalopathy. Secondary to lobar pneumonia. Mental status returned to baseline.  Resume narcotic and bedtime stories. PT evaluation tomorrow.  Rheumatoid arthritis Continue low-dose prednisone and leflunomide.  Essential hypertension Blood pressure elevated. Resumed losartan and Coreg. Added when necessary hydralazine.  Hypothyroidism Continue Synthroid.  Hypokalemia Replenished  Code Status : Full code  Family Communication  : None at bedside  Disposition Plan  : Continue step down monitoring.  Barriers For Discharge :  Active symptoms   Consults  :  None  Procedures  : CT head  DVT Prophylaxis  :  Lovenox  Lab Results  Component Value Date   PLT 231 12/14/2016    Antibiotics  :    Anti-infectives    Start     Dose/Rate Route Frequency Ordered Stop   12/14/16 0600  azithromycin (ZITHROMAX) 500 mg in dextrose 5 % 250 mL IVPB     500 mg 250 mL/hr over 60 Minutes Intravenous Every 24 hours 12/13/16 0804     12/14/16 0500  cefTRIAXone (ROCEPHIN) 1 g in dextrose 5 % 50 mL IVPB     1 g 100 mL/hr over 30 Minutes Intravenous Every 24 hours 12/13/16 0804     12/13/16 0545  cefTRIAXone (ROCEPHIN) 1 g in dextrose 5 % 50 mL IVPB     1 g 100 mL/hr over 30 Minutes Intravenous  Once 12/13/16 0531 12/13/16 0613   12/13/16 0545  azithromycin (ZITHROMAX) 500 mg in dextrose 5 % 250 mL IVPB     500 mg 250 mL/hr over 60 Minutes Intravenous  Once 12/13/16 0531 12/13/16 0646        Objective:   Vitals:   12/14/16 0633 12/14/16 0700 12/14/16 0721 12/14/16 0800  BP: (!) 184/83 (!) 154/70  (!) 163/72  Pulse:  83  85  Resp:  (!) 21  (!) 24  Temp:   98 F (36.7 C)   TempSrc:   Oral   SpO2:  94%  91%  Weight:      Height:        Wt Readings from Last 3 Encounters:  12/13/16 63 kg (139 lb)  09/18/16 63 kg (139 lb)  10/05/15 63.7 kg (140  lb 6.4 oz)     Intake/Output Summary (Last 24 hours) at 12/14/16 1031 Last data filed at 12/14/16 0800  Gross per 24 hour  Intake             1115 ml  Output             1225 ml  Net             -110 ml     Physical Exam  Gen: Elderly male, not in distress, Appears fatigued  HEENT: no pallor, moist mucosa, supple neck Chest:Diminished breath sounds over bilateral lung bases  CVS: N S1&S2, no murmurs, rubs or gallop GI: soft, NT, ND, BS+ Musculoskeletal: warm, no edema ZHY:QMVHQ and oriented, nonfocal   Data Review:    CBC  Recent Labs Lab 12/13/16 0457 12/14/16 0411  WBC 10.6* 13.5*  HGB 10.0* 10.0*  HCT 31.4* 30.9*  PLT 217 231  MCV 90.2 88.3  MCH 28.7 28.6  MCHC 31.8 32.4  RDW 15.6* 15.2  LYMPHSABS 0.9  --   MONOABS 0.4  --   EOSABS 0.2  --   BASOSABS 0.0  --     Chemistries   Recent Labs Lab 12/13/16 0457 12/14/16 0411  NA 140 138  K 4.1 3.4*  CL 102 102  CO2 30 26  GLUCOSE 178* 122*  BUN 16 8  CREATININE 0.63 0.47*  CALCIUM 9.4 8.6*  AST 30  --   ALT 17  --   ALKPHOS 102  --   BILITOT 0.7  --    ------------------------------------------------------------------------------------------------------------------ No results for input(s): CHOL, HDL, LDLCALC, TRIG, CHOLHDL, LDLDIRECT in the last 72 hours.  Lab Results  Component Value Date   HGBA1C 6.3 (H) 01/02/2011   ------------------------------------------------------------------------------------------------------------------  Recent Labs  12/13/16 0457  TSH 0.830   ------------------------------------------------------------------------------------------------------------------ No results for input(s): VITAMINB12, FOLATE, FERRITIN, TIBC, IRON, RETICCTPCT in the last 72 hours.  Coagulation profile  Recent Labs Lab 12/13/16 0457  INR 0.98    No results for input(s): DDIMER in the last 72 hours.  Cardiac Enzymes No results for input(s): CKMB, TROPONINI, MYOGLOBIN in the  last 168 hours.  Invalid input(s): CK ------------------------------------------------------------------------------------------------------------------ No results found for: BNP  Inpatient Medications  Scheduled Meds: . carvedilol  25 mg Oral BID  . enoxaparin (LOVENOX) injection  40 mg Subcutaneous Daily  . fluticasone furoate-vilanterol  1 puff Inhalation Daily  . folic acid  1 mg Oral Daily  . gabapentin  300 mg Oral TID  . ipratropium-albuterol  3 mL Nebulization TID  . leflunomide  20 mg Oral Daily  . levothyroxine  175 mcg Oral QAC breakfast  . losartan  50 mg Oral Daily  . multivitamin with minerals  1 tablet Oral Daily  . potassium chloride SA  20 mEq Oral Daily  . predniSONE  5 mg Oral Daily  . vitamin C  500 mg Oral Daily   Continuous Infusions: . azithromycin Stopped (12/14/16 0705)  . cefTRIAXone (ROCEPHIN)  IV Stopped (12/14/16 0532)   PRN Meds:.acetaminophen **OR** acetaminophen, hydrALAZINE, ondansetron **OR** ondansetron (ZOFRAN) IV, oxyCODONE-acetaminophen, polyethylene glycol, temazepam  Micro Results Recent Results (from the past 240 hour(s))  Culture, blood (Routine x 2)     Status: None (Preliminary result)   Collection Time: 12/13/16  5:05 AM  Result Value Ref Range Status   Specimen Description BLOOD RIGHT FOREARM  Final   Special Requests   Final    BOTTLES DRAWN AEROBIC AND ANAEROBIC Blood Culture adequate volume   Culture   Final    NO GROWTH 1 DAY Performed at [facility], [address], [place], [place] [address]    Report Status PENDING  Incomplete  Culture, blood (Routine x 2)     Status: None (Preliminary result)   Collection Time: 12/13/16  5:05 AM  Result Value Ref Range Status   Specimen Description BLOOD LEFT FOREARM  Final   Special Requests   Final    BOTTLES DRAWN AEROBIC AND ANAEROBIC Blood Culture adequate volume   Culture   Final    NO GROWTH 1 DAY Performed at [facility], [address], [place], [place]  [address]    Report Status PENDING  Incomplete  MRSA PCR Screening     Status: None   Collection Time: 12/13/16  8:51 AM  Result Value Ref Range Status   MRSA by PCR NEGATIVE NEGATIVE Final    Comment:        The GeneXpert MRSA Assay (FDA approved for NASAL specimens only), is one component of a comprehensive MRSA colonization surveillance program. It is not intended to diagnose MRSA infection nor to guide or monitor treatment for MRSA infections.     Radiology Reports Dg Chest 2 View  Result Date: 12/13/2016 CLINICAL DATA:  Increasing weakness, fever, and shortness of breath tonight. Possible code sepsis. EXAM: CHEST  2 VIEW COMPARISON:  09/27/2013 FINDINGS: Shallow inspiration. Mild cardiac enlargement. Bilateral perihilar and upper lung infiltrates. These could represent multifocal pneumonia or edema. Aspiration could potentially have this appearance in the appropriate clinical setting. No blunting of costophrenic angles. No pneumothorax. Calcified and tortuous aorta. Postoperative changes in the lumbar spine. Degenerative changes in the shoulders. IMPRESSION: Bilateral perihilar and upper lung infiltrates suggesting multifocal pneumonia or  edema. Electronically Signed   By: Burman Nieves M.D.   On: 12/13/2016 05:26   Ct Head Wo Contrast  Result Date: 12/13/2016 CLINICAL DATA:  Altered level consciousness. History of hypothyroidism, hypertension. EXAM: CT HEAD WITHOUT CONTRAST TECHNIQUE: Contiguous axial images were obtained from the base of the skull through the vertex without intravenous contrast. COMPARISON:  10/26/2007 FINDINGS: Brain: No evidence of acute infarction, hemorrhage, extra-axial collection, ventriculomegaly, or mass effect. Generalized cerebral atrophy. Periventricular white matter low attenuation likely secondary to microangiopathy. Vascular: Cerebrovascular atherosclerotic calcifications are noted. Skull: Negative for fracture or focal lesion. Sinuses/Orbits: Visualized  portions of the orbits are unremarkable. Visualized portions of the paranasal sinuses and mastoid air cells are unremarkable. Other: None. IMPRESSION: 1. No acute intracranial pathology. 2. Chronic microvascular disease and cerebral atrophy. Electronically Signed   By: Elige Ko   On: 12/13/2016 08:50    Time Spent in minutes 35   Eddie North M.D on 12/14/2016 at 10:31 AM  Between 7am to 7pm - Pager - [phone]  After 7pm go to [email or web address] - password [facility]  [facility] -  Office  [phone]

## 2016-12-14 NOTE — Progress Notes (Signed)
PT Cancellation Note  Patient Details Name: Johnny Patrick MRN: [id number] DOB: 1945/05/02   Cancelled Treatment:     PT order received but eval deferred at pt request 2* nausea and extreme fatigue.  Will follow.   Camiya Vinal 12/14/2016, 12:11 PM

## 2016-12-14 NOTE — Progress Notes (Signed)
PT Cancellation Note  Patient Details Name: Johnny Patrick MRN: [id number] DOB: 12/17/1945   Cancelled Treatment:     PT order received but eval deferred 2* pt fatigue.  RN reports pt's spouse to bring in pt's prosthetic LE.   Kesleigh Morson 12/14/2016, 3:39 PM

## 2016-12-15 DIAGNOSIS — J181 Lobar pneumonia, unspecified organism: Secondary | ICD-10-CM

## 2016-12-15 MED ORDER — GABAPENTIN 300 MG PO CAPS: 300.0000 mg | ORAL_CAPSULE | Freq: Three times a day (TID) | ORAL | Status: DC

## 2016-12-15 MED ORDER — AMLODIPINE BESYLATE 5 MG PO TABS
2.5000 mg | ORAL_TABLET | Freq: Every day | ORAL | Status: DC
  Administered 2016-12-15 – 2016-12-16 (×2): 2.5 mg via ORAL
  Filled 2016-12-15 (×2): qty 1

## 2016-12-15 MED ORDER — HYDROMORPHONE HCL 2 MG PO TABS
2.0000 mg | ORAL_TABLET | ORAL | Status: DC
  Administered 2016-12-15 – 2016-12-16 (×6): 2 mg via ORAL
  Filled 2016-12-15 (×7): qty 1

## 2016-12-15 MED ORDER — AZITHROMYCIN 250 MG PO TABS
500.0000 mg | ORAL_TABLET | Freq: Every day | ORAL | Status: DC
  Administered 2016-12-16: 500 mg via ORAL
  Filled 2016-12-15: qty 2

## 2016-12-15 NOTE — Evaluation (Signed)
Physical Therapy Evaluation Patient Details Name: Johnny Patrick MRN: [id number] DOB: [birth year] Today's Date: 12/15/2016   History of Present Illness  71 yo male admitted with sepsis due to pna, acute encephalopathy. hx of hypothyroidism, back sg, RA  Clinical Impression  On eval, pt required Min guard assist for mobility. Pt required Min assist for prepping liner before donning prosthesis (wife had turned liner on the wrong side). He was able to don prosthesis at supervision level. He walked ~50 feet with a RW. He tolerated activity fairly well. O2 sat dropped to 85% on RA during ambulation. No LOB with RW use.  Discussed d/c plan-recommended HHPT f/u however pt and wife do not feel they will need f/u at discharge. Will follow and progress activity as tolerated    Follow Up Recommendations Home health PT;Supervision/Assistance - 24 hour (pt may decline HH f/u)    Equipment Recommendations  None recommended by PT    Recommendations for Other Services       Precautions / Restrictions Precautions Precautions: Fall Precaution Comments: L BKA-uses prosthesis Restrictions Weight Bearing Restrictions: No      Mobility  Bed Mobility Overal bed mobility: Needs Assistance Bed Mobility: Supine to Sit     Supine to sit: Supervision;HOB elevated     General bed mobility comments: for safety  Transfers Overall transfer level: Needs assistance Equipment used: Rolling walker (2 wheeled) Transfers: Sit to/from Stand Sit to Stand: Min guard         General transfer comment: close guard for safety. VCs safety, hand placement.   Ambulation/Gait Ambulation/Gait assistance: Min guard Ambulation Distance (Feet): 50 Feet Assistive device: Rolling walker (2 wheeled) Gait Pattern/deviations: Step-through pattern;Decreased stride length     General Gait Details: close guard for safety. O2 sat dropped to 85% on RA during ambulation.   Stairs            Wheelchair Mobility     Modified Rankin (Stroke Patients Only)       Balance Overall balance assessment: Needs assistance           Standing balance-Leahy Scale: Poor                               Pertinent Vitals/Pain Pain Assessment: No/denies pain    Home Living Family/patient expects to be discharged to:: Private residence Living Arrangements: Spouse/significant other Available Help at Discharge: Family Type of Home: House Home Access: Stairs to enter Entrance Stairs-Rails: None Entrance Stairs-Number of Steps: 2 Home Layout: One level;Laundry or work area in [employer] Equipment: Environmental consultant - 2 wheels;Cane - single point;Bedside commode;Shower seat;Adaptive equipment;Walker - 4 wheels      Prior Function Level of Independence: Independent with assistive device(s)         Comments: uses rollator vs cane for ambulation     Hand Dominance        Extremity/Trunk Assessment   Upper Extremity Assessment Upper Extremity Assessment: Generalized weakness    Lower Extremity Assessment Lower Extremity Assessment: Generalized weakness    Cervical / Trunk Assessment Cervical / Trunk Assessment: Normal  Communication   Communication: No difficulties  Cognition Arousal/Alertness: Awake/alert Behavior During Therapy: WFL for tasks assessed/performed Overall Cognitive Status: Within Functional Limits for tasks assessed  General Comments      Exercises     Assessment/Plan    PT Assessment Patient needs continued PT services  PT Problem List Decreased strength;Decreased mobility;Decreased balance;Decreased knowledge of use of DME;Decreased activity tolerance       PT Treatment Interventions DME instruction;Gait training;Therapeutic activities;Therapeutic exercise;Patient/family education;Balance training;Functional mobility training    PT Goals (Current goals can be found in the Care Plan section)  Acute  Rehab PT Goals Patient Stated Goal: home soon. regain PLOF.  PT Goal Formulation: With patient/family Time For Goal Achievement: 12/29/16 Potential to Achieve Goals: Good    Frequency Min 3X/week   Barriers to discharge        Co-evaluation               AM-PAC PT "6 Clicks" Daily Activity  Outcome Measure Difficulty turning over in bed (including adjusting bedclothes, sheets and blankets)?: A Little Difficulty moving from lying on back to sitting on the side of the bed? : A Little Difficulty sitting down on and standing up from a chair with arms (e.g., wheelchair, bedside commode, etc,.)?: A Little Help needed moving to and from a bed to chair (including a wheelchair)?: A Little Help needed walking in hospital room?: A Little Help needed climbing 3-5 steps with a railing? : A Little 6 Click Score: 18    End of Session Equipment Utilized During Treatment: Gait belt;Oxygen Activity Tolerance: Patient tolerated treatment well Patient left: in chair;with call bell/phone within reach;with family/visitor present;with chair alarm set   PT Visit Diagnosis: Muscle weakness (generalized) (M62.81);Difficulty in walking, not elsewhere classified (R26.2)    Time: 0938-1829 PT Time Calculation (min) (ACUTE ONLY): 22 min   Charges:   PT Evaluation $PT Eval Low Complexity: 1 Low     PT G Codes:          Rebeca Alert, MPT Pager: [phone]

## 2016-12-15 NOTE — Progress Notes (Signed)
Pt has arrived to room 1520 from ICU. Alert, oriented and without c/o. VS WNL O2 at 2 l/min via [place].

## 2016-12-15 NOTE — Progress Notes (Signed)
PROGRESS NOTE                                                                                                                                                                                                             Patient Demographics:    Johnny Patrick, is a 71 y.o. male, DOB - 01-25-1946, OMB:[id number]  Admit date - 12/13/2016   Admitting Physician Dron Jaynie Collins, MD  Outpatient Primary MD for the patient is Georgianne Fick, MD  LOS - 2  Outpatient Specialists:None  Chief Complaint  Patient presents with  . Fever  . Respiratory Distress       Brief Narrative  71 year old male with hypertension, hypothyroidism, psoriatic arthritis on chronic prednisone, prior history of sepsis, multiple back surgeries presented with altered mental status for 1 day. Per his family he has been acting sluggish with intermittent confusion for past 2-3 weeks. No sick contact or recent travel. In the ER he was septic with fever of 101.71F, tachycardic and tachypneic with hypoxia requiring nonrebreather initially then high flow nasal cannula up to 12 L. ABG was unremarkable. Blood work showed WBC of 10.6 K, hemoglobin of 10 and normal electrolytes. Head CT was negative for acute findings. Chest x-ray showed multifocal pneumonia. Admitted to stepdown unit for sepsis with acute hypoxic respiratory failure secondary to multifocal pneumonia.    Subjective:   Breathing continues to improve. Maintaining sats on 2 L via nasal cannula.  Assessment  & Plan :   Principal problem  Acute respiratory failure with hypoxia (HCC) Multilobar pneumonia. Oxygen weaned to 2 L via nasal cannula and maintaining sats. Continue empiric Rocephin and azithromycin. Cultures negative.  Sepsis (HCC) Secondary to pneumonia. Resolved.  Multifocal pneumonia Empiric antibiotics as above. Follow cultures. Continue IV fluids.  Acute metabolic  encephalopathy. Secondary to lobar pneumonia. Head CT unremarkable. Resolved. Resume home dose not cortex.  Rheumatoid arthritis Continue low-dose prednisone and leflunomide.   Uncontrolled hypertension Continue Coreg and losartan. We'll add low-dose amlodipine.  Hypothyroidism Continue Synthroid.  Hypokalemia Replenished  Code Status : Full code  Family Communication  : Discussed with wife on the phone Disposition Plan  : Transfer to medical floor. Barriers For Discharge : Improving symptoms. Pending PT evaluation.   Consults  :  None  Procedures  : CT head  DVT Prophylaxis  :  Lovenox  Lab Results  Component Value Date   PLT 231 12/14/2016    Antibiotics  :    Anti-infectives    Start     Dose/Rate Route Frequency Ordered Stop   12/16/16 1000  azithromycin (ZITHROMAX) tablet 500 mg     500 mg Oral Daily 12/15/16 0746     12/14/16 0600  azithromycin (ZITHROMAX) 500 mg in dextrose 5 % 250 mL IVPB  Status:  Discontinued     500 mg 250 mL/hr over 60 Minutes Intravenous Every 24 hours 12/13/16 0804 12/15/16 0746   12/14/16 0500  cefTRIAXone (ROCEPHIN) 1 g in dextrose 5 % 50 mL IVPB     1 g 100 mL/hr over 30 Minutes Intravenous Every 24 hours 12/13/16 0804     12/13/16 0545  cefTRIAXone (ROCEPHIN) 1 g in dextrose 5 % 50 mL IVPB     1 g 100 mL/hr over 30 Minutes Intravenous  Once 12/13/16 0531 12/13/16 0613   12/13/16 0545  azithromycin (ZITHROMAX) 500 mg in dextrose 5 % 250 mL IVPB     500 mg 250 mL/hr over 60 Minutes Intravenous  Once 12/13/16 0531 12/13/16 0646        Objective:   Vitals:   12/15/16 0700 12/15/16 0745 12/15/16 0800 12/15/16 0900  BP: (!) 146/71  (!) 180/87 (!) 147/69  Pulse: 81  85 90  Resp: (!) 21  17 (!) 25  Temp:   98.6 F (37 C)   TempSrc:   Oral   SpO2: 96% 97% 93% 94%  Weight:      Height:        Wt Readings from Last 3 Encounters:  12/13/16 63 kg (139 lb)  09/18/16 63 kg (139 lb)  10/05/15 63.7 kg (140 lb 6.4 oz)      Intake/Output Summary (Last 24 hours) at 12/15/16 1043 Last data filed at 12/15/16 0900  Gross per 24 hour  Intake              890 ml  Output             1450 ml  Net             -560 ml     Physical Exam Gen.: Elderly male appears fatigued, Not in distress HEENT: Moist mucosa, supple neck Chest: Fine bibasilar crackles CVS: Normal S1 and S2, no murmurs GI: Soft, nondistended, nontender Musculoskeletal: Warm, no edema    Data Review:    CBC  Recent Labs Lab 12/13/16 0457 12/14/16 0411  WBC 10.6* 13.5*  HGB 10.0* 10.0*  HCT 31.4* 30.9*  PLT 217 231  MCV 90.2 88.3  MCH 28.7 28.6  MCHC 31.8 32.4  RDW 15.6* 15.2  LYMPHSABS 0.9  --   MONOABS 0.4  --   EOSABS 0.2  --   BASOSABS 0.0  --     Chemistries   Recent Labs Lab 12/13/16 0457 12/14/16 0411  NA 140 138  K 4.1 3.4*  CL 102 102  CO2 30 26  GLUCOSE 178* 122*  BUN 16 8  CREATININE 0.63 0.47*  CALCIUM 9.4 8.6*  AST 30  --   ALT 17  --   ALKPHOS 102  --   BILITOT 0.7  --    ------------------------------------------------------------------------------------------------------------------ No results for input(s): CHOL, HDL, LDLCALC, TRIG, CHOLHDL, LDLDIRECT in the last 72 hours.  Lab Results  Component Value Date   HGBA1C 6.3 (H) 01/02/2011   ------------------------------------------------------------------------------------------------------------------  Recent Labs  12/13/16 0457  TSH 0.830   ------------------------------------------------------------------------------------------------------------------  No results for input(s): VITAMINB12, FOLATE, FERRITIN, TIBC, IRON, RETICCTPCT in the last 72 hours.  Coagulation profile  Recent Labs Lab 12/13/16 0457  INR 0.98    No results for input(s): DDIMER in the last 72 hours.  Cardiac Enzymes No results for input(s): CKMB, TROPONINI, MYOGLOBIN in the last 168 hours.  Invalid input(s):  CK ------------------------------------------------------------------------------------------------------------------ No results found for: BNP  Inpatient Medications  Scheduled Meds: . amLODipine  2.5 mg Oral Daily  . [START ON 12/16/2016] azithromycin  500 mg Oral Daily  . carvedilol  25 mg Oral BID  . enoxaparin (LOVENOX) injection  40 mg Subcutaneous Daily  . fluticasone furoate-vilanterol  1 puff Inhalation Daily  . folic acid  1 mg Oral Daily  . gabapentin  300 mg Oral TID  . ipratropium-albuterol  3 mL Nebulization TID  . leflunomide  20 mg Oral Daily  . levothyroxine  175 mcg Oral QAC breakfast  . losartan  50 mg Oral Daily  . mouth rinse  15 mL Mouth Rinse BID  . multivitamin with minerals  1 tablet Oral Daily  . potassium chloride SA  20 mEq Oral Daily  . predniSONE  5 mg Oral Daily  . vitamin C  500 mg Oral Daily   Continuous Infusions: . cefTRIAXone (ROCEPHIN)  IV Stopped (12/15/16 0441)   PRN Meds:.acetaminophen **OR** acetaminophen, hydrALAZINE, ondansetron **OR** ondansetron (ZOFRAN) IV, oxyCODONE-acetaminophen, polyethylene glycol, temazepam  Micro Results Recent Results (from the past 240 hour(s))  Culture, blood (Routine x 2)     Status: None (Preliminary result)   Collection Time: 12/13/16  5:05 AM  Result Value Ref Range Status   Specimen Description BLOOD RIGHT FOREARM  Final   Special Requests   Final    BOTTLES DRAWN AEROBIC AND ANAEROBIC Blood Culture adequate volume   Culture   Final    NO GROWTH 2 DAYS Performed at [facility], [address] [place], [place] [address]    Report Status PENDING  Incomplete  Culture, blood (Routine x 2)     Status: None (Preliminary result)   Collection Time: 12/13/16  5:05 AM  Result Value Ref Range Status   Specimen Description BLOOD LEFT FOREARM  Final   Special Requests   Final    BOTTLES DRAWN AEROBIC AND ANAEROBIC Blood Culture adequate volume   Culture   Final    NO GROWTH 2 DAYS Performed at  [facility], [address], [place], [place] [address]    Report Status PENDING  Incomplete  MRSA PCR Screening     Status: None   Collection Time: 12/13/16  8:51 AM  Result Value Ref Range Status   MRSA by PCR NEGATIVE NEGATIVE Final    Comment:        The GeneXpert MRSA Assay (FDA approved for NASAL specimens only), is one component of a comprehensive MRSA colonization surveillance program. It is not intended to diagnose MRSA infection nor to guide or monitor treatment for MRSA infections.   C difficile quick scan w PCR reflex     Status: None   Collection Time: 12/14/16  2:41 PM  Result Value Ref Range Status   C Diff antigen NEGATIVE NEGATIVE Final   C Diff toxin NEGATIVE NEGATIVE Final   C Diff interpretation No C. difficile detected.  Final    Radiology Reports Dg Chest 2 View  Result Date: 12/13/2016 CLINICAL DATA:  Increasing weakness, fever, and shortness of breath tonight. Possible code sepsis. EXAM: CHEST  2  VIEW COMPARISON:  09/27/2013 FINDINGS: Shallow inspiration. Mild cardiac enlargement. Bilateral perihilar and upper lung infiltrates. These could represent multifocal pneumonia or edema. Aspiration could potentially have this appearance in the appropriate clinical setting. No blunting of costophrenic angles. No pneumothorax. Calcified and tortuous aorta. Postoperative changes in the lumbar spine. Degenerative changes in the shoulders. IMPRESSION: Bilateral perihilar and upper lung infiltrates suggesting multifocal pneumonia or edema. Electronically Signed   By: Burman Nieves M.D.   On: 12/13/2016 05:26   Ct Head Wo Contrast  Result Date: 12/13/2016 CLINICAL DATA:  Altered level consciousness. History of hypothyroidism, hypertension. EXAM: CT HEAD WITHOUT CONTRAST TECHNIQUE: Contiguous axial images were obtained from the base of the skull through the vertex without intravenous contrast. COMPARISON:  10/26/2007 FINDINGS: Brain: No evidence of acute infarction,  hemorrhage, extra-axial collection, ventriculomegaly, or mass effect. Generalized cerebral atrophy. Periventricular white matter low attenuation likely secondary to microangiopathy. Vascular: Cerebrovascular atherosclerotic calcifications are noted. Skull: Negative for fracture or focal lesion. Sinuses/Orbits: Visualized portions of the orbits are unremarkable. Visualized portions of the paranasal sinuses and mastoid air cells are unremarkable. Other: None. IMPRESSION: 1. No acute intracranial pathology. 2. Chronic microvascular disease and cerebral atrophy. Electronically Signed   By: Elige Ko   On: 12/13/2016 08:50    Time Spent in minutes 25   Eddie North M.D on 12/15/2016 at 10:43 AM  Between 7am to 7pm - Pager - [phone]  After 7pm go to [email or web address] - password [facility]  [facility] -  Office  [phone]

## 2016-12-15 NOTE — Progress Notes (Signed)
PHARMACIST - PHYSICIAN COMMUNICATION DR:   Dhungel CONCERNING: Antibiotic IV to Oral Route Change Policy  RECOMMENDATION: This patient is receiving azithromycin by the intravenous route.  Based on criteria approved by the Pharmacy and Therapeutics Committee, the antibiotic(s) is/are being converted to the equivalent oral dose form(s).   DESCRIPTION: These criteria include:  Patient being treated for a respiratory tract infection, urinary tract infection, cellulitis or clostridium difficile associated diarrhea if on metronidazole  The patient is not neutropenic and does not exhibit a GI malabsorption state  The patient is eating (either orally or via tube) and/or has been taking other orally administered medications for a least 24 hours  The patient is improving clinically and has a Tmax < 100.5  If you have questions about this conversion, please contact the Pharmacy Department  []   [phone] )  ( [phone] []   [phone] )  [facility] []   [phone] )  [facility] []   [phone] )  [facility] [x]   [phone] )  [facility]  ( [phone], [email or web address].D. 12/15/2016 7:47 AM

## 2016-12-16 DIAGNOSIS — G9341 Metabolic encephalopathy: Secondary | ICD-10-CM

## 2016-12-16 DIAGNOSIS — E876 Hypokalemia: Secondary | ICD-10-CM | POA: Diagnosis not present

## 2016-12-16 DIAGNOSIS — J189 Pneumonia, unspecified organism: Secondary | ICD-10-CM | POA: Diagnosis present

## 2016-12-16 DIAGNOSIS — I10 Essential (primary) hypertension: Secondary | ICD-10-CM

## 2016-12-16 DIAGNOSIS — A419 Sepsis, unspecified organism: Principal | ICD-10-CM

## 2016-12-16 LAB — LEGIONELLA PNEUMOPHILA SEROGP 1 UR AG: L. PNEUMOPHILA SEROGP 1 UR AG: NEGATIVE

## 2016-12-16 MED ORDER — AMLODIPINE BESYLATE 5 MG PO TABS: 5.0000 mg | ORAL_TABLET | Freq: Every day | ORAL | 0 refills | Status: DC

## 2016-12-16 MED ORDER — LEVOFLOXACIN 750 MG PO TABS: 750.0000 mg | ORAL_TABLET | Freq: Every day | ORAL | 0 refills | Status: DC

## 2016-12-16 NOTE — Care Management Note (Signed)
Case Management Note  Patient Details  Name: Johnny Patrick MRN: [id number] Date of Birth: 1945-07-17  Subjective/Objective:                  71 year old male with hypertension, hypothyroidism, psoriatic arthritis on chronic prednisone, prior history of sepsis, multiple back surgeries presented with altered mental status for 1 day. Per his family he has been acting sluggish with intermittent confusion for past 2-3 weeks. No sick contact or recent travel. In the ER he was septic with fever of 101.61F, tachycardic and tachypneic with hypoxia requiring nonrebreather initially then high flow nasal cannula up to 12 L. ABG was unremarkable. Blood work showed WBC of 10.6 K, hemoglobin of 10 and normal electrolytes. Head CT was negative for acute findings. Chest x-ray showed multifocal pneumonia. Admitted to stepdown unit for sepsis with acute hypoxic respiratory failure secondary to multifocal pneumonia.   Action/Plan: Date:  December 16, 2016 Chart reviewed for concurrent status and case management needs.  Will continue to follow patient progress.  Discharge Planning: following for needs  Expected discharge date: [id number]  Marcelle Smiling, BSN, [place], [place]   [phone]   Expected Discharge Date:   (unknown)               Expected Discharge Plan:  Home/Self Care  In-House Referral:     Discharge planning Services  CM Consult  Post Acute Care Choice:    Choice offered to:     DME Arranged:    DME Agency:     HH Arranged:    HH Agency:     Status of Service:  In process, will continue to follow  If discussed at Long Length of Stay Meetings, dates discussed:    Additional Comments:  Golda Acre, RN 12/16/2016, 10:00 AM

## 2016-12-16 NOTE — Progress Notes (Signed)
SATURATION QUALIFICATIONS: (This note is used to comply with regulatory documentation for home oxygen)  Patient Saturations on Room Air at Rest = 94%  Patient Saturations on Room Air while Ambulating = 91%  Patient Saturations on Liters of oxygen while Ambulating = %- not tested due to saturation > 88 on RA.  Please briefly explain why patient needs home oxygen:patient does not require oxygen  Blanchard Kelch PT [phone]

## 2016-12-16 NOTE — Discharge Summary (Signed)
Physician Discharge Summary  Johnny Patrick EHU:[id number] DOB: May 29, 1945 DOA: 12/13/2016  PCP: Georgianne Fick, MD  Admit date: 12/13/2016 Discharge date: 12/16/2016  Admitted From: Home Disposition:  Home  Recommendations for Outpatient Follow-up:  1. Follow up with PCP in 1-2 weeks. Please obtain chest x-ray in 4 weeks to ensure resolution of his pneumonia. 2. Patient will complete antibiotic course on 12/19/2016.   Home Health: None (patient declined home health PT) Equipment/Devices: None  Discharge Condition: Fair CODE STATUS: Full code Diet recommendation: Regular    Discharge Diagnoses:  Principal Problem:   Acute respiratory failure with hypoxia (HCC)   Active Problems:   Sepsis (HCC)   Community acquired pneumonia   Acute metabolic encephalopathy   Multifocal pneumonia   Uncontrolled hypertension   Hypokalemia   Chronic low back pain  Brief narrative status history of present illness Please refer to admission H&P for details, in brief,71 year old male with hypertension, hypothyroidism, psoriatic arthritis on chronic prednisone, prior history of sepsis, multiple back surgeries presented with altered mental status for 1 day. Per his family he has been acting sluggish with intermittent confusion for past 2-3 weeks. No sick contact or recent travel. In the ER he was septic with fever of 101.71F, tachycardic and tachypneic with hypoxia requiring nonrebreather initially then high flow nasal cannula up to 12 L. ABG was unremarkable. Blood work showed WBC of 10.6 K, hemoglobin of 10 and normal electrolytes. Head CT was negative for acute findings. Chest x-ray showed multifocal pneumonia. Admitted to stepdown unit for sepsis with acute hypoxic respiratory failure secondary to multifocal pneumonia.  Hospital course Principal problem  Acute respiratory failure with hypoxia (HCC) Multilobar pneumonia. Managed initially in stepdown. Improved and transferred to medical  floor. Weaned off oxygen and now mentating shaft both rest and ambulation. Treated with empiric Rocephin and azithromycin and will be discharged home on oral Levaquin complete 5 day course of antibiotic. Follow-up with PCP in 1 week. Patient will need a repeat chest x-ray in 4 weeks to ensure resolution.  Sepsis (HCC) Secondary to pneumonia. Resolved.  Multifocal pneumonia Empiric antibiotics as above. Also is negative.  Acute metabolic encephalopathy. Secondary to lobar pneumonia. Head CT unremarkable. Resolved. Resume home dose pain medications.  Uncontrolled hypertension Continue Coreg and losartan. Added low dose amlodipine.  Rheumatoid arthritis Continue low-dose prednisone and leflunomide.    Hypothyroidism Continue Synthroid.  Hypokalemia Replenished  COPD Stable. Continue home inhalers.   Family Communication  : Discussed with wife on the phone Disposition Plan  :  home    Consults  :  None  Procedures  : CT head  Discharge Instructions   Allergies as of 12/16/2016      Reactions   Amoxicillin-pot Clavulanate    "liver problems" highly questionable because he was in septic shock   Sulfur Rash      Medication List    TAKE these medications   amLODipine 5 MG tablet Commonly known as:  NORVASC Take 1 tablet (5 mg total) by mouth daily.   CALCIUM 600 600 MG Tabs tablet Generic drug:  calcium carbonate Take 600 mg by mouth 3 (three) times daily.   carvedilol 25 MG tablet Commonly known as:  COREG TAKE ONE TABLET TWICE DAILY   cyclobenzaprine 10 MG tablet Commonly known as:  FLEXERIL Take 10 mg by mouth every 8 (eight) hours as needed. For muscle pain.   Fish Oil 1000 MG Caps Take 1 capsule by mouth 3 (three) times daily.   fluticasone furoate-vilanterol 100-25  MCG/INH Aepb Commonly known as:  BREO ELLIPTA Inhale 1 puff into the lungs daily. Inhale 1 puff then rinse mouth, once daily   folic acid 1 MG tablet Commonly known as:   FOLVITE Take 1 mg by mouth daily.   gabapentin 300 MG capsule Commonly known as:  NEURONTIN Take 1 capsule by mouth 3 (three) times daily.   HYDROmorphone 2 MG tablet Commonly known as:  DILAUDID Take 2 mg by mouth every 4 (four) hours.   leflunomide 10 MG tablet Commonly known as:  ARAVA Take 20 mg by mouth daily.   levofloxacin 750 MG tablet Commonly known as:  LEVAQUIN Take 1 tablet (750 mg total) by mouth daily.   levothyroxine 150 MCG tablet Commonly known as:  SYNTHROID, LEVOTHROID Take 175 mcg by mouth daily.   losartan 50 MG tablet Commonly known as:  COZAAR Take 1 tablet (50 mg total) by mouth daily.   multivitamin with minerals Tabs tablet Take 1 tablet by mouth daily.   oxyCODONE-acetaminophen 10-325 MG tablet Commonly known as:  PERCOCET Take 1-2 tablets by mouth every 6 (six) hours as needed for pain.   potassium chloride SA 20 MEQ tablet Commonly known as:  K-DUR,KLOR-CON Take 20 mEq by mouth daily.   predniSONE 5 MG tablet Commonly known as:  DELTASONE Take 5 mg by mouth daily.   SYMBICORT 160-4.5 MCG/ACT inhaler Generic drug:  budesonide-formoterol USE TWO PUFFS TWICE DAILY   temazepam 15 MG capsule Commonly known as:  RESTORIL TAKE 1 OR 2 CAPSULES AT BEDTIME AS NEEDED FOR SLEEP.   triamcinolone cream 0.1 % Commonly known as:  KENALOG Apply topically.   Vitamin C 500 MG Chew Chew 500 mg by mouth daily.            Discharge Care Instructions        Start     Ordered   12/17/16 0000  amLODipine (NORVASC) 5 MG tablet  Daily     12/16/16 1135   12/17/16 0000  levofloxacin (LEVAQUIN) 750 MG tablet  Daily     12/16/16 1135     Follow-up Information    Georgianne Fick, MD. Schedule an appointment as soon as possible for a visit in 1 week(s).   Specialty:  Internal Medicine Contact information: [address] [place] [address] [phone]          Allergies  Allergen Reactions  . Amoxicillin-Pot  Clavulanate     "liver problems" highly questionable because he was in septic shock  . Sulfur Rash     Procedures/Studies: Dg Chest 2 View  Result Date: 12/13/2016 CLINICAL DATA:  Increasing weakness, fever, and shortness of breath tonight. Possible code sepsis. EXAM: CHEST  2 VIEW COMPARISON:  09/27/2013 FINDINGS: Shallow inspiration. Mild cardiac enlargement. Bilateral perihilar and upper lung infiltrates. These could represent multifocal pneumonia or edema. Aspiration could potentially have this appearance in the appropriate clinical setting. No blunting of costophrenic angles. No pneumothorax. Calcified and tortuous aorta. Postoperative changes in the lumbar spine. Degenerative changes in the shoulders. IMPRESSION: Bilateral perihilar and upper lung infiltrates suggesting multifocal pneumonia or edema. Electronically Signed   By: Burman Nieves M.D.   On: 12/13/2016 05:26   Ct Head Wo Contrast  Result Date: 12/13/2016 CLINICAL DATA:  Altered level consciousness. History of hypothyroidism, hypertension. EXAM: CT HEAD WITHOUT CONTRAST TECHNIQUE: Contiguous axial images were obtained from the base of the skull through the vertex without intravenous contrast. COMPARISON:  10/26/2007 FINDINGS: Brain: No evidence of acute infarction,  hemorrhage, extra-axial collection, ventriculomegaly, or mass effect. Generalized cerebral atrophy. Periventricular white matter low attenuation likely secondary to microangiopathy. Vascular: Cerebrovascular atherosclerotic calcifications are noted. Skull: Negative for fracture or focal lesion. Sinuses/Orbits: Visualized portions of the orbits are unremarkable. Visualized portions of the paranasal sinuses and mastoid air cells are unremarkable. Other: None. IMPRESSION: 1. No acute intracranial pathology. 2. Chronic microvascular disease and cerebral atrophy. Electronically Signed   By: Elige Ko   On: 12/13/2016 08:50       Subjective: Breathing much improved. No  overnight events.  Discharge Exam: Vitals:   12/16/16 0816 12/16/16 0824  BP:    Pulse: 73   Resp: 16   Temp:    SpO2: 96% 96%   Vitals:   12/15/16 2141 12/16/16 0454 12/16/16 0816 12/16/16 0824  BP: (!) 152/76 (!) 162/81    Pulse:  72 73   Resp:  16 16   Temp:  97.8 F (36.6 C)    TempSrc:  Oral    SpO2:  97% 96% 96%  Weight:      Height:       Gen.: Elderly male Not in distress HEENT: Moist mucosa, supple neck Chest: Fine bibasilar crackles (improved from yesterday) CVS: Normal S1 and S2, no murmurs GI: Soft, nondistended, nontender Musculoskeletal: Warm, no edema      The results of significant diagnostics from this hospitalization (including imaging, microbiology, ancillary and laboratory) are listed below for reference.     Microbiology: Recent Results (from the past 240 hour(s))  Culture, blood (Routine x 2)     Status: None (Preliminary result)   Collection Time: 12/13/16  5:05 AM  Result Value Ref Range Status   Specimen Description BLOOD RIGHT FOREARM  Final   Special Requests   Final    BOTTLES DRAWN AEROBIC AND ANAEROBIC Blood Culture adequate volume   Culture   Final    NO GROWTH 2 DAYS Performed at [facility], [address], [place], [place] [address]    Report Status PENDING  Incomplete  Culture, blood (Routine x 2)     Status: None (Preliminary result)   Collection Time: 12/13/16  5:05 AM  Result Value Ref Range Status   Specimen Description BLOOD LEFT FOREARM  Final   Special Requests   Final    BOTTLES DRAWN AEROBIC AND ANAEROBIC Blood Culture adequate volume   Culture   Final    NO GROWTH 2 DAYS Performed at [facility], [address] [place], [place] [address]    Report Status PENDING  Incomplete  MRSA PCR Screening     Status: None   Collection Time: 12/13/16  8:51 AM  Result Value Ref Range Status   MRSA by PCR NEGATIVE NEGATIVE Final    Comment:        The GeneXpert MRSA Assay (FDA approved for NASAL  specimens only), is one component of a comprehensive MRSA colonization surveillance program. It is not intended to diagnose MRSA infection nor to guide or monitor treatment for MRSA infections.   C difficile quick scan w PCR reflex     Status: None   Collection Time: 12/14/16  2:41 PM  Result Value Ref Range Status   C Diff antigen NEGATIVE NEGATIVE Final   C Diff toxin NEGATIVE NEGATIVE Final   C Diff interpretation No C. difficile detected.  Final     Labs: BNP (last 3 results) No results for input(s): BNP in the last 8760 hours. Basic Metabolic Panel:  Recent Labs Lab 12/13/16 0457 12/14/16 0411  NA 140 138  K 4.1 3.4*  CL 102 102  CO2 30 26  GLUCOSE 178* 122*  BUN 16 8  CREATININE 0.63 0.47*  CALCIUM 9.4 8.6*   Liver Function Tests:  Recent Labs Lab 12/13/16 0457  AST 30  ALT 17  ALKPHOS 102  BILITOT 0.7  PROT 6.4*  ALBUMIN 2.9*   No results for input(s): LIPASE, AMYLASE in the last 168 hours. No results for input(s): AMMONIA in the last 168 hours. CBC:  Recent Labs Lab 12/13/16 0457 12/14/16 0411  WBC 10.6* 13.5*  NEUTROABS 9.1*  --   HGB 10.0* 10.0*  HCT 31.4* 30.9*  MCV 90.2 88.3  PLT 217 231   Cardiac Enzymes: No results for input(s): CKTOTAL, CKMB, CKMBINDEX, TROPONINI in the last 168 hours. BNP: Invalid input(s): POCBNP CBG:  Recent Labs Lab 12/13/16 1619 12/13/16 2309 12/14/16 0731 12/14/16 1506  GLUCAP 187* 179* 188* 304*   D-Dimer No results for input(s): DDIMER in the last 72 hours. Hgb A1c No results for input(s): HGBA1C in the last 72 hours. Lipid Profile No results for input(s): CHOL, HDL, LDLCALC, TRIG, CHOLHDL, LDLDIRECT in the last 72 hours. Thyroid function studies No results for input(s): TSH, T4TOTAL, T3FREE, THYROIDAB in the last 72 hours.  Invalid input(s): FREET3 Anemia work up No results for input(s): VITAMINB12, FOLATE, FERRITIN, TIBC, IRON, RETICCTPCT in the last 72 hours. Urinalysis    Component  Value Date/Time   COLORURINE YELLOW 12/13/2016 0807   APPEARANCEUR CLEAR 12/13/2016 0807   LABSPEC 1.021 12/13/2016 0807   PHURINE 5.0 12/13/2016 0807   GLUCOSEU NEGATIVE 12/13/2016 0807   HGBUR NEGATIVE 12/13/2016 0807   BILIRUBINUR NEGATIVE 12/13/2016 0807   KETONESUR NEGATIVE 12/13/2016 0807   PROTEINUR NEGATIVE 12/13/2016 0807   UROBILINOGEN 0.2 12/24/2010 1016   NITRITE NEGATIVE 12/13/2016 0807   LEUKOCYTESUR NEGATIVE 12/13/2016 0807   Sepsis Labs Invalid input(s): PROCALCITONIN,  WBC,  LACTICIDVEN Microbiology Recent Results (from the past 240 hour(s))  Culture, blood (Routine x 2)     Status: None (Preliminary result)   Collection Time: 12/13/16  5:05 AM  Result Value Ref Range Status   Specimen Description BLOOD RIGHT FOREARM  Final   Special Requests   Final    BOTTLES DRAWN AEROBIC AND ANAEROBIC Blood Culture adequate volume   Culture   Final    NO GROWTH 2 DAYS Performed at [facility], [address] [place], [place] [address]    Report Status PENDING  Incomplete  Culture, blood (Routine x 2)     Status: None (Preliminary result)   Collection Time: 12/13/16  5:05 AM  Result Value Ref Range Status   Specimen Description BLOOD LEFT FOREARM  Final   Special Requests   Final    BOTTLES DRAWN AEROBIC AND ANAEROBIC Blood Culture adequate volume   Culture   Final    NO GROWTH 2 DAYS Performed at [facility], [address] [place], [place] [address]    Report Status PENDING  Incomplete  MRSA PCR Screening     Status: None   Collection Time: 12/13/16  8:51 AM  Result Value Ref Range Status   MRSA by PCR NEGATIVE NEGATIVE Final    Comment:        The GeneXpert MRSA Assay (FDA approved for NASAL specimens only), is one component of a comprehensive MRSA colonization surveillance program. It is not intended to diagnose MRSA infection nor to guide or  monitor treatment for MRSA infections.   C difficile quick scan w PCR reflex     Status: None    Collection Time: 12/14/16  2:41 PM  Result Value Ref Range Status   C Diff antigen NEGATIVE NEGATIVE Final   C Diff toxin NEGATIVE NEGATIVE Final   C Diff interpretation No C. difficile detected.  Final     Time coordinating discharge: Over 30 minutes  SIGNED:   Eddie North, MD  [facility] 12/16/2016, 11:38 AM Pager   If 7PM-7AM, please contact night-coverage [email or web address] Password [email or web address]

## 2016-12-16 NOTE — Evaluation (Signed)
Occupational Therapy Evaluation Patient Details Name: Johnny Patrick MRN: [id number] DOB: February 07, 1946 Today's Date: 12/16/2016    History of Present Illness 71 yo male admitted with sepsis due to pna, acute encephalopathy. hx of hypothyroidism, back sg, RA   Clinical Impression   Pt at baseline with ADL activity . Pt has a tub bench and a elevated toilet seat.    Follow Up Recommendations  No OT follow up    Equipment Recommendations  None recommended by OT       Precautions / Restrictions Precautions Precautions: Fall Precaution Comments: L BKA-uses prosthesis Restrictions Weight Bearing Restrictions: No      Mobility Bed Mobility Overal bed mobility: (P) Needs Assistance Bed Mobility: (P) Supine to Sit     Supine to sit: (P) Supervision;HOB elevated     General bed mobility comments: pt in chair  Transfers Overall transfer level: Needs assistance Equipment used: Rolling walker (2 wheeled) Transfers: Sit to/from [employer] Sit to Stand: Supervision Stand pivot transfers: Supervision       General transfer comment: (P) close guard for safety. VCs safety, hand placement.     Balance Overall balance assessment: Needs assistance   Sitting balance-Leahy Scale: Good     Standing balance support: Bilateral upper extremity supported Standing balance-Leahy Scale: Good                             ADL either performed or assessed with clinical judgement   ADL Overall ADL's : At baseline                                             Vision Patient Visual Report: No change from baseline              Pertinent Vitals/Pain Pain Assessment: No/denies pain     Hand Dominance     Extremity/Trunk Assessment Upper Extremity Assessment Upper Extremity Assessment: Overall WFL for tasks assessed       Cervical / Trunk Assessment Cervical / Trunk Assessment: Normal   Communication Communication Communication:  No difficulties   Cognition Arousal/Alertness: Awake/alert Behavior During Therapy: WFL for tasks assessed/performed Overall Cognitive Status: Within Functional Limits for tasks assessed                                                Home Living Family/patient expects to be discharged to:: Private residence Living Arrangements: Spouse/significant other Available Help at Discharge: Family Type of Home: House Home Access: Stairs to enter Secretary/administrator of Steps: 2 Entrance Stairs-Rails: None Home Layout: One level;Laundry or work area in basement     [employer] Shower/Tub: Dietitian: Environmental consultant - 2 wheels;Cane - single point;Bedside commode;Shower seat;Adaptive equipment;Walker - 4 wheels          Prior Functioning/Environment Level of Independence: Independent with assistive device(s)        Comments: uses rollator vs cane for ambulation                 OT Goals(Current goals can be found in the care plan section) Acute Rehab OT Goals Patient Stated Goal: home soon. regain PLOF.  OT Frequency:     Barriers to D/C:               AM-PAC PT "6 Clicks" Daily Activity     Outcome Measure Help from another person eating meals?: None Help from another person taking care of personal grooming?: None Help from another person toileting, which includes using toliet, bedpan, or urinal?: None Help from another person bathing (including washing, rinsing, drying)?: None Help from another person to put on and taking off regular upper body clothing?: None Help from another person to put on and taking off regular lower body clothing?: None 6 Click Score: 24   End of Session Equipment Utilized During Treatment: Rolling walker Nurse Communication: Mobility status  Activity Tolerance: Patient tolerated treatment well Patient left: in chair                   Time: 1053-1104 OT Time Calculation (min): 11 min Charges:   OT General Charges $OT Visit: 1 Visit OT Evaluation $OT Eval Moderate Complexity: 1 Mod G-Codes:     Lise Auer, OT [phone]  Einar Crow D 12/16/2016, 12:21 PM

## 2016-12-16 NOTE — Progress Notes (Signed)
Physical Therapy Treatment Patient Details Name: Johnny Patrick MRN: [id number] DOB: 11-08-1945 Today's Date: 12/16/2016    History of Present Illness 71 yo male admitted with sepsis due to pna, acute encephalopathy. hx of hypothyroidism, back sg, RA    PT Comments    The patient is progressing well. Oxygen saturation > 90 % so no needs for O2. Plans Dc today.   Follow Up Recommendations  No PT follow up (per patient)     Equipment Recommendations  None recommended by PT    Recommendations for Other Services       Precautions / Restrictions Precautions Precautions: Fall Precaution Comments: L BKA-uses prosthesis Restrictions Weight Bearing Restrictions: No    Mobility  Bed Mobility Overal bed mobility: Modified Independent Bed Mobility: (P) Supine to Sit     Supine to sit: (P) Supervision;HOB elevated     General bed mobility comments: pt in chair  Transfers Overall transfer level: Needs assistance Equipment used: Rolling walker (2 wheeled) Transfers: Sit to/from Stand Sit to Stand: Supervision Stand pivot transfers: Supervision       General transfer comment: (P) close guard for safety. VCs safety, hand placement.   Ambulation/Gait Ambulation/Gait assistance: Supervision Ambulation Distance (Feet): 200 Feet Assistive device: Rolling walker (2 wheeled) Gait Pattern/deviations: Step-through pattern     General Gait Details: close guard for safety.  O 2 sats >91%   Stairs            Wheelchair Mobility    Modified Rankin (Stroke Patients Only)       Balance Overall balance assessment: Needs assistance   Sitting balance-Leahy Scale: Good     Standing balance support: Bilateral upper extremity supported;During functional activity Standing balance-Leahy Scale: Good                              Cognition Arousal/Alertness: Awake/alert Behavior During Therapy: WFL for tasks assessed/performed Overall Cognitive Status: Within  Functional Limits for tasks assessed                                        Exercises      General Comments        Pertinent Vitals/Pain Pain Assessment: Faces Faces Pain Scale: Hurts a little bit Pain Location: neck and back Pain Descriptors / Indicators: Cramping Pain Intervention(s): Monitored during session    Home Living Family/patient expects to be discharged to:: Private residence Living Arrangements: Spouse/significant other Available Help at Discharge: Family Type of Home: House Home Access: Stairs to enter Entrance Stairs-Rails: None Home Layout: One level;Laundry or work area in [employer] Equipment: Environmental consultant - 2 wheels;Cane - single point;Bedside commode;Shower seat;Adaptive equipment;Walker - 4 wheels      Prior Function Level of Independence: Independent with assistive device(s)      Comments: uses rollator vs cane for ambulation   PT Goals (current goals can now be found in the care plan section) Acute Rehab PT Goals Patient Stated Goal: home soon. regain PLOF.  Progress towards PT goals: Progressing toward goals    Frequency    Min 3X/week      PT Plan Current plan remains appropriate    Co-evaluation              AM-PAC PT "6 Clicks" Daily Activity  Outcome Measure  Difficulty turning over in bed (including adjusting bedclothes, sheets  and blankets)?: None Difficulty moving from lying on back to sitting on the side of the bed? : None Difficulty sitting down on and standing up from a chair with arms (e.g., wheelchair, bedside commode, etc,.)?: A Little Help needed moving to and from a bed to chair (including a wheelchair)?: A Little Help needed walking in hospital room?: A Little Help needed climbing 3-5 steps with a railing? : A Little 6 Click Score: 20    End of Session Equipment Utilized During Treatment: Gait belt   Patient left: in chair (with OT)   PT Visit Diagnosis: Muscle weakness (generalized)  (M62.81);Difficulty in walking, not elsewhere classified (R26.2)     Time: 9937-1696 PT Time Calculation (min) (ACUTE ONLY): 33 min  Charges:  $Gait Training: 23-37 mins                    G CodesBlanchard Patrick PT [phone]    Johnny Patrick 12/16/2016, 1:46 PM

## 2016-12-16 NOTE — Discharge Instructions (Signed)
Community-Acquired Pneumonia, Adult °Pneumonia is an infection of the lungs. There are different types of pneumonia. One type can develop while a person is in a hospital. A different type, called community-acquired pneumonia, develops in people who are not, or have not recently been, in the hospital or other health care facility. °What are the causes? °Pneumonia may be caused by bacteria, viruses, or funguses. Community-acquired pneumonia is often caused by Streptococcus pneumonia bacteria. These bacteria are often passed from one person to another by breathing in droplets from the cough or sneeze of an infected person. °What increases the risk? °The condition is more likely to develop in: °· People who have chronic diseases, such as chronic obstructive pulmonary disease (COPD), asthma, congestive heart failure, cystic fibrosis, diabetes, or kidney disease. °· People who have early-stage or late-stage HIV. °· People who have sickle cell disease. °· People who have had their spleen removed (splenectomy). °· People who have poor dental hygiene. °· People who have medical conditions that increase the risk of breathing in (aspirating) secretions their own mouth and nose. °· People who have a weakened immune system (immunocompromised). °· People who smoke. °· People who travel to areas where pneumonia-causing germs commonly exist. °· People who are around animal habitats or animals that have pneumonia-causing germs, including birds, bats, rabbits, cats, and farm animals. ° °What are the signs or symptoms? °Symptoms of this condition include: °· A dry cough. °· A wet (productive) cough. °· Fever. °· Sweating. °· Chest pain, especially when breathing deeply or coughing. °· Rapid breathing or difficulty breathing. °· Shortness of breath. °· Shaking chills. °· Fatigue. °· Muscle aches. ° °How is this diagnosed? °Your health care provider will take a medical history and perform a physical exam. You may also have other tests,  including: °· Imaging studies of your chest, including X-rays. °· Tests to check your blood oxygen level and other blood gases. °· Other tests on blood, mucus (sputum), fluid around your lungs (pleural fluid), and urine. ° °If your pneumonia is severe, other tests may be done to identify the specific cause of your illness. °How is this treated? °The type of treatment that you receive depends on many factors, such as the cause of your pneumonia, the medicines you take, and other medical conditions that you have. For most adults, treatment and recovery from pneumonia may occur at home. In some cases, treatment must happen in a hospital. Treatment may include: °· Antibiotic medicines, if the pneumonia was caused by bacteria. °· Antiviral medicines, if the pneumonia was caused by a virus. °· Medicines that are given by mouth or through an IV tube. °· Oxygen. °· Respiratory therapy. ° °Although rare, treating severe pneumonia may include: °· Mechanical ventilation. This is done if you are not breathing well on your own and you cannot maintain a safe blood oxygen level. °· Thoracentesis. This procedure removes fluid around one lung or both lungs to help you breathe better. ° °Follow these instructions at home: °· Take over-the-counter and prescription medicines only as told by your health care provider. °? Only take cough medicine if you are losing sleep. Understand that cough medicine can prevent your body’s natural ability to remove mucus from your lungs. °? If you were prescribed an antibiotic medicine, take it as told by your health care provider. Do not stop taking the antibiotic even if you start to feel better. °· Sleep in a semi-upright position at night. Try sleeping in a reclining chair, or place a few pillows under your head. °· Do not use tobacco products, including cigarettes, chewing   tobacco, and e-cigarettes. If you need help quitting, ask your health care provider. °· Drink enough water to keep your urine  clear or pale yellow. This will help to thin out mucus secretions in your lungs. °How is this prevented? °There are ways that you can decrease your risk of developing community-acquired pneumonia. Consider getting a pneumococcal vaccine if: °· You are older than 73 years of age. °· You are older than 73 years of age and are undergoing cancer treatment, have chronic lung disease, or have other medical conditions that affect your immune system. Ask your health care provider if this applies to you. ° °There are different types and schedules of pneumococcal vaccines. Ask your health care provider which vaccination option is best for you. °You may also prevent community-acquired pneumonia if you take these actions: °· Get an influenza vaccine every year. Ask your health care provider which type of influenza vaccine is best for you. °· Go to the dentist on a regular basis. °· Wash your hands often. Use hand sanitizer if soap and water are not available. ° °Contact a health care provider if: °· You have a fever. °· You are losing sleep because you cannot control your cough with cough medicine. °Get help right away if: °· You have worsening shortness of breath. °· You have increased chest pain. °· Your sickness becomes worse, especially if you are an older adult or have a weakened immune system. °· You cough up blood. °This information is not intended to replace advice given to you by your health care provider. Make sure you discuss any questions you have with your health care provider. °Document Released: 03/25/2005 Document Revised: 08/03/2015 Document Reviewed: 07/20/2014 °Elsevier Interactive Patient Education © 2017 [employer] ° °

## 2016-12-16 NOTE — Progress Notes (Signed)
Went over discharge instructions with patient and wife. Prescriptions were given to patient. IV site was removed. All questions answered.

## 2016-12-18 LAB — CULTURE, BLOOD (ROUTINE X 2)
CULTURE: NO GROWTH
Culture: NO GROWTH
Special Requests: ADEQUATE
Special Requests: ADEQUATE

## 2016-12-19 ENCOUNTER — Encounter (HOSPITAL_COMMUNITY): Payer: Self-pay

## 2016-12-19 ENCOUNTER — Inpatient Hospital Stay (HOSPITAL_COMMUNITY)
Admission: EM | Admit: 2016-12-19 | Discharge: 2016-12-23 | DRG: 193 | Disposition: A | Discharge status: Home / Self Care | Payer: Medicare Other | Attending: Internal Medicine | Admitting: Internal Medicine

## 2016-12-19 ENCOUNTER — Emergency Department (HOSPITAL_COMMUNITY): Payer: Medicare Other

## 2016-12-19 DIAGNOSIS — Z89519 Acquired absence of unspecified leg below knee: Secondary | ICD-10-CM | POA: Diagnosis not present

## 2016-12-19 DIAGNOSIS — R0902 Hypoxemia: Secondary | ICD-10-CM | POA: Diagnosis not present

## 2016-12-19 DIAGNOSIS — L4050 Arthropathic psoriasis, unspecified: Secondary | ICD-10-CM | POA: Diagnosis present

## 2016-12-19 DIAGNOSIS — I10 Essential (primary) hypertension: Secondary | ICD-10-CM | POA: Diagnosis present

## 2016-12-19 DIAGNOSIS — J440 Chronic obstructive pulmonary disease with acute lower respiratory infection: Secondary | ICD-10-CM | POA: Diagnosis present

## 2016-12-19 DIAGNOSIS — J181 Lobar pneumonia, unspecified organism: Secondary | ICD-10-CM | POA: Diagnosis not present

## 2016-12-19 DIAGNOSIS — R531 Weakness: Secondary | ICD-10-CM | POA: Diagnosis not present

## 2016-12-19 DIAGNOSIS — R1313 Dysphagia, pharyngeal phase: Secondary | ICD-10-CM | POA: Diagnosis present

## 2016-12-19 DIAGNOSIS — G9341 Metabolic encephalopathy: Secondary | ICD-10-CM | POA: Diagnosis present

## 2016-12-19 DIAGNOSIS — Z882 Allergy status to sulfonamides status: Secondary | ICD-10-CM | POA: Diagnosis not present

## 2016-12-19 DIAGNOSIS — Z87891 Personal history of nicotine dependence: Secondary | ICD-10-CM

## 2016-12-19 DIAGNOSIS — R4182 Altered mental status, unspecified: Secondary | ICD-10-CM | POA: Diagnosis not present

## 2016-12-19 DIAGNOSIS — J189 Pneumonia, unspecified organism: Secondary | ICD-10-CM | POA: Diagnosis not present

## 2016-12-19 DIAGNOSIS — Z880 Allergy status to penicillin: Secondary | ICD-10-CM | POA: Diagnosis not present

## 2016-12-19 DIAGNOSIS — Z7951 Long term (current) use of inhaled steroids: Secondary | ICD-10-CM | POA: Diagnosis not present

## 2016-12-19 DIAGNOSIS — Z96643 Presence of artificial hip joint, bilateral: Secondary | ICD-10-CM | POA: Diagnosis present

## 2016-12-19 DIAGNOSIS — E1165 Type 2 diabetes mellitus with hyperglycemia: Secondary | ICD-10-CM | POA: Diagnosis present

## 2016-12-19 DIAGNOSIS — R05 Cough: Secondary | ICD-10-CM | POA: Diagnosis not present

## 2016-12-19 DIAGNOSIS — D649 Anemia, unspecified: Secondary | ICD-10-CM

## 2016-12-19 DIAGNOSIS — Z981 Arthrodesis status: Secondary | ICD-10-CM

## 2016-12-19 DIAGNOSIS — Z79899 Other long term (current) drug therapy: Secondary | ICD-10-CM

## 2016-12-19 DIAGNOSIS — R9431 Abnormal electrocardiogram [ECG] [EKG]: Secondary | ICD-10-CM | POA: Diagnosis not present

## 2016-12-19 DIAGNOSIS — D638 Anemia in other chronic diseases classified elsewhere: Secondary | ICD-10-CM | POA: Diagnosis present

## 2016-12-19 DIAGNOSIS — J9601 Acute respiratory failure with hypoxia: Secondary | ICD-10-CM | POA: Diagnosis present

## 2016-12-19 DIAGNOSIS — R0602 Shortness of breath: Secondary | ICD-10-CM | POA: Diagnosis not present

## 2016-12-19 DIAGNOSIS — D899 Disorder involving the immune mechanism, unspecified: Secondary | ICD-10-CM

## 2016-12-19 DIAGNOSIS — Z7952 Long term (current) use of systemic steroids: Secondary | ICD-10-CM

## 2016-12-19 DIAGNOSIS — N179 Acute kidney failure, unspecified: Secondary | ICD-10-CM | POA: Diagnosis present

## 2016-12-19 DIAGNOSIS — D849 Immunodeficiency, unspecified: Secondary | ICD-10-CM | POA: Diagnosis present

## 2016-12-19 DIAGNOSIS — E1129 Type 2 diabetes mellitus with other diabetic kidney complication: Secondary | ICD-10-CM

## 2016-12-19 HISTORY — DX: Type 2 diabetes mellitus without complications: E11.9

## 2016-12-19 LAB — URINALYSIS, ROUTINE W REFLEX MICROSCOPIC
BILIRUBIN URINE: NEGATIVE
GLUCOSE, UA: 50 mg/dL — AB
HGB URINE DIPSTICK: NEGATIVE
KETONES UR: NEGATIVE mg/dL
Leukocytes, UA: NEGATIVE
Nitrite: NEGATIVE
PH: 5 (ref 5.0–8.0)
PROTEIN: NEGATIVE mg/dL
Specific Gravity, Urine: 1.015 (ref 1.005–1.030)

## 2016-12-19 LAB — COMPREHENSIVE METABOLIC PANEL
ALK PHOS: 107 U/L (ref 38–126)
ALT: 25 U/L (ref 17–63)
ANION GAP: 10 (ref 5–15)
AST: 31 U/L (ref 15–41)
Albumin: 2.8 g/dL — ABNORMAL LOW (ref 3.5–5.0)
BILIRUBIN TOTAL: 0.5 mg/dL (ref 0.3–1.2)
BUN: 43 mg/dL — ABNORMAL HIGH (ref 6–20)
CALCIUM: 9.1 mg/dL (ref 8.9–10.3)
CO2: 25 mmol/L (ref 22–32)
CREATININE: 1.35 mg/dL — AB (ref 0.61–1.24)
Chloride: 101 mmol/L (ref 101–111)
GFR calc Af Amer: 59 mL/min — ABNORMAL LOW (ref >60)
GFR calc non Af Amer: 50 mL/min — ABNORMAL LOW (ref >60)
Glucose, Bld: 308 mg/dL — ABNORMAL HIGH (ref 65–99)
Potassium: 5.2 mmol/L — ABNORMAL HIGH (ref 3.5–5.1)
Sodium: 136 mmol/L (ref 135–145)
TOTAL PROTEIN: 6.2 g/dL — AB (ref 6.5–8.1)

## 2016-12-19 LAB — GLUCOSE, CAPILLARY
Glucose-Capillary: 209 mg/dL — ABNORMAL HIGH (ref 65–99)
Glucose-Capillary: 155 mg/dL — ABNORMAL HIGH (ref 65–99)

## 2016-12-19 LAB — CBC WITH DIFFERENTIAL/PLATELET
BASOS ABS: 0 10*3/uL (ref 0.0–0.1)
Basophils Relative: 0 %
EOS PCT: 1 %
Eosinophils Absolute: 0.2 10*3/uL (ref 0.0–0.7)
HEMATOCRIT: 31.4 % — AB (ref 39.0–52.0)
Hemoglobin: 10.2 g/dL — ABNORMAL LOW (ref 13.0–17.0)
LYMPHS ABS: 1 10*3/uL (ref 0.7–4.0)
Lymphocytes Relative: 6 %
MCH: 28.4 pg (ref 26.0–34.0)
MCHC: 32.5 g/dL (ref 30.0–36.0)
MCV: 87.5 fL (ref 78.0–100.0)
MONO ABS: 0.3 10*3/uL (ref 0.1–1.0)
MONOS PCT: 2 %
NEUTROS ABS: 15.1 10*3/uL — AB (ref 1.7–7.7)
Neutrophils Relative %: 91 %
PLATELETS: 318 10*3/uL (ref 150–400)
RBC: 3.59 MIL/uL — AB (ref 4.22–5.81)
RDW: 15.4 % (ref 11.5–15.5)
WBC: 16.6 10*3/uL — AB (ref 4.0–10.5)

## 2016-12-19 LAB — EXPECTORATED SPUTUM ASSESSMENT W REFEX TO RESP CULTURE

## 2016-12-19 LAB — TROPONIN I: Troponin I: <0.03 ng/mL (ref <0.03)

## 2016-12-19 LAB — PROTIME-INR
INR: 1.01
Prothrombin Time: 13.2 seconds (ref 11.4–15.2)

## 2016-12-19 LAB — I-STAT CG4 LACTIC ACID, ED
Lactic Acid, Venous: 2.46 mmol/L (ref 0.5–1.9)
Lactic Acid, Venous: 1.22 mmol/L (ref 0.5–1.9)

## 2016-12-19 LAB — EXPECTORATED SPUTUM ASSESSMENT W GRAM STAIN, RFLX TO RESP C

## 2016-12-19 LAB — BRAIN NATRIURETIC PEPTIDE: B Natriuretic Peptide: 26.8 pg/mL (ref 0.0–100.0)

## 2016-12-19 LAB — MRSA PCR SCREENING: MRSA by PCR: NEGATIVE

## 2016-12-19 MED ORDER — METRONIDAZOLE IN NACL 5-0.79 MG/ML-% IV SOLN
500.0000 mg | Freq: Three times a day (TID) | INTRAVENOUS | Status: DC
  Administered 2016-12-19 – 2016-12-22 (×9): 500 mg via INTRAVENOUS
  Filled 2016-12-19 (×10): qty 100

## 2016-12-19 MED ORDER — DEXTROSE 5 % IV SOLN: 2.0000 g | Freq: Once | INTRAVENOUS | Status: DC

## 2016-12-19 MED ORDER — OXYCODONE-ACETAMINOPHEN 10-325 MG PO TABS: 1.0000 | ORAL_TABLET | Freq: Four times a day (QID) | ORAL | Status: DC | PRN

## 2016-12-19 MED ORDER — FLUTICASONE FUROATE-VILANTEROL 100-25 MCG/INH IN AEPB
1.0000 | INHALATION_SPRAY | Freq: Every day | RESPIRATORY_TRACT | Status: DC
  Administered 2016-12-20 – 2016-12-23 (×4): 1 via RESPIRATORY_TRACT
  Filled 2016-12-19: qty 28

## 2016-12-19 MED ORDER — LEFLUNOMIDE 20 MG PO TABS
20.0000 mg | ORAL_TABLET | Freq: Every day | ORAL | Status: DC
  Administered 2016-12-19 – 2016-12-23 (×5): 20 mg via ORAL
  Filled 2016-12-19 (×5): qty 1

## 2016-12-19 MED ORDER — DEXTROSE 5 % IV SOLN
2.0000 g | Freq: Once | INTRAVENOUS | Status: AC
  Administered 2016-12-19: 2 g via INTRAVENOUS
  Filled 2016-12-19: qty 2

## 2016-12-19 MED ORDER — INSULIN ASPART 100 UNIT/ML ~~LOC~~ SOLN
0.0000 [IU] | Freq: Three times a day (TID) | SUBCUTANEOUS | Status: DC
  Administered 2016-12-19: 3 [IU] via SUBCUTANEOUS
  Administered 2016-12-20: 1 [IU] via SUBCUTANEOUS
  Administered 2016-12-20: 7 [IU] via SUBCUTANEOUS

## 2016-12-19 MED ORDER — VANCOMYCIN HCL IN DEXTROSE 1-5 GM/200ML-% IV SOLN
1000.0000 mg | Freq: Once | INTRAVENOUS | Status: AC
  Administered 2016-12-19: 1000 mg via INTRAVENOUS
  Filled 2016-12-19: qty 200

## 2016-12-19 MED ORDER — GABAPENTIN 300 MG PO CAPS
300.0000 mg | ORAL_CAPSULE | Freq: Three times a day (TID) | ORAL | Status: DC
  Administered 2016-12-19 – 2016-12-23 (×13): 300 mg via ORAL
  Filled 2016-12-19 (×13): qty 1

## 2016-12-19 MED ORDER — CYCLOBENZAPRINE HCL 10 MG PO TABS
10.0000 mg | ORAL_TABLET | Freq: Three times a day (TID) | ORAL | Status: DC | PRN
  Administered 2016-12-20 – 2016-12-22 (×6): 10 mg via ORAL
  Filled 2016-12-19 (×6): qty 1

## 2016-12-19 MED ORDER — CARVEDILOL 25 MG PO TABS
25.0000 mg | ORAL_TABLET | Freq: Two times a day (BID) | ORAL | Status: DC
  Administered 2016-12-19 – 2016-12-23 (×8): 25 mg via ORAL
  Filled 2016-12-19 (×2): qty 1
  Filled 2016-12-19: qty 2
  Filled 2016-12-19 (×2): qty 1
  Filled 2016-12-19: qty 2
  Filled 2016-12-19 (×4): qty 1

## 2016-12-19 MED ORDER — ENOXAPARIN SODIUM 40 MG/0.4ML ~~LOC~~ SOLN
40.0000 mg | SUBCUTANEOUS | Status: DC
  Administered 2016-12-19 – 2016-12-22 (×4): 40 mg via SUBCUTANEOUS
  Filled 2016-12-19 (×4): qty 0.4

## 2016-12-19 MED ORDER — SODIUM CHLORIDE 0.9% FLUSH
3.0000 mL | Freq: Two times a day (BID) | INTRAVENOUS | Status: DC
  Administered 2016-12-19 – 2016-12-23 (×7): 3 mL via INTRAVENOUS

## 2016-12-19 MED ORDER — OXYCODONE-ACETAMINOPHEN 5-325 MG PO TABS
1.0000 | ORAL_TABLET | Freq: Four times a day (QID) | ORAL | Status: DC | PRN
  Administered 2016-12-19 – 2016-12-20 (×3): 1 via ORAL
  Administered 2016-12-20: 2 via ORAL
  Administered 2016-12-21: 1 via ORAL
  Administered 2016-12-21: 2 via ORAL
  Administered 2016-12-21: 1 via ORAL
  Administered 2016-12-21 – 2016-12-23 (×7): 2 via ORAL
  Filled 2016-12-19: qty 2
  Filled 2016-12-19: qty 1
  Filled 2016-12-19: qty 2
  Filled 2016-12-19: qty 1
  Filled 2016-12-19 (×4): qty 2
  Filled 2016-12-19: qty 1
  Filled 2016-12-19: qty 2
  Filled 2016-12-19: qty 1
  Filled 2016-12-19: qty 2
  Filled 2016-12-19: qty 1
  Filled 2016-12-19: qty 2

## 2016-12-19 MED ORDER — SODIUM CHLORIDE 0.9 % IV SOLN
INTRAVENOUS | Status: DC
  Administered 2016-12-19 – 2016-12-20 (×2): via INTRAVENOUS

## 2016-12-19 MED ORDER — SODIUM CHLORIDE 0.9 % IV BOLUS (SEPSIS)
1000.0000 mL | Freq: Once | INTRAVENOUS | Status: AC
  Administered 2016-12-19: 1000 mL via INTRAVENOUS

## 2016-12-19 MED ORDER — PREDNISONE 5 MG PO TABS
5.0000 mg | ORAL_TABLET | Freq: Every day | ORAL | Status: DC
  Administered 2016-12-19 – 2016-12-23 (×5): 5 mg via ORAL
  Filled 2016-12-19 (×5): qty 1

## 2016-12-19 MED ORDER — TEMAZEPAM 15 MG PO CAPS
15.0000 mg | ORAL_CAPSULE | Freq: Every day | ORAL | Status: DC
  Administered 2016-12-19 – 2016-12-22 (×4): 15 mg via ORAL
  Filled 2016-12-19 (×4): qty 1

## 2016-12-19 MED ORDER — OXYCODONE HCL 5 MG PO TABS
5.0000 mg | ORAL_TABLET | Freq: Four times a day (QID) | ORAL | Status: DC | PRN
  Administered 2016-12-19 – 2016-12-20 (×2): 5 mg via ORAL
  Administered 2016-12-20: 10 mg via ORAL
  Administered 2016-12-20: 5 mg via ORAL
  Administered 2016-12-21: 10 mg via ORAL
  Administered 2016-12-21: 5 mg via ORAL
  Administered 2016-12-21: 10 mg via ORAL
  Administered 2016-12-21: 5 mg via ORAL
  Administered 2016-12-22 – 2016-12-23 (×6): 10 mg via ORAL
  Filled 2016-12-19 (×2): qty 1
  Filled 2016-12-19 (×5): qty 2
  Filled 2016-12-19: qty 1
  Filled 2016-12-19: qty 2
  Filled 2016-12-19 (×2): qty 1
  Filled 2016-12-19 (×3): qty 2

## 2016-12-19 MED ORDER — SODIUM CHLORIDE 0.9 % IV BOLUS (SEPSIS)
1000.0000 mL | Freq: Once | INTRAVENOUS | Status: AC
Start: 2016-12-19 — End: 2016-12-19
  Administered 2016-12-19: 1000 mL via INTRAVENOUS

## 2016-12-19 MED ORDER — LOSARTAN POTASSIUM 50 MG PO TABS
50.0000 mg | ORAL_TABLET | Freq: Every day | ORAL | Status: DC
  Administered 2016-12-19 – 2016-12-23 (×5): 50 mg via ORAL
  Filled 2016-12-19 (×6): qty 1

## 2016-12-19 MED ORDER — HYDROMORPHONE HCL 2 MG PO TABS
2.0000 mg | ORAL_TABLET | ORAL | Status: DC | PRN
  Administered 2016-12-20 – 2016-12-22 (×6): 2 mg via ORAL
  Filled 2016-12-19 (×6): qty 1

## 2016-12-19 MED ORDER — SODIUM CHLORIDE 0.9 % IV SOLN
500.0000 mg | Freq: Two times a day (BID) | INTRAVENOUS | Status: DC
Start: 2016-12-19 — End: 2016-12-20
  Administered 2016-12-19 – 2016-12-20 (×2): 500 mg via INTRAVENOUS
  Filled 2016-12-19 (×4): qty 500

## 2016-12-19 MED ORDER — LEVOTHYROXINE SODIUM 50 MCG PO TABS
175.0000 ug | ORAL_TABLET | Freq: Every day | ORAL | Status: DC
  Administered 2016-12-20 – 2016-12-23 (×4): 175 ug via ORAL
  Filled 2016-12-19 (×5): qty 1

## 2016-12-19 MED ORDER — AMLODIPINE BESYLATE 5 MG PO TABS
5.0000 mg | ORAL_TABLET | Freq: Every day | ORAL | Status: DC
  Administered 2016-12-19 – 2016-12-23 (×5): 5 mg via ORAL
  Filled 2016-12-19 (×5): qty 1

## 2016-12-19 NOTE — ED Notes (Signed)
Bed: WA18 Expected date:  Expected time:  Means of arrival:  Comments: ResA 

## 2016-12-19 NOTE — ED Notes (Signed)
Spoke with Dr. Rito Ehrlich regarding patients oxygen status. Upon assessment at 0730 patient was on 15L nonrebreather mask o2 sat 100%. Spoke with respiratory and patient switched to 5L [place] o2 sat 95%. Dr. Rito Ehrlich aware.

## 2016-12-19 NOTE — Progress Notes (Signed)
Pharmacy Antibiotic Note  Johnny Patrick is a 71 y.o. male recently hospitalized for multilobar PNA and sepsis, presented to the ED on 12/19/2016 with generalized weakness.  To start vancomycin and flagyl for suspected LLL PNA and aspiration.  Plan: - vancomycin 1000 mg IV x1, then 500 mg IV q12h - flagyl 500 mg IV q8h per MD  _________________________________  Height: 5\' 7"  (170.2 cm) Weight: 139 lb (63 kg) IBW/kg (Calculated) : 66.1  Temp (24hrs), Avg:98.6 F (37 C), Min:97.7 F (36.5 C), Max:99.5 F (37.5 C)   Recent Labs Lab 12/13/16 0457 12/13/16 0521 12/13/16 0816 12/14/16 0411 12/19/16 0447 12/19/16 0508 12/19/16 0906  WBC 10.6*  --   --  13.5* 16.6*  --   --   CREATININE 0.63  --   --  0.47* 1.35*  --   --   LATICACIDVEN  --  1.74 1.03  --   --  2.46* 1.22    Estimated Creatinine Clearance: 43.5 mL/min (A) (by C-G formula based on SCr of 1.35 mg/dL (H)).    Allergies  Allergen Reactions  . Amoxicillin-Pot Clavulanate     "liver problems" highly questionable because he was in septic shock  . Sulfur Rash     Thank you for allowing pharmacy to be a part of this patient's care.  12/21/16 12/19/2016 2:58 PM

## 2016-12-19 NOTE — ED Provider Notes (Signed)
[facility]-EMERGENCY DEPT Provider Note   CSN: [id number] Arrival date & time: 12/19/16  0431     History   Chief Complaint Chief Complaint  Patient presents with  . Weakness    HPI Johnny Patrick is a 71 y.o. male.  The history is provided by the spouse. The history is limited by the condition of the patient (Altered mental status).  He was recently admitted to the hospital for pneumonia, discharged 3 days ago. Wife states that he was very weak when discharged. He was discharged with a prescription for levofloxacin. He completed that course 2 days ago, but his wife was uncomfortable with his clinical status and called his PCP who called an additional levofloxacin. He did get a dose of levofloxacin yesterday. This evening, he seemed much weaker and was confused. She states that he felt warm to her, but he had no chills or sweats. There is no documented fever. He has had a cough which is nonproductive. He denies chest pain, heaviness, tightness, pressure. Also, he is somewhat immunosuppressed because of chronic prednisone therapy for psoriatic arthritis.  Past Medical History:  Diagnosis Date  . Anemia   . Anemia, chronic disease 10/14/2011  . 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 Chronic airway obstruction, not elsewhere classified   . Cryptococcus (HCC) 2008   left arm  . Diabetic foot ulcer (HCC) 09/18/2016  . Esophageal reflux   . Hypothyroidism   . Low back pain   . MRSA (methicillin resistant Staphylococcus aureus)   . MSSA (methicillin susceptible Staphylococcus aureus) septicemia (HCC)    L 2nd toe-amputated June 2011  . Nephrolithiasis   . Osteoarthritis   . Psoriatic arthritis (HCC)   . Psoriatic arthritis (HCC) 10/14/2011  . Shingles    x2  . Sleep apnea    cpap  . Unspecified essential hypertension     Patient Active Problem List   Diagnosis Date Noted  . Community acquired pneumonia 12/16/2016  . Uncontrolled hypertension 12/16/2016  . Hypokalemia 12/16/2016    . Acute metabolic encephalopathy 12/13/2016  . Acute respiratory failure with hypoxia (HCC)   . Multifocal pneumonia   . 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 Diabetic foot ulcer (HCC) 09/18/2016  . Preoperative clearance 09/09/2012  . Arthritis, septic, ankle or foot (HCC) 08/19/2012  . Left ankle swelling 01/22/2012  . Anemia, chronic disease 10/14/2011  . Rheumatoid arthritis (HCC) 10/14/2011  . Psoriatic arthritis (HCC) 10/14/2011  . Confusion caused by a drug (HCC) 06/20/2011  . HTN (hypertension) 05/09/2011  . Leg edema 04/24/2011  . Cellulitis, leg 04/24/2011  . Cellulitis and abscess of leg 04/24/2011  . Immunosuppression (HCC) 04/24/2011  . Insomnia, chronic 02/27/2011  . CELLULITIS, HAND, RIGHT 05/23/2010  . 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 LUNG NODULE 07/18/2009  . ABDOMINAL/PELVIC SWELLING MASS/LUMP UNSPEC SITE 07/18/2009  . HYPERLIPIDEMIA 06/30/2009  . DEGENERATIVE DISC DISEASE 06/30/2009  . HIP REPLACEMENT, LEFT, HX OF 06/30/2009  . OTHER POSTSURGICAL STATUS OTHER 06/30/2009  . FEVER UNSPECIFIED 06/14/2009  . ALLERGIC RHINITIS 08/02/2007  . CRYPTOCOCCOSIS 12/15/2006  . HYPOTHYROIDISM 12/15/2006  . Obstructive sleep apnea 12/15/2006  . HYPERTENSION 12/15/2006  . 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 GERD 12/15/2006  . PSORIATIC ARTHRITIS 12/15/2006  . OSTEOARTHRITIS 12/15/2006  . LOW BACK PAIN 12/15/2006  . Edema 12/15/2006  . NEPHROLITHIASIS, HX OF 12/15/2006  . Personal history of tobacco use, presenting hazards to health 12/15/2006    Past Surgical History:  Procedure Laterality Date  . BACK  SURGERY     3 fusions, 4th - undo 3rd fusion  . THORACIC SPINE SURGERY    . TOE AMPUTATION    . TOE SURGERY  2009   little toe on right foot , bone removed  . TOTAL HIP ARTHROPLASTY     bilateral       Home Medications    Prior to Admission medications   Medication Sig Start Date End Date Taking? Authorizing Provider  amLODipine  (NORVASC) 5 MG tablet Take 1 tablet (5 mg total) by mouth daily. 12/17/16   Dhungel, Theda Belfast, MD  Ascorbic Acid (VITAMIN C) 500 MG CHEW Chew 500 mg by mouth daily.      [provider]  calcium carbonate (CALCIUM 600) 600 MG TABS Take 600 mg by mouth 3 (three) times daily.      [provider]  carvedilol (COREG) 25 MG tablet TAKE ONE TABLET TWICE DAILY 09/27/12   Wall, Jesse Sans, MD  cyclobenzaprine (FLEXERIL) 10 MG tablet Take 10 mg by mouth every 8 (eight) hours as needed. For muscle pain.    [provider]  fluticasone furoate-vilanterol (BREO ELLIPTA) 100-25 MCG/INH AEPB Inhale 1 puff into the lungs daily. Inhale 1 puff then rinse mouth, once daily 10/05/15   Jetty Duhamel D, MD  folic acid (FOLVITE) 1 MG tablet Take 1 mg by mouth daily.      [provider]  gabapentin (NEURONTIN) 300 MG capsule Take 1 capsule by mouth 3 (three) times daily. 12/05/16   [provider]  HYDROmorphone (DILAUDID) 2 MG tablet Take 2 mg by mouth every 4 (four) hours.      [provider]  leflunomide (ARAVA) 10 MG tablet Take 20 mg by mouth daily.  09/21/14   [provider]  levofloxacin (LEVAQUIN) 750 MG tablet Take 1 tablet (750 mg total) by mouth daily. 12/17/16 12/19/16  Dhungel, Theda Belfast, MD  levothyroxine (SYNTHROID, LEVOTHROID) 150 MCG tablet Take 175 mcg by mouth daily.     [provider]  losartan (COZAAR) 50 MG tablet Take 1 tablet (50 mg total) by mouth daily. 09/01/12   Wall, Jesse Sans, MD  Multiple Vitamin (MULITIVITAMIN WITH MINERALS) TABS Take 1 tablet by mouth daily.    [provider]  Omega-3 Fatty Acids (FISH OIL) 1000 MG CAPS Take 1 capsule by mouth 3 (three) times daily.     [provider]  oxyCODONE-acetaminophen (PERCOCET) 10-325 MG per tablet Take 1-2 tablets by mouth every 6 (six) hours as needed for pain.     [provider]  potassium chloride SA (K-DUR,KLOR-CON) 20 MEQ tablet Take 20 mEq by mouth  daily.    [provider]  predniSONE (DELTASONE) 5 MG tablet Take 5 mg by mouth daily.      [provider]  SYMBICORT 160-4.5 MCG/ACT inhaler USE TWO PUFFS TWICE DAILY 04/04/15   Jetty Duhamel D, MD  temazepam (RESTORIL) 15 MG capsule TAKE 1 OR 2 CAPSULES AT BEDTIME AS NEEDED FOR SLEEP. 03/26/16   Jetty Duhamel D, MD  triamcinolone cream (KENALOG) 0.1 % Apply topically.    [provider]    Family History Family History  Problem Relation Age of Onset  . Coronary artery disease Mother   . Pneumonia Mother   . Heart failure Mother   . Breast cancer Mother   . Psoriasis Father   . Coronary artery disease Brother   . Colon cancer Neg Hx   . Stomach cancer Neg Hx  Social History Social History  Substance Use Topics  . Smoking status: Former Smoker    Types: Cigarettes    Quit date: 04/24/1991  . Smokeless tobacco: Never Used  . Alcohol use No     Allergies   Amoxicillin-pot clavulanate and Sulfur   Review of Systems Review of Systems  Unable to perform ROS: Mental status change     Physical Exam Updated Vital Signs BP 112/62 (BP Location: Left Arm)   Pulse 92   Temp 97.7 F (36.5 C) (Oral)   Resp 14   Ht  (1.702 m)   Wt 63 kg (139 lb)   SpO2 100%   BMI 21.77 kg/m   Physical Exam  Nursing note and vitals reviewed.  71 year old male, resting comfortably and in no acute distress. Vital signs are normal. Oxygen saturation is 100%, which is normal. Head is normocephalic and atraumatic. PERRLA, EOMI. Oropharynx is clear. Neck is nontender and supple without adenopathy or JVD. Back is nontender and there is no CVA tenderness. Lungs have by basilar rales without wheezes or rhonchi. Chest is nontender. Heart has regular rate and rhythm without murmur. Abdomen is soft, flat, nontender without masses or hepatosplenomegaly and peristalsis is normoactive. Extremities:left below-the-knee amputation. Skin is warm and dry without  rash. Neurologic: He is awake, but slow to respond. He is oriented to person, place, time. While he is generally weak, there is no focal weakness.  ED Treatments / Results  Labs (all labs ordered are listed, but only abnormal results are displayed) Labs Reviewed  CULTURE, BLOOD (ROUTINE X 2)  CULTURE, BLOOD (ROUTINE X 2)  COMPREHENSIVE METABOLIC PANEL  CBC WITH DIFFERENTIAL/PLATELET  PROTIME-INR  URINALYSIS, ROUTINE W REFLEX MICROSCOPIC  I-STAT CG4 LACTIC ACID, ED    EKG  EKG Interpretation  Date/Time:  Thursday December 19 2016 04:37:48 EDT Ventricular Rate:  90 PR Interval:    QRS Duration: 93 QT Interval:  366 QTC Calculation: 448 R Axis:   -47 Text Interpretation:  Sinus rhythm Probable left atrial enlargement Left anterior fascicular block Consider anterior infarct Borderline T abnormalities, inferior leads When compared with ECG of 12/13/2016, No significant change was found Confirmed by Dione Booze ([id number]) on 12/19/2016 4:47:32 AM       Radiology No results found.  Procedures Procedures (including critical care time) CRITICAL CARE Performed by: BJYNW,GNFAO Total critical care time: 40 minutes Critical care time was exclusive of separately billable procedures and treating other patients. Critical care was necessary to treat or prevent imminent or life-threatening deterioration. Critical care was time spent personally by me on the following activities: development of treatment plan with patient and/or surrogate as well as nursing, discussions with consultants, evaluation of patient's response to treatment, examination of patient, obtaining history from patient or surrogate, ordering and performing treatments and interventions, ordering and review of laboratory studies, ordering and review of radiographic studies, pulse oximetry and re-evaluation of patient's condition.  Medications Ordered in ED Medications - No data to display   Initial Impression / Assessment and  Plan / ED Course  I have reviewed the triage vital signs and the nursing notes.  Pertinent labs & imaging results that were available during my care of the patient were reviewed by me and considered in my medical decision making (see chart for details).  Dyspnea and weakness in setting of recent bout of pneumonia. Old records are reviewed confirming recent hospitalization for pneumonia. Initial lactic acid is somewhat elevated, so he is started on early goal-directed  fluid treatment. His decline well taking antibiotics requires a change in antibiotics. While he does not appear to be in septic shock, he is started on the sepsis pathway and is started on antibiotics for healthcare associated pneumonia.  hest x-ray actually shows almost complete clearing of his bilateral pneumonia.WBC is elevated, but this is nonspecific. Metabolic panel shows a significant increase in BUN and creatinine in a pattern suggestive of prerenal azotemia. Urinalysis is pending. nemias present and unchanged from baseline. Case is discussed with Dr. Rito Ehrlich of [facility] who agrees to admit the patient.  Final Clinical Impressions(s) / ED Diagnoses   Final diagnoses:  Altered mental status, unspecified altered mental status type  Acute kidney injury (nontraumatic) (HCC)  Normochromic normocytic anemia    New Prescriptions New Prescriptions   No medications on file     Dione Booze, MD 12/19/16 [phone]

## 2016-12-19 NOTE — ED Triage Notes (Signed)
Admitted here for pneumonia and discharged on Monday to home and now wife states weakness EMS found with 76% o2 sat placed on NRB mask with 100.0 axillary temp per EMS pt complained of abdominal pain.

## 2016-12-19 NOTE — ED Notes (Signed)
Bed: RESA Expected date:  Expected time:  Means of arrival:  Comments: 74 m weakness

## 2016-12-19 NOTE — H&P (Signed)
History and Physical  Johnny Patrick:[id number] DOB: March 01, 1946 DOA: 12/19/2016  PCP: Georgianne Fick, MD  Patient coming from: home  Chief Complaint: weak  HPI:  71 year old man PMH psoriatic arthritis on chronic prednisone, recently hospitalized for acute hypoxic respiratory failure secondary to multilobar pneumonia, sepsis, presented to the emergency department with generalized weakness, poor oral intake and confusion. He was placed on nonrebreather mask. Admitted for acute hypoxic respiratory failure, acute kidney injury, suspected new left lobar pneumonia, concerning for aspiration.  Patient denies any complaints at the present moment. No pain, no shortness of breath. He reports feeling "weak" since discharge from the hospital 9/10. He did complete antibiotics. He has had a cough but no shortness of breath. No focal weakness noted. He reports appetite and fluid intake has been baseline. Wife at bedside disputes this. She reports since discharge she is being gradually becoming more weak in general. No significant focal deficits noted. No dysarthria or difficulty swallowing reported. No aspiration noted. She has noted a cough and episodes of confusion that began early this morning. Because of generalized weakness, confusion and cough she brought him to the emergency department for further evaluation. No specific aggravating or alleviating factors.  ED Course: Afebrile, SBP initially 92/60, now 148/75. Initially on nonrebreather 100%. Now on nasal cannula, 100%. Treated with vancomycin, cefepime.  Review of Systems:   Negative for fever, new visual changes, sore throat, rash, new muscle aches, chest pain, SOB, dysuria, bleeding, n/v/abdominal pain as per patient.  Past Medical History:  Diagnosis Date  . Anemia   . Anemia, chronic disease 10/14/2011  . 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 Chronic airway obstruction, not elsewhere classified   . Cryptococcus (HCC) 2008   left  arm  . Diabetes (HCC)   . Diabetic foot ulcer (HCC) 09/18/2016  . Esophageal reflux   . Hypothyroidism   . Low back pain   . MRSA (methicillin resistant Staphylococcus aureus)   . MSSA (methicillin susceptible Staphylococcus aureus) septicemia (HCC)    L 2nd toe-amputated June 2011  . Nephrolithiasis   . Osteoarthritis   . Psoriatic arthritis (HCC)   . Psoriatic arthritis (HCC) 10/14/2011  . Shingles    x2  . Sleep apnea    cpap  . Unspecified essential hypertension     Past Surgical History:  Procedure Laterality Date  . BACK SURGERY     3 fusions, 4th - undo 3rd fusion  . THORACIC SPINE SURGERY    . TOE AMPUTATION    . TOE SURGERY  2009   little toe on right foot , bone removed  . TOTAL HIP ARTHROPLASTY     bilateral     reports that he quit smoking about 25 years ago. His smoking use included Cigarettes. He has never used smokeless tobacco. He reports that he does not drink alcohol or use drugs. Mobility: ambulatory  Allergies  Allergen Reactions  . Amoxicillin-Pot Clavulanate     "liver problems" highly questionable because he was in septic shock  . Sulfur Rash    Family History  Problem Relation Age of Onset  . Coronary artery disease Mother   . Pneumonia Mother   . Heart failure Mother   . Breast cancer Mother   . Psoriasis Father   . Coronary artery disease Brother   . Colon cancer Neg Hx   . Stomach cancer Neg Hx      Prior to Admission medications   Medication Sig Start Date End Date Taking? Authorizing Provider  amLODipine (NORVASC) 5 MG tablet Take 1 tablet (5 mg total) by mouth daily. 12/17/16  Yes Dhungel, Nishant, MD  Ascorbic Acid (VITAMIN C) 500 MG CHEW Chew 500 mg by mouth daily.     Yes [provider]  calcium carbonate (CALCIUM 600) 600 MG TABS Take 600 mg by mouth 3 (three) times daily.     Yes [provider]  carvedilol (COREG) 25 MG tablet TAKE ONE TABLET TWICE DAILY 09/27/12  Yes Wall, Jesse Sans, MD  cyclobenzaprine  (FLEXERIL) 10 MG tablet Take 10 mg by mouth every 8 (eight) hours as needed. For muscle pain.   Yes [provider]  fluticasone furoate-vilanterol (BREO ELLIPTA) 100-25 MCG/INH AEPB Inhale 1 puff into the lungs daily. Inhale 1 puff then rinse mouth, once daily 10/05/15  Yes Young, Clinton D, MD  folic acid (FOLVITE) 1 MG tablet Take 1 mg by mouth daily.     Yes [provider]  gabapentin (NEURONTIN) 300 MG capsule Take 1 capsule by mouth 3 (three) times daily. 12/05/16  Yes [provider]  HYDROmorphone (DILAUDID) 2 MG tablet Take 2 mg by mouth every 4 (four) hours.     Yes [provider]  leflunomide (ARAVA) 10 MG tablet Take 20 mg by mouth daily.  09/21/14  Yes [provider]  levothyroxine (SYNTHROID, LEVOTHROID) 150 MCG tablet Take 175 mcg by mouth daily.    Yes [provider]  losartan (COZAAR) 50 MG tablet Take 1 tablet (50 mg total) by mouth daily. 09/01/12  Yes Wall, Jesse Sans, MD  Multiple Vitamin (MULITIVITAMIN WITH MINERALS) TABS Take 1 tablet by mouth daily.   Yes [provider]  Omega-3 Fatty Acids (FISH OIL) 1000 MG CAPS Take 1 capsule by mouth 3 (three) times daily.    Yes [provider]  oxyCODONE-acetaminophen (PERCOCET) 10-325 MG per tablet Take 1-2 tablets by mouth every 6 (six) hours as needed for pain.    Yes [provider]  predniSONE (DELTASONE) 5 MG tablet Take 5 mg by mouth daily.     Yes [provider]  SYMBICORT 160-4.5 MCG/ACT inhaler USE TWO PUFFS TWICE DAILY 04/04/15  Yes Young, Clinton D, MD  temazepam (RESTORIL) 15 MG capsule TAKE 1 OR 2 CAPSULES AT BEDTIME AS NEEDED FOR SLEEP. 03/26/16  Yes Young, Clinton D, MD  triamcinolone cream (KENALOG) 0.1 % Apply topically.   Yes [provider]  levofloxacin (LEVAQUIN) 750 MG tablet Take 1 tablet (750 mg total) by mouth daily. Patient not taking: Reported on 12/19/2016 12/17/16 12/19/16  Dhungel, Theda Belfast, MD    Physical  Exam:   99.5, 18, 80, 148/75, 95% on 4 L. Appears calm, chronically ill, nontoxic.    eyes. Pupils, irises, lids appear unremarkable.  ENT. Grossly normal hearing, lips, tongue.  Neck. No lymphadenopathy or masses. No thyromegaly.  Respiratory. Clear to auscultation anteriorly bilaterally. Posteriorly, there are some crackles at bilateral bases. No rhonchi or wheezes noted. Normal respiratory effort.  Cardiovascular. Regular rate and rhythm. No murmur, rub or gallop. No lower extremity edema.  Abdomen. Soft, nontender, nondistended. No hepatomegaly.  Musculoskeletal. Grossly normal tone and strength all extremities. Easily lifts both legs off the bed. Left leg notable for BKA.  Skin. No induration noted. Nontender to palpation. Chronic thinning of the skin noted.  Neurologic. Cranial nerves appear grossly intact. No pronator drift. No upper extremity dysdiadochokinesis. Speech is fluent and clear.  Psychiatric. Grossly normal mood and affect.  Wt Readings from Last 3 Encounters:  12/19/16 63 kg (139 lb)  12/13/16 63 kg (139 lb)  09/18/16 63 kg (139 lb)    I have personally reviewed following labs and imaging studies  Labs:   Creatinine significantly elevated above baseline, 1.35. BUN 43 which is significantly increased. Potassium 5.2. Remainder complete metabolic panel unremarkable.  WBC increased, 16.6. Hemoglobin stable 10.2.   Imaging studies:   Chest x-ray independently reviewed, there is near complete clearing of the right and left upper lobe infiltrates. On my review, there appears to be left lower lobe pneumonia, possibly persistent right lower lobe pneumonia. I reviewed these images with the patient's wife at bedside.   Medical tests:    EKG independently reviewed, sinus rhythm, no acute changes.   Review and summation of old records:  As noted in history of present illness   Principal Problem:   Acute respiratory failure with hypoxia (HCC) Active  Problems:   PSORIATIC ARTHRITIS   Immunosuppression (HCC)   Anemia, chronic disease   Acute metabolic encephalopathy   DM type 2 (diabetes mellitus, type 2) (HCC)   AKI (acute kidney injury) (HCC)   Lobar pneumonia (HCC)   Assessment/Plan Acute hypoxic respiratory failure, suspected secondary to left lower lobar pneumonia. Cannot rule out early sepsis at this point although lactic acid has normalized with fluids. SIRS criteria borderline. I suspect initial elevation of lactate was secondary to prerenal azotemia. -Wean oxygen as tolerated. Treat pneumonia.  Left lower lobar pneumonia. -No history of overt aspiration, however the infiltrate does appear to be new, given the patient's clearing of upper lobe infiltrates, I suspect this is a new process. -We will continue vancomycin, add Flagyl to cover anaerobes. Wife tells me that the patient was instructed to never take Augmentin again by his GI physician. Given chronic immunosuppression with prednisone, patient is a poor candidate for clindamycin. -SLP evaluation.  Acute encephalopathy. Secondary to infection. Appears improved. Monitor clinically.  Acute kidney injury with modest hyperkalemia. Secondary to poor oral intake, acute illness. -IV fluids. Check BMP in a.m.  Psoriatic arthritis with chronic immunosuppression secondary to chronic prednisone -Continue prednisone.  Diabetes mellitus type 2, diet-controlled -SSI  Anemia of chronic disease. Stable.   Severity of Illness: The appropriate patient status for this patient is INPATIENT. Inpatient status is judged to be reasonable and necessary in order to provide the required intensity of service to ensure the patient's safety. The patient's presenting symptoms, physical exam findings, and initial radiographic and laboratory data in the context of their chronic comorbidities is felt to place them at high risk for further clinical deterioration. Furthermore, it is not anticipated that  the patient will be medically stable for discharge from the hospital within 2 midnights of admission. The following factors support the patient status of inpatient.   * I certify that at the point of admission it is my clinical judgment that the patient will require inpatient hospital care spanning beyond 2 midnights from the point of admission due to high intensity of service, high risk for further deterioration and high frequency of surveillance required.*     DVT prophylaxis:enoxaparin Code Status: full Family Communication: wife at bedide Consults called: none    Time spent: 60 minutes  Brendia Sacks, MD  [facility] Direct contact: [phone] --Via amion app OR  --[email or web address]; password [email or web address]  7PM-7AM contact night coverage as above  12/19/2016, 11:58 AM

## 2016-12-19 NOTE — Progress Notes (Signed)
A consult was received from an ED physician for Vancomycin, Aztreonam per pharmacy dosing.  Dr. Preston Fleeting agreed to change aztreonam to cefepime after evaluating the PCN allergy information.  The patient's profile has been reviewed for ht/wt/allergies/indication/available labs.   A one time order has been placed for Vancomycin 1gm iv x1, and Cefepime 2gm iv x1.  Further antibiotics/pharmacy consults should be ordered by admitting physician if indicated.                       Thank you,  Luetta Nutting PharmD, BCPS  12/19/2016, 5:30 AM

## 2016-12-20 ENCOUNTER — Inpatient Hospital Stay (HOSPITAL_COMMUNITY): Payer: Medicare Other

## 2016-12-20 DIAGNOSIS — J9601 Acute respiratory failure with hypoxia: Secondary | ICD-10-CM

## 2016-12-20 LAB — CBC
HEMATOCRIT: 28.1 % — AB (ref 39.0–52.0)
HEMOGLOBIN: 9.4 g/dL — AB (ref 13.0–17.0)
MCH: 29.6 pg (ref 26.0–34.0)
MCHC: 33.5 g/dL (ref 30.0–36.0)
MCV: 88.4 fL (ref 78.0–100.0)
PLATELETS: 301 10*3/uL (ref 150–400)
RBC: 3.18 MIL/uL — AB (ref 4.22–5.81)
RDW: 15.6 % — ABNORMAL HIGH (ref 11.5–15.5)
WBC: 14.1 10*3/uL — ABNORMAL HIGH (ref 4.0–10.5)

## 2016-12-20 LAB — BASIC METABOLIC PANEL
ANION GAP: 7 (ref 5–15)
BUN: 17 mg/dL (ref 6–20)
CO2: 26 mmol/L (ref 22–32)
Calcium: 8.3 mg/dL — ABNORMAL LOW (ref 8.9–10.3)
Chloride: 103 mmol/L (ref 101–111)
Creatinine, Ser: 0.64 mg/dL (ref 0.61–1.24)
GFR calc non Af Amer: >60 mL/min (ref >60)
GLUCOSE: 190 mg/dL — AB (ref 65–99)
POTASSIUM: 4.3 mmol/L (ref 3.5–5.1)
Sodium: 136 mmol/L (ref 135–145)

## 2016-12-20 LAB — GLUCOSE, CAPILLARY
GLUCOSE-CAPILLARY: 146 mg/dL — AB (ref 65–99)
GLUCOSE-CAPILLARY: 308 mg/dL — AB (ref 65–99)
GLUCOSE-CAPILLARY: 324 mg/dL — AB (ref 65–99)
Glucose-Capillary: 221 mg/dL — ABNORMAL HIGH (ref 65–99)
Glucose-Capillary: 201 mg/dL — ABNORMAL HIGH (ref 65–99)

## 2016-12-20 LAB — HEMOGLOBIN A1C
Hgb A1c MFr Bld: 8.7 % — ABNORMAL HIGH (ref 4.8–5.6)
MEAN PLASMA GLUCOSE: 202.99 mg/dL

## 2016-12-20 LAB — STREP PNEUMONIAE URINARY ANTIGEN: Strep Pneumo Urinary Antigen: NEGATIVE

## 2016-12-20 MED ORDER — HYDRALAZINE HCL 20 MG/ML IJ SOLN
10.0000 mg | Freq: Four times a day (QID) | INTRAMUSCULAR | Status: DC | PRN
  Administered 2016-12-20 – 2016-12-22 (×3): 10 mg via INTRAVENOUS
  Filled 2016-12-20 (×3): qty 1

## 2016-12-20 MED ORDER — DEXTROSE 5 % IV SOLN
1.0000 g | INTRAVENOUS | Status: DC
  Administered 2016-12-20 – 2016-12-22 (×3): 1 g via INTRAVENOUS
  Filled 2016-12-20 (×4): qty 10

## 2016-12-20 MED ORDER — INSULIN ASPART 100 UNIT/ML ~~LOC~~ SOLN
0.0000 [IU] | Freq: Three times a day (TID) | SUBCUTANEOUS | Status: DC
  Administered 2016-12-20: 5 [IU] via SUBCUTANEOUS
  Administered 2016-12-21 (×2): 3 [IU] via SUBCUTANEOUS
  Administered 2016-12-21: 8 [IU] via SUBCUTANEOUS
  Administered 2016-12-22: 5 [IU] via SUBCUTANEOUS
  Administered 2016-12-22: 2 [IU] via SUBCUTANEOUS
  Administered 2016-12-22: 5 [IU] via SUBCUTANEOUS
  Administered 2016-12-23: 8 [IU] via SUBCUTANEOUS
  Administered 2016-12-23: 2 [IU] via SUBCUTANEOUS

## 2016-12-20 NOTE — Progress Notes (Signed)
Modified Barium Swallow Progress Note  Patient Details  Name: Johnny Patrick MRN: [id number] Date of Birth: [birth year]  Today's Date: 12/20/2016  Modified Barium Swallow completed.  Full report located under Chart Review in the Imaging Section.  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  Pt presents also with base of tongue weakness and decreased laryngeal elevation, which lead to bolus retention in the vallecular and pyriform spaces.  These issues lead to intermittent, trace aspiration of thin liquids and deep penetration of nectar-thick liquids to the vocal folds.  Aspiration is accompanied by a consistent cough.  Postural adjustments (head turns, tilts, and chin tuck) were applied, but none were reliably effective in preventing aspiration.  With controlled/small boluses, and frequent throat-clearing/double swallows, pt was better able to protect airway, although not consistently.  For now, recommend continuing current diet with strict aspiration precautions.  Pt will benefit from OP SLP (he lives close to [facility], and agress that location would be convenient) to address muscular component of his dysphagia.  D/W Dr. Sunnie Nielsen.      Swallow Evaluation Recommendations       SLP Diet Recommendations: Regular solids;Thin liquid   Liquid Administration via: Straw;Cup   Medication Administration: Crushed with puree   Supervision: Patient able to self feed   Compensations: Slow rate;Small sips/bites;Clear throat intermittently;Multiple dry swallows after each bite/sip   Postural Changes: Remain semi-upright after after feeds/meals (Comment)   Oral Care Recommendations: Oral care BID       [name] L. [name], [place] CCC/SLP Pager  [phone]; [name] [name] 12/20/2016,1:50 PM

## 2016-12-20 NOTE — Evaluation (Signed)
Clinical/Bedside Swallow Evaluation Patient Details  Name: Johnny Patrick MRN: [id number] Date of Birth: [birth year]  Today's Date: 12/20/2016 Time: SLP Start Time (ACUTE ONLY): 1013 SLP Stop Time (ACUTE ONLY): 1040 SLP Time Calculation (min) (ACUTE ONLY): 27 min  Past Medical History:  Past Medical History:  Diagnosis Date  . Anemia   . Anemia, chronic disease 10/14/2011  . 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 Chronic airway obstruction, not elsewhere classified   . Cryptococcus (HCC) 2008   left arm  . Diabetes (HCC)   . Diabetic foot ulcer (HCC) 09/18/2016  . Esophageal reflux   . Hypothyroidism   . Low back pain   . MRSA (methicillin resistant Staphylococcus aureus)   . MSSA (methicillin susceptible Staphylococcus aureus) septicemia (HCC)    L 2nd toe-amputated June 2011  . Nephrolithiasis   . Osteoarthritis   . Psoriatic arthritis (HCC)   . Psoriatic arthritis (HCC) 10/14/2011  . Shingles    x2  . Sleep apnea    cpap  . Unspecified essential hypertension    Past Surgical History:  Past Surgical History:  Procedure Laterality Date  . BACK SURGERY     3 fusions, 4th - undo 3rd fusion  . THORACIC SPINE SURGERY    . TOE AMPUTATION    . TOE SURGERY  2009   little toe on right foot , bone removed  . TOTAL HIP ARTHROPLASTY     bilateral   HPI:  71 year old man PMH psoriatic arthritis on chronic prednisone, recently hospitalized for acute hypoxic respiratory failure secondary to multilobar pneumonia, sepsis, presented to the emergency department with generalized weakness, poor oral intake and confusion. He was placed on nonrebreather mask. Admitted for acute hypoxic respiratory failure, acute kidney injury, suspected new left lobar pneumonia, concerning for aspiration.  Pt with remote hx of dysphagia 2009.  He describes s/s of esophageal reflux, reporting "fluid coming up" at night when lying down.     Assessment / Plan / Recommendation Clinical Impression  Pt presents  with adequate mastication, brisk swallow, no difficulty with consumption of purees, solids, but failed three oz water test.  Pt coughed consistently after large, consecutive boluses of water.  Given concerns for aspiration, current dx, and remote hx of dysphagia, recommend proceeding with MBS today.  Pt, RN agree. Scheduled for noon today.  SLP Visit Diagnosis: Dysphagia, unspecified (R13.10)    Aspiration Risk    TBA   Diet Recommendation   pending MBS at noon today       Other  Recommendations     Follow up Recommendations        Frequency and Duration            Prognosis        Swallow Study   General Date of Onset: 12/19/16 HPI: 71 year old man PMH psoriatic arthritis on chronic prednisone, recently hospitalized for acute hypoxic respiratory failure secondary to multilobar pneumonia, sepsis, presented to the emergency department with generalized weakness, poor oral intake and confusion. He was placed on nonrebreather mask. Admitted for acute hypoxic respiratory failure, acute kidney injury, suspected new left lobar pneumonia, concerning for aspiration.  Pt with remote hx of dysphagia 2009.  He describes s/s of esophageal reflux, reporting "fluid coming up" at night when lying down.   Type of Study: Bedside Swallow Evaluation Previous Swallow Assessment: multiple MBS studies July, August of 2009 - results not accessible Diet Prior to this Study: Regular Temperature Spikes Noted: No Respiratory Status: Nasal cannula History of Recent  Intubation: No Behavior/Cognition: Alert;Cooperative Oral Cavity Assessment: Within Functional Limits Oral Care Completed by SLP: No Oral Cavity - Dentition: Missing dentition Vision: Functional for self-feeding Self-Feeding Abilities: Able to feed self Patient Positioning: Upright in bed Baseline Vocal Quality: Normal Volitional Cough: Strong Volitional Swallow: Able to elicit    Oral/Motor/Sensory Function Overall Oral Motor/Sensory  Function: Within functional limits   Ice Chips Ice chips: Within functional limits   Thin Liquid Thin Liquid: Impaired Presentation: Cup;Self Fed Pharyngeal  Phase Impairments: Cough - Delayed    Nectar Thick Nectar Thick Liquid: Not tested   Honey Thick Honey Thick Liquid: Not tested   Puree Puree: Within functional limits   Solid   GO   Solid: Within functional limits        Johnny Patrick Johnny Patrick 12/20/2016,10:47 AM Johnny Patrick L. Johnny Patrick, [place] CCC/SLP Pager [phone]

## 2016-12-20 NOTE — Progress Notes (Signed)
Inpatient Diabetes Program Recommendations  AACE/ADA: New Consensus Statement on Inpatient Glycemic Control (2015)  Target Ranges:  Prepandial:   less than 140 mg/dL      Peak postprandial:   less than 180 mg/dL (1-2 hours)      Critically ill patients:  140 - 180 mg/dL   Lab Results  Component Value Date   GLUCAP 308 (H) 12/20/2016   HGBA1C 6.3 (H) 01/02/2011    Review of Glycemic Control  Diabetes history: DM2 Outpatient Diabetes medications: None Current orders for Inpatient glycemic control: Novolog sensitive correction  Inpatient Diabetes Program Recommendations:  While on steroids, Please consider increase in Novolog correction to moderate scale. Noted pending A1c.  Thank you, Johnny Patrick. Alyona Romack, RN, MSN, CDE  Diabetes Coordinator Inpatient Glycemic Control Team Team Pager [phone] (8am-5pm) 12/20/2016 1:02 PM

## 2016-12-20 NOTE — Progress Notes (Signed)
PROGRESS NOTE    Johnny Patrick  ZOX:[id number] DOB: 05/05/1945 DOA: 12/19/2016 PCP: Georgianne Fick, MD    Brief Narrative: 71 year old man PMH psoriatic arthritis on chronic prednisone, recently hospitalized for acute hypoxic respiratory failure secondary to multilobar pneumonia, sepsis, presented to the emergency department with generalized weakness, poor oral intake and confusion. He was placed on nonrebreather mask. Admitted for acute hypoxic respiratory failure, acute kidney injury, suspected new left lobar pneumonia, concerning for aspiration.  Patient denies any complaints at the present moment. No pain, no shortness of breath. He reports feeling "weak" since discharge from the hospital 9/10. He did complete antibiotics. He has had a cough but no shortness of breath. No focal weakness noted. He reports appetite and fluid intake has been baseline. Wife at bedside disputes this. She reports since discharge she is being gradually becoming more weak in general. No significant focal deficits noted. No dysarthria or difficulty swallowing reported. No aspiration noted. She has noted a cough and episodes of confusion that began early this morning. Because of generalized weakness, confusion and cough she brought him to the emergency department for further evaluation. No specific aggravating or alleviating factors.  ED Course: Afebrile, SBP initially 92/60, now 148/75. Initially on nonrebreather 100%. Now on nasal cannula, 100%. Treated with vancomycin, cefepime.   Assessment & Plan:   Principal Problem:   Acute respiratory failure with hypoxia (HCC) Active Problems:   PSORIATIC ARTHRITIS   Immunosuppression (HCC)   Anemia, chronic disease   Acute metabolic encephalopathy   DM type 2 (diabetes mellitus, type 2) (HCC)   AKI (acute kidney injury) (HCC)   Lobar pneumonia (HCC)   1-Acute hypoxic Respiratory Failure;  Secondary to PNA.  Stable on 2 L of Oxygen.  Treating PNA.   2-PNA  left lower lobar PNA>  Speech evaluation.  Continue with vancomycin and flagyl.  WBC trending down.   3-Acute encephalopathy; secondary to infection , improved.   4-AKI, hyperkalemia; improved with fluids.   5-HTN; continue with Norvasc, coreg, cozaar.  PRN Hydralazine.   6-Psoriatic arthritis with chronic immunosuppression secondary to chronic prednisone -Continue prednisone.  7-Diabetes mellitus type 2, with hyperglycemia.  -SSI -check HbA1.   8-Anemia of chronic disease. Stable.    DVT prophylaxis: Lovenox Code Status: Full code.  Family Communication: care discussed with patient.  Disposition Plan: home when stable. .   Consultants:   none   Procedures: none   Antimicrobials:   Vancomycin, flagyl.    Subjective: Patient feeling better, no significant cough. Denies diarrhea.   Objective: Vitals:   12/20/16 0400 12/20/16 0419 12/20/16 0421 12/20/16 0422  BP:  (!) 170/66    Pulse: 69  68 67  Resp: 18  16 17   Temp:      TempSrc:      SpO2: 97%  96% 97%  Weight:      Height:        Intake/Output Summary (Last 24 hours) at 12/20/16 0719 Last data filed at 12/20/16 0423  Gross per 24 hour  Intake          2238.75 ml  Output             1700 ml  Net           538.75 ml   Filed Weights   12/19/16 0434  Weight: 63 kg (139 lb)    Examination:  General exam: Appears calm and comfortable  Respiratory system: Clear to auscultation. Respiratory effort normal. Cardiovascular system: S1 & S2  heard, RRR. No JVD, murmurs, rubs, gallops or clicks. No pedal edema. Gastrointestinal system: Abdomen is nondistended, soft and nontender. No organomegaly or masses felt. Normal bowel sounds heard. Central nervous system: Alert and oriented. No focal neurological deficits. Extremities: Symmetric 5 x 5 power. Left LE with BKA Skin: No rashes, lesions or ulcers   Data Reviewed: I have personally reviewed following labs and imaging studies  CBC:  Recent  Labs Lab 12/14/16 0411 12/19/16 0447 12/20/16 0328  WBC 13.5* 16.6* 14.1*  NEUTROABS  --  15.1*  --   HGB 10.0* 10.2* 9.4*  HCT 30.9* 31.4* 28.1*  MCV 88.3 87.5 88.4  PLT 231 318 301   Basic Metabolic Panel:  Recent Labs Lab 12/14/16 0411 12/19/16 0447 12/20/16 0328  NA 138 136 136  K 3.4* 5.2* 4.3  CL 102 101 103  CO2 GLUCOSE 122* 308* 190*  BUN 8 43* 17  CREATININE 0.47* 1.35* 0.64  CALCIUM 8.6* 9.1 8.3*   GFR: Estimated Creatinine Clearance: 73.4 mL/min (by C-G formula based on SCr of 0.64 mg/dL). Liver Function Tests:  Recent Labs Lab 12/19/16 0447  AST 31  ALT 25  ALKPHOS 107  BILITOT 0.5  PROT 6.2*  ALBUMIN 2.8*   No results for input(s): LIPASE, AMYLASE in the last 168 hours. No results for input(s): AMMONIA in the last 168 hours. Coagulation Profile:  Recent Labs Lab 12/19/16 0447  INR 1.01   Cardiac Enzymes:  Recent Labs Lab 12/19/16 0504  TROPONINI <0.03   BNP (last 3 results) No results for input(s): PROBNP in the last 8760 hours. HbA1C: No results for input(s): HGBA1C in the last 72 hours. CBG:  Recent Labs Lab 12/13/16 2309 12/14/16 0731 12/14/16 1506 12/19/16 1729 12/19/16 2136  GLUCAP 179* 188* 304* 209* 155*   Lipid Profile: No results for input(s): CHOL, HDL, LDLCALC, TRIG, CHOLHDL, LDLDIRECT in the last 72 hours. Thyroid Function Tests: No results for input(s): TSH, T4TOTAL, FREET4, T3FREE, THYROIDAB in the last 72 hours. Anemia Panel: No results for input(s): VITAMINB12, FOLATE, FERRITIN, TIBC, IRON, RETICCTPCT in the last 72 hours. Sepsis Labs:  Recent Labs Lab 12/13/16 0816 12/19/16 0508 12/19/16 0906  LATICACIDVEN 1.03 2.46* 1.22    Recent Results (from the past 240 hour(s))  Culture, blood (Routine x 2)     Status: None   Collection Time: 12/13/16  5:05 AM  Result Value Ref Range Status   Specimen Description BLOOD RIGHT FOREARM  Final   Special Requests   Final    BOTTLES DRAWN AEROBIC AND  ANAEROBIC Blood Culture adequate volume   Culture   Final    NO GROWTH 5 DAYS Performed at [facility], [address] [place], [place] [address]    Report Status 12/18/2016 FINAL  Final  Culture, blood (Routine x 2)     Status: None   Collection Time: 12/13/16  5:05 AM  Result Value Ref Range Status   Specimen Description BLOOD LEFT FOREARM  Final   Special Requests   Final    BOTTLES DRAWN AEROBIC AND ANAEROBIC Blood Culture adequate volume   Culture   Final    NO GROWTH 5 DAYS Performed at [facility], [address], [place], [place] [address]    Report Status 12/18/2016 FINAL  Final  MRSA PCR Screening     Status: None   Collection Time: 12/13/16  8:51 AM  Result Value Ref Range Status   MRSA by PCR NEGATIVE NEGATIVE  Final    Comment:        The GeneXpert MRSA Assay (FDA approved for NASAL specimens only), is one component of a comprehensive MRSA colonization surveillance program. It is not intended to diagnose MRSA infection nor to guide or monitor treatment for MRSA infections.   C difficile quick scan w PCR reflex     Status: None   Collection Time: 12/14/16  2:41 PM  Result Value Ref Range Status   C Diff antigen NEGATIVE NEGATIVE Final   C Diff toxin NEGATIVE NEGATIVE Final   C Diff interpretation No C. difficile detected.  Final  MRSA PCR Screening     Status: None   Collection Time: 12/19/16  4:24 PM  Result Value Ref Range Status   MRSA by PCR NEGATIVE NEGATIVE Final    Comment:        The GeneXpert MRSA Assay (FDA approved for NASAL specimens only), is one component of a comprehensive MRSA colonization surveillance program. It is not intended to diagnose MRSA infection nor to guide or monitor treatment for MRSA infections.   Culture, sputum-assessment     Status: None   Collection Time: 12/19/16  9:09 PM  Result Value Ref Range Status   Specimen Description EXPECTORATED SPUTUM  Final   Special Requests NONE  Final   Sputum  evaluation THIS SPECIMEN IS ACCEPTABLE FOR SPUTUM CULTURE  Final   Report Status 12/19/2016 FINAL  Final  Culture, respiratory (NON-Expectorated)     Status: None (Preliminary result)   Collection Time: 12/19/16  9:09 PM  Result Value Ref Range Status   Specimen Description EXPECTORATED SPUTUM  Final   Special Requests NONE Reflexed from [id number]  Final   Gram Stain   Final    RARE WBC PRESENT, PREDOMINANTLY PMN RARE GRAM POSITIVE RODS Performed at [facility], [address], [place], [place] [address]    Culture PENDING  Incomplete   Report Status PENDING  Incomplete         Radiology Studies: Dg Chest Port 1 View  Result Date: 12/19/2016 CLINICAL DATA:  71 y/o M; increased weakness and shortness of breath. Recent discharge for pneumonia. EXAM: PORTABLE CHEST 1 VIEW COMPARISON:  12/13/2016 chest radiograph. FINDINGS: Near complete resolution of consolidation is in the lungs bilaterally with minimal streaky residual in the left upper lung zone and bilateral lower lung zones. Stable cardiac silhouette given projection and technique. Aortic atherosclerosis with calcification. Partially visualized lumbar fusion hardware. No acute osseous abnormality identified. IMPRESSION: Near complete resolution of pneumonia with minimal residual in the left upper lung zone and bilateral lung bases. Electronically Signed   By: Mitzi Hansen M.D.   On: 12/19/2016 05:33        Scheduled Meds: . amLODipine  5 mg Oral Daily  . carvedilol  25 mg Oral BID  . enoxaparin (LOVENOX) injection  40 mg Subcutaneous Q24H  . fluticasone furoate-vilanterol  1 puff Inhalation Daily  . gabapentin  300 mg Oral TID  . insulin aspart  0-9 Units Subcutaneous TID WC  . leflunomide  20 mg Oral Daily  . levothyroxine  175 mcg Oral QAC breakfast  . losartan  50 mg Oral Daily  . predniSONE  5 mg Oral Daily  . sodium chloride flush  3 mL Intravenous Q12H  . temazepam  15 mg Oral QHS   Continuous  Infusions: . sodium chloride 75 mL/hr at 12/20/16 0525  . metronidazole 500 mg (12/20/16 0631)  . vancomycin Stopped (12/20/16 1655)  LOS: 1 day    Time spent: 35 minutes.     Alba Cory, MD [facility] Pager [phone]  If 7PM-7AM, please contact night-coverage [email or web address] Password [facility] 12/20/2016, 7:19 AM

## 2016-12-21 LAB — BASIC METABOLIC PANEL
ANION GAP: 7 (ref 5–15)
BUN: 14 mg/dL (ref 6–20)
CHLORIDE: 102 mmol/L (ref 101–111)
CO2: 28 mmol/L (ref 22–32)
CREATININE: 0.57 mg/dL — AB (ref 0.61–1.24)
Calcium: 8.7 mg/dL — ABNORMAL LOW (ref 8.9–10.3)
GFR calc non Af Amer: >60 mL/min (ref >60)
Glucose, Bld: 162 mg/dL — ABNORMAL HIGH (ref 65–99)
POTASSIUM: 3.6 mmol/L (ref 3.5–5.1)
SODIUM: 137 mmol/L (ref 135–145)

## 2016-12-21 LAB — CBC
HEMATOCRIT: 28.9 % — AB (ref 39.0–52.0)
HEMOGLOBIN: 9.5 g/dL — AB (ref 13.0–17.0)
MCH: 28.4 pg (ref 26.0–34.0)
MCHC: 32.9 g/dL (ref 30.0–36.0)
MCV: 86.5 fL (ref 78.0–100.0)
Platelets: 366 10*3/uL (ref 150–400)
RBC: 3.34 MIL/uL — AB (ref 4.22–5.81)
RDW: 15.4 % (ref 11.5–15.5)
WBC: 12.2 10*3/uL — AB (ref 4.0–10.5)

## 2016-12-21 LAB — GLUCOSE, CAPILLARY
GLUCOSE-CAPILLARY: 175 mg/dL — AB (ref 65–99)
GLUCOSE-CAPILLARY: 283 mg/dL — AB (ref 65–99)
GLUCOSE-CAPILLARY: 163 mg/dL — AB (ref 65–99)
Glucose-Capillary: 183 mg/dL — ABNORMAL HIGH (ref 65–99)

## 2016-12-21 MED ORDER — ONDANSETRON HCL 4 MG/2ML IJ SOLN
4.0000 mg | Freq: Four times a day (QID) | INTRAMUSCULAR | Status: DC | PRN
  Administered 2016-12-21 – 2016-12-23 (×4): 4 mg via INTRAVENOUS
  Filled 2016-12-21 (×4): qty 2

## 2016-12-21 NOTE — Progress Notes (Signed)
PROGRESS NOTE    Johnny Patrick  OAC:[id number] DOB: December 30, 1945 DOA: 12/19/2016 PCP: Georgianne Fick, MD    Brief Narrative: 71 year old man PMH psoriatic arthritis on chronic prednisone, recently hospitalized for acute hypoxic respiratory failure secondary to multilobar pneumonia, sepsis, presented to the emergency department with generalized weakness, poor oral intake and confusion. He was placed on nonrebreather mask. Admitted for acute hypoxic respiratory failure, acute kidney injury, suspected new left lobar pneumonia, concerning for aspiration.  Patient denies any complaints at the present moment. No pain, no shortness of breath. He reports feeling "weak" since discharge from the hospital 9/10. He did complete antibiotics. He has had a cough but no shortness of breath. No focal weakness noted. He reports appetite and fluid intake has been baseline. Wife at bedside disputes this. She reports since discharge she is being gradually becoming more weak in general. No significant focal deficits noted. No dysarthria or difficulty swallowing reported. No aspiration noted. She has noted a cough and episodes of confusion that began early this morning. Because of generalized weakness, confusion and cough she brought him to the emergency department for further evaluation. No specific aggravating or alleviating factors.  ED Course: Afebrile, SBP initially 92/60, now 148/75. Initially on nonrebreather 100%. Now on nasal cannula, 100%. Treated with vancomycin, cefepime.   Assessment & Plan:   Principal Problem:   Acute respiratory failure with hypoxia (HCC) Active Problems:   PSORIATIC ARTHRITIS   Immunosuppression (HCC)   Anemia, chronic disease   Acute metabolic encephalopathy   DM type 2 (diabetes mellitus, type 2) (HCC)   AKI (acute kidney injury) (HCC)   Lobar pneumonia (HCC)   1-Acute hypoxic Respiratory Failure;  Secondary to PNA.  Stable on 2 L of Oxygen.  Treating PNA.   2-PNA  left lower lobar PNA> Aspiration PNA Speech evaluation. Patient with mild to moderate pharyngeal dysphagia.  Continue with ceftriaxone  and flagyl.  WBC trending down.  Aspiration precaution.  Culture sputum; pending.   3-Acute encephalopathy; secondary to infection , improved.   4-AKI, hyperkalemia; improved with fluids.   5-HTN; continue with Norvasc, coreg, cozaar.  PRN Hydralazine.   6-Psoriatic arthritis with chronic immunosuppression secondary to chronic prednisone -Continue prednisone.  7-Diabetes mellitus type 2, with hyperglycemia.  -SSI -HbA1. 8.7 Will start metformin at discharge   8-Anemia of chronic disease. Stable.    DVT prophylaxis: Lovenox Code Status: Full code.  Family Communication: care discussed with patient.  Disposition Plan: home when stable. .   Consultants:   none   Procedures: none   Antimicrobials:   Vancomycin, flagyl.    Subjective: Dyspnea improved. Cough better   Objective: Vitals:   12/20/16 1314 12/20/16 2036 12/21/16 0423 12/21/16 0754  BP: (!) 148/71 (!) 142/77 (!) 171/85   Pulse: 74 76 75   Resp: 18 18 18    Temp: 98.3 F (36.8 C) 99.1 F (37.3 C) 98.5 F (36.9 C)   TempSrc: Oral Oral Oral   SpO2: 100% 94% 93% 91%  Weight:      Height:        Intake/Output Summary (Last 24 hours) at 12/21/16 0907 Last data filed at 12/21/16 0600  Gross per 24 hour  Intake             1030 ml  Output             1300 ml  Net             -270 ml   12/23/16  12/19/16 0434  Weight: 63 kg (139 lb)    Examination:  General exam:NAD Respiratory system: CTA Cardiovascular system: S 1, S 2 RRR Gastrointestinal system: Abdomen is soft, nt Central nervous system: Alert and oriented. No focal neurological deficits. Extremities:  Left LE with BKA Skin: No rashes, lesions or ulcers   Data Reviewed: I have personally reviewed following labs and imaging studies  CBC:  Recent Labs Lab 12/19/16 0447  12/20/16 0328 12/21/16 0446  WBC 16.6* 14.1* 12.2*  NEUTROABS 15.1*  --   --   HGB 10.2* 9.4* 9.5*  HCT 31.4* 28.1* 28.9*  MCV 87.5 88.4 86.5  PLT 318 301 366   Basic Metabolic Panel:  Recent Labs Lab 12/19/16 0447 12/20/16 0328 12/21/16 0446  NA 136 136 137  K 5.2* 4.3 3.6  CL 101 103 102  CO2 25 26 28   GLUCOSE 308* 190* 162*  BUN 43* 17 14  CREATININE 1.35* 0.64 0.57*  CALCIUM 9.1 8.3* 8.7*   GFR: Estimated Creatinine Clearance: 73.4 mL/min (A) (by C-G formula based on SCr of 0.57 mg/dL (L)). Liver Function Tests:  Recent Labs Lab 12/19/16 0447  AST 31  ALT 25  ALKPHOS 107  BILITOT 0.5  PROT 6.2*  ALBUMIN 2.8*   No results for input(s): LIPASE, AMYLASE in the last 168 hours. No results for input(s): AMMONIA in the last 168 hours. Coagulation Profile:  Recent Labs Lab 12/19/16 0447  INR 1.01   Cardiac Enzymes:  Recent Labs Lab 12/19/16 0504  TROPONINI <0.03   BNP (last 3 results) No results for input(s): PROBNP in the last 8760 hours. HbA1C:  Recent Labs  12/20/16 1434  HGBA1C 8.7*   CBG:  Recent Labs Lab 12/20/16 1159 12/20/16 1320 12/20/16 1646 12/20/16 2033 12/21/16 0810  GLUCAP 308* 324* 221* 201* 175*   Lipid Profile: No results for input(s): CHOL, HDL, LDLCALC, TRIG, CHOLHDL, LDLDIRECT in the last 72 hours. Thyroid Function Tests: No results for input(s): TSH, T4TOTAL, FREET4, T3FREE, THYROIDAB in the last 72 hours. Anemia Panel: No results for input(s): VITAMINB12, FOLATE, FERRITIN, TIBC, IRON, RETICCTPCT in the last 72 hours. Sepsis Labs:  Recent Labs Lab 12/19/16 0508 12/19/16 0906  LATICACIDVEN 2.46* 1.22    Recent Results (from the past 240 hour(s))  Culture, blood (Routine x 2)     Status: None   Collection Time: 12/13/16  5:05 AM  Result Value Ref Range Status   Specimen Description BLOOD RIGHT FOREARM  Final   Special Requests   Final    BOTTLES DRAWN AEROBIC AND ANAEROBIC Blood Culture adequate volume    Culture   Final    NO GROWTH 5 DAYS Performed at [facility], [address], [place], [place]    Report Status 12/18/2016 FINAL  Final  Culture, blood (Routine x 2)     Status: None   Collection Time: 12/13/16  5:05 AM  Result Value Ref Range Status   Specimen Description BLOOD LEFT FOREARM  Final   Special Requests   Final    BOTTLES DRAWN AEROBIC AND ANAEROBIC Blood Culture adequate volume   Culture   Final    NO GROWTH 5 DAYS Performed at [facility], [address] [place], [place]    Report Status 12/18/2016 FINAL  Final  MRSA PCR Screening     Status: None   Collection Time: 12/13/16  8:51 AM  Result Value Ref Range Status   MRSA by PCR NEGATIVE NEGATIVE Final  Comment:        The GeneXpert MRSA Assay (FDA approved for NASAL specimens only), is one component of a comprehensive MRSA colonization surveillance program. It is not intended to diagnose MRSA infection nor to guide or monitor treatment for MRSA infections.   C difficile quick scan w PCR reflex     Status: None   Collection Time: 12/14/16  2:41 PM  Result Value Ref Range Status   C Diff antigen NEGATIVE NEGATIVE Final   C Diff toxin NEGATIVE NEGATIVE Final   C Diff interpretation No C. difficile detected.  Final  Culture, blood (Routine x 2)     Status: None (Preliminary result)   Collection Time: 12/19/16  4:47 AM  Result Value Ref Range Status   Specimen Description BLOOD RIGHT FOREARM  Final   Special Requests   Final    BOTTLES DRAWN AEROBIC AND ANAEROBIC Blood Culture results may not be optimal due to an inadequate volume of blood received in culture bottles   Culture   Final    NO GROWTH 1 DAY Performed at [facility], [address] [place], [place] [address]    Report Status PENDING  Incomplete  Culture, blood (Routine x 2)     Status: None (Preliminary result)   Collection Time: 12/19/16  4:47 AM  Result Value Ref Range Status   Specimen  Description BLOOD LEFT FOREARM  Final   Special Requests   Final    BOTTLES DRAWN AEROBIC AND ANAEROBIC Blood Culture results may not be optimal due to an inadequate volume of blood received in culture bottles   Culture   Final    NO GROWTH 1 DAY Performed at [facility], [address], [place], [place] [address]    Report Status PENDING  Incomplete  MRSA PCR Screening     Status: None   Collection Time: 12/19/16  4:24 PM  Result Value Ref Range Status   MRSA by PCR NEGATIVE NEGATIVE Final    Comment:        The GeneXpert MRSA Assay (FDA approved for NASAL specimens only), is one component of a comprehensive MRSA colonization surveillance program. It is not intended to diagnose MRSA infection nor to guide or monitor treatment for MRSA infections.   Culture, sputum-assessment     Status: None   Collection Time: 12/19/16  9:09 PM  Result Value Ref Range Status   Specimen Description EXPECTORATED SPUTUM  Final   Special Requests NONE  Final   Sputum evaluation THIS SPECIMEN IS ACCEPTABLE FOR SPUTUM CULTURE  Final   Report Status 12/19/2016 FINAL  Final  Culture, respiratory (NON-Expectorated)     Status: None (Preliminary result)   Collection Time: 12/19/16  9:09 PM  Result Value Ref Range Status   Specimen Description EXPECTORATED SPUTUM  Final   Special Requests NONE Reflexed from [id number]  Final   Gram Stain   Final    RARE WBC PRESENT, PREDOMINANTLY PMN RARE GRAM POSITIVE RODS Performed at [facility], [address], [place], [place] [address]    Culture PENDING  Incomplete   Report Status PENDING  Incomplete         Radiology Studies: Dg Swallowing Func-speech Pathology  Result Date: 12/20/2016 Objective Swallowing Evaluation: Type of Study: MBS-Modified Barium Swallow Study Patient Details Name: Johnny Patrick MRN: [id number] Date of Birth: 07-06-1945 Today's Date: 12/20/2016 Time: SLP Start Time (ACUTE ONLY): 1230-SLP Stop Time (ACUTE ONLY): 1300 SLP  Time Calculation (min) (  ACUTE ONLY): 30 min Past Medical History: Past Medical History: Diagnosis Date . Anemia  . Anemia, chronic disease 10/14/2011 . 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 Chronic airway obstruction, not elsewhere classified  . Cryptococcus (HCC) 2008  left arm . Diabetes (HCC)  . Diabetic foot ulcer (HCC) 09/18/2016 . Esophageal reflux  . Hypothyroidism  . Low back pain  . MRSA (methicillin resistant Staphylococcus aureus)  . MSSA (methicillin susceptible Staphylococcus aureus) septicemia (HCC)   L 2nd toe-amputated June 2011 . Nephrolithiasis  . Osteoarthritis  . Psoriatic arthritis (HCC)  . Psoriatic arthritis (HCC) 10/14/2011 . Shingles   x2 . Sleep apnea   cpap . Unspecified essential hypertension  Past Surgical History: Past Surgical History: Procedure Laterality Date . BACK SURGERY    3 fusions, 4th - undo 3rd fusion . THORACIC SPINE SURGERY   . TOE AMPUTATION   . TOE SURGERY  2009  little toe on right foot , bone removed . TOTAL HIP ARTHROPLASTY    bilateral HPI: 71 year old man PMH psoriatic arthritis on chronic prednisone, recently hospitalized for acute hypoxic respiratory failure secondary to multilobar pneumonia, sepsis, presented to the emergency department with generalized weakness, poor oral intake and confusion. He was placed on nonrebreather mask. Admitted for acute hypoxic respiratory failure, acute kidney injury, suspected new left lobar pneumonia, concerning for aspiration.  Pt with remote hx of dysphagia 2009.  He describes s/s of esophageal reflux, reporting "fluid coming up" at night when lying down.   Subjective: pleasant, good historian Assessment / Plan / Recommendation CHL IP CLINICAL IMPRESSIONS 12/20/2016 Clinical Impression Pt presents with a mild-moderate pharyngeal dysphagia due in part to osteophytes C2-5 that are not excessively large, but impinge sufficiently into pharyngeal space such that epiglottic inversion over larynx is incomplete.  Pt presents also with base  of tongue weakness and decreased laryngeal elevation, which lead to bolus retention in the vallecular and pyriform spaces.  These issues lead to intermittent, trace aspiration of thin liquids and deep penetration of nectar-thick liquids to the vocal folds.  Aspiration is accompanied by a consistent cough.  Postural adjustments (head turns, tilts, and chin tuck) were applied, but none were reliably effective in preventing aspiration.  With controlled/small boluses, and frequent throat-clearing/double swallows, pt was better able to protect airway, although not consistently.  For now, recommend continuing current diet with strict aspiration precautions.  Pt will benefit from OP SLP (he lives close to [facility], and agress that location would be convenient) to address muscular component of his dysphagia.  D/W Dr. Sunnie Nielsen.    SLP Visit Diagnosis Dysphagia, pharyngeal phase (R13.13) Attention and concentration deficit following -- Frontal lobe and executive function deficit following -- Impact on safety and function Moderate aspiration risk   CHL IP TREATMENT RECOMMENDATION 12/20/2016 Treatment Recommendations Therapy as outlined in treatment plan below   No flowsheet data found. CHL IP DIET RECOMMENDATION 12/20/2016 SLP Diet Recommendations Regular solids;Thin liquid Liquid Administration via Straw;Cup Medication Administration Crushed with puree Compensations Slow rate;Small sips/bites;Clear throat intermittently;Multiple dry swallows after each bite/sip Postural Changes Remain semi-upright after after feeds/meals (Comment)   CHL IP OTHER RECOMMENDATIONS 12/20/2016 Recommended Consults -- Oral Care Recommendations Oral care BID Other Recommendations --   CHL IP FOLLOW UP RECOMMENDATIONS 12/20/2016 Follow up Recommendations Outpatient SLP   CHL IP FREQUENCY AND DURATION 12/20/2016 Speech Therapy Frequency (ACUTE ONLY) min 2x/week Treatment Duration 1 week      CHL IP ORAL PHASE 12/20/2016 Oral Phase WFL Oral - Pudding  Teaspoon -- Oral -  Pudding Cup -- Oral - Honey Teaspoon -- Oral - Honey Cup -- Oral - Nectar Teaspoon -- Oral - Nectar Cup -- Oral - Nectar Straw -- Oral - Thin Teaspoon -- Oral - Thin Cup -- Oral - Thin Straw -- Oral - Puree -- Oral - Mech Soft -- Oral - Regular -- Oral - Multi-Consistency -- Oral - Pill -- Oral Phase - Comment --  CHL IP PHARYNGEAL PHASE 12/20/2016 Pharyngeal Phase Impaired Pharyngeal- Pudding Teaspoon -- Pharyngeal -- Pharyngeal- Pudding Cup -- Pharyngeal -- Pharyngeal- Honey Teaspoon -- Pharyngeal -- Pharyngeal- Honey Cup -- Pharyngeal -- Pharyngeal- Nectar Teaspoon -- Pharyngeal -- Pharyngeal- Nectar Cup Delayed swallow initiation-pyriform sinuses;Reduced epiglottic inversion;Reduced anterior laryngeal mobility;Reduced airway/laryngeal closure;Reduced tongue base retraction;Penetration/Aspiration before swallow;Trace aspiration;Pharyngeal residue - valleculae;Pharyngeal residue - pyriform;Compensatory strategies attempted (with notebox) Pharyngeal Material enters airway, CONTACTS cords and not ejected out Pharyngeal- Nectar Straw -- Pharyngeal -- Pharyngeal- Thin Teaspoon -- Pharyngeal -- Pharyngeal- Thin Cup Delayed swallow initiation-pyriform sinuses;Reduced epiglottic inversion;Reduced anterior laryngeal mobility;Reduced airway/laryngeal closure;Reduced tongue base retraction;Penetration/Aspiration before swallow;Trace aspiration;Pharyngeal residue - valleculae;Pharyngeal residue - pyriform;Compensatory strategies attempted (with notebox) Pharyngeal Material enters airway, passes BELOW cords and not ejected out despite cough attempt by patient Pharyngeal- Thin Straw -- Pharyngeal -- Pharyngeal- Puree Reduced epiglottic inversion;Reduced anterior laryngeal mobility;Reduced airway/laryngeal closure;Reduced tongue base retraction;Pharyngeal residue - valleculae;Pharyngeal residue - pyriform;Compensatory strategies attempted (with notebox);Delayed swallow initiation-vallecula Pharyngeal --  Pharyngeal- Mechanical Soft -- Pharyngeal -- Pharyngeal- Regular -- Pharyngeal -- Pharyngeal- Multi-consistency -- Pharyngeal -- Pharyngeal- Pill -- Pharyngeal -- Pharyngeal Comment --  No flowsheet data found. No flowsheet data found. [name] [name] 12/20/2016, 1:48 PM                   Scheduled Meds: . amLODipine  5 mg Oral Daily  . carvedilol  25 mg Oral BID  . enoxaparin (LOVENOX) injection  40 mg Subcutaneous Q24H  . fluticasone furoate-vilanterol  1 puff Inhalation Daily  . gabapentin  300 mg Oral TID  . insulin aspart  0-15 Units Subcutaneous TID WC  . leflunomide  20 mg Oral Daily  . levothyroxine  175 mcg Oral QAC breakfast  . losartan  50 mg Oral Daily  . predniSONE  5 mg Oral Daily  . sodium chloride flush  3 mL Intravenous Q12H  . temazepam  15 mg Oral QHS   Continuous Infusions: . cefTRIAXone (ROCEPHIN)  IV 1 g (12/20/16 1824)  . metronidazole 500 mg (12/21/16 0702)     LOS: 2 days    Time spent: 35 minutes.     Alba Cory, MD [facility] Pager [phone]  If 7PM-7AM, please contact night-coverage [email or web address] Password [email or web address] 12/21/2016, 9:07 AM

## 2016-12-22 ENCOUNTER — Inpatient Hospital Stay (HOSPITAL_COMMUNITY): Payer: Medicare Other

## 2016-12-22 ENCOUNTER — Encounter (HOSPITAL_COMMUNITY): Payer: Self-pay

## 2016-12-22 LAB — CBC
HCT: 31.7 % — ABNORMAL LOW (ref 39.0–52.0)
HEMOGLOBIN: 10.4 g/dL — AB (ref 13.0–17.0)
MCH: 28.4 pg (ref 26.0–34.0)
MCHC: 32.8 g/dL (ref 30.0–36.0)
MCV: 86.6 fL (ref 78.0–100.0)
PLATELETS: 376 10*3/uL (ref 150–400)
RBC: 3.66 MIL/uL — AB (ref 4.22–5.81)
RDW: 15.4 % (ref 11.5–15.5)
WBC: 9.5 10*3/uL (ref 4.0–10.5)

## 2016-12-22 LAB — GLUCOSE, CAPILLARY
GLUCOSE-CAPILLARY: 136 mg/dL — AB (ref 65–99)
GLUCOSE-CAPILLARY: 222 mg/dL — AB (ref 65–99)
Glucose-Capillary: 232 mg/dL — ABNORMAL HIGH (ref 65–99)
Glucose-Capillary: 207 mg/dL — ABNORMAL HIGH (ref 65–99)

## 2016-12-22 LAB — CULTURE, RESPIRATORY W GRAM STAIN

## 2016-12-22 LAB — CULTURE, RESPIRATORY: CULTURE: NORMAL

## 2016-12-22 MED ORDER — METRONIDAZOLE 500 MG PO TABS
500.0000 mg | ORAL_TABLET | Freq: Three times a day (TID) | ORAL | Status: DC
  Administered 2016-12-22 – 2016-12-23 (×4): 500 mg via ORAL
  Filled 2016-12-22 (×4): qty 1

## 2016-12-22 MED ORDER — POTASSIUM CHLORIDE CRYS ER 20 MEQ PO TBCR
20.0000 meq | EXTENDED_RELEASE_TABLET | Freq: Once | ORAL | Status: AC
  Administered 2016-12-22: 20 meq via ORAL
  Filled 2016-12-22: qty 1

## 2016-12-22 MED ORDER — FUROSEMIDE 10 MG/ML IJ SOLN
20.0000 mg | Freq: Once | INTRAMUSCULAR | Status: AC
  Administered 2016-12-22: 20 mg via INTRAVENOUS
  Filled 2016-12-22: qty 2

## 2016-12-22 NOTE — Progress Notes (Signed)
Pharmacy IV to PO conversion  This patient is receiving Flagyl by the intravenous route. Based on criteria approved by the Pharmacy and Therapeutics Committee, and the Infectious Disease Division, the antibiotic(s) is/are being converted to equivalent oral dose form(s). These criteria include:   Patient being treated for a respiratory tract infection, urinary tract infection, cellulitis, or Clostridium Difficile Associated Diarrhea  The patient is not neutropenic and does not exhibit a GI malabsorption state  The patient is eating (either orally or per tube) and/or has been taking other orally administered medications for at least 24 hours.  The patient is improving clinically (physician assessment and a 24-hour Tmax of <=100.5 F)  If you have any questions about this conversion, please contact the Pharmacy Department (ext [phone]).  Thank you.  Bernadene Person, PharmD Pager: [phone] 12/22/2016, 1:58 PM

## 2016-12-22 NOTE — Progress Notes (Signed)
PROGRESS NOTE    Johnny Patrick  ZDG:[id number] DOB: 1946-02-12 DOA: 12/19/2016 PCP: Georgianne Fick, MD    Brief Narrative: 71 year old man PMH psoriatic arthritis on chronic prednisone, recently hospitalized for acute hypoxic respiratory failure secondary to multilobar pneumonia, sepsis, presented to the emergency department with generalized weakness, poor oral intake and confusion. He was placed on nonrebreather mask. Admitted for acute hypoxic respiratory failure, acute kidney injury, suspected new left lobar pneumonia, concerning for aspiration.  Patient denies any complaints at the present moment. No pain, no shortness of breath. He reports feeling "weak" since discharge from the hospital 9/10. He did complete antibiotics. He has had a cough but no shortness of breath. No focal weakness noted. He reports appetite and fluid intake has been baseline. Wife at bedside disputes this. She reports since discharge she is being gradually becoming more weak in general. No significant focal deficits noted. No dysarthria or difficulty swallowing reported. No aspiration noted. She has noted a cough and episodes of confusion that began early this morning. Because of generalized weakness, confusion and cough she brought him to the emergency department for further evaluation. No specific aggravating or alleviating factors.  ED Course: Afebrile, SBP initially 92/60, now 148/75. Initially on nonrebreather 100%. Now on nasal cannula, 100%. Treated with vancomycin, cefepime.   Assessment & Plan:   Principal Problem:   Acute respiratory failure with hypoxia (HCC) Active Problems:   PSORIATIC ARTHRITIS   Immunosuppression (HCC)   Anemia, chronic disease   Acute metabolic encephalopathy   DM type 2 (diabetes mellitus, type 2) (HCC)   AKI (acute kidney injury) (HCC)   Lobar pneumonia (HCC)   1-Acute hypoxic Respiratory Failure;  Secondary to PNA.  Stable on 2 L of Oxygen.  Treating PNA. Check  oxygen on ambulation.   2-PNA left lower lobar PNA> Aspiration PNA Speech evaluation. Patient with mild to moderate pharyngeal dysphagia.  Continue with ceftriaxone  and flagyl.  WBC trending down.  Aspiration precaution.  Culture sputum consistent with normal flora Repeat chest x ray for follow up/  At risk for readmission and recurrent infection, due to aspiration risk, explain to wife.   3-Acute encephalopathy; secondary to infection , improved.   4-AKI, hyperkalemia; improved with fluids.   5-HTN; continue with Norvasc, coreg, cozaar.  PRN Hydralazine.   6-Psoriatic arthritis with chronic immunosuppression secondary to chronic prednisone -Continue prednisone.  7-Diabetes mellitus type 2, with hyperglycemia.  -SSI -HbA1. 8.7 Will start metformin at discharge   8-Anemia of chronic disease. Stable.    DVT prophylaxis: Lovenox Code Status: Full code.  Family Communication: care discussed with patient.  Disposition Plan: home when stable. .   Consultants:   none   Procedures: none   Antimicrobials:   Vancomycin, flagyl.    Subjective: Breathing better.   Objective: Vitals:   12/22/16 0505 12/22/16 0640 12/22/16 0808 12/22/16 1213  BP: (!) 173/81 133/77    Pulse: 68     Resp: 17     Temp: 98 F (36.7 C)     TempSrc: Oral     SpO2: 95%  92% 90%  Weight:      Height:        Intake/Output Summary (Last 24 hours) at 12/22/16 1347 Last data filed at 12/22/16 0700  Gross per 24 hour  Intake              490 ml  Output             1350 ml  Net             -  860 ml   Filed Weights   12/19/16 0434  Weight: 63 kg (139 lb)    Examination:  General exam:NAD Respiratory system: CTA Cardiovascular system: S 1, S 2 RRR Gastrointestinal system: Abdomen soft,nt Central nervous system: Alert and oriented. Extremities:  Left LE with BKA Skin: No rashes, lesions or ulcers   Data Reviewed: I have personally reviewed following labs and imaging  studies  CBC:  Recent Labs Lab 12/19/16 0447 12/20/16 0328 12/21/16 0446 12/22/16 0431  WBC 16.6* 14.1* 12.2* 9.5  NEUTROABS 15.1*  --   --   --   HGB 10.2* 9.4* 9.5* 10.4*  HCT 31.4* 28.1* 28.9* 31.7*  MCV 87.5 88.4 86.5 86.6  PLT 318 301 366 376   Basic Metabolic Panel:  Recent Labs Lab 12/19/16 0447 12/20/16 0328 12/21/16 0446  NA 136 136 137  K 5.2* 4.3 3.6  CL 101 103 102  CO2 25 26 28   GLUCOSE 308* 190* 162*  BUN 43* 17 14  CREATININE 1.35* 0.64 0.57*  CALCIUM 9.1 8.3* 8.7*   GFR: Estimated Creatinine Clearance: 73.4 mL/min (A) (by C-G formula based on SCr of 0.57 mg/dL (L)). Liver Function Tests:  Recent Labs Lab 12/19/16 0447  AST 31  ALT 25  ALKPHOS 107  BILITOT 0.5  PROT 6.2*  ALBUMIN 2.8*   No results for input(s): LIPASE, AMYLASE in the last 168 hours. No results for input(s): AMMONIA in the last 168 hours. Coagulation Profile:  Recent Labs Lab 12/19/16 0447  INR 1.01   Cardiac Enzymes:  Recent Labs Lab 12/19/16 0504  TROPONINI <0.03   BNP (last 3 results) No results for input(s): PROBNP in the last 8760 hours. HbA1C:  Recent Labs  12/20/16 1434  HGBA1C 8.7*   CBG:  Recent Labs Lab 12/21/16 1232 12/21/16 1801 12/21/16 2150 12/22/16 0808 12/22/16 1215  GLUCAP 283* 163* 183* 136* 222*   Lipid Profile: No results for input(s): CHOL, HDL, LDLCALC, TRIG, CHOLHDL, LDLDIRECT in the last 72 hours. Thyroid Function Tests: No results for input(s): TSH, T4TOTAL, FREET4, T3FREE, THYROIDAB in the last 72 hours. Anemia Panel: No results for input(s): VITAMINB12, FOLATE, FERRITIN, TIBC, IRON, RETICCTPCT in the last 72 hours. Sepsis Labs:  Recent Labs Lab 12/19/16 0508 12/19/16 0906  LATICACIDVEN 2.46* 1.22    Recent Results (from the past 240 hour(s))  Culture, blood (Routine x 2)     Status: None   Collection Time: 12/13/16  5:05 AM  Result Value Ref Range Status   Specimen Description BLOOD RIGHT FOREARM  Final    Special Requests   Final    BOTTLES DRAWN AEROBIC AND ANAEROBIC Blood Culture adequate volume   Culture   Final    NO GROWTH 5 DAYS Performed at [facility], [address], [place], [place] [address]    Report Status 12/18/2016 FINAL  Final  Culture, blood (Routine x 2)     Status: None   Collection Time: 12/13/16  5:05 AM  Result Value Ref Range Status   Specimen Description BLOOD LEFT FOREARM  Final   Special Requests   Final    BOTTLES DRAWN AEROBIC AND ANAEROBIC Blood Culture adequate volume   Culture   Final    NO GROWTH 5 DAYS Performed at [facility], [address], [place], [place] [address]    Report Status 12/18/2016 FINAL  Final  MRSA PCR Screening     Status: None   Collection Time: 12/13/16  8:51 AM  Result Value  Ref Range Status   MRSA by PCR NEGATIVE NEGATIVE Final    Comment:        The GeneXpert MRSA Assay (FDA approved for NASAL specimens only), is one component of a comprehensive MRSA colonization surveillance program. It is not intended to diagnose MRSA infection nor to guide or monitor treatment for MRSA infections.   C difficile quick scan w PCR reflex     Status: None   Collection Time: 12/14/16  2:41 PM  Result Value Ref Range Status   C Diff antigen NEGATIVE NEGATIVE Final   C Diff toxin NEGATIVE NEGATIVE Final   C Diff interpretation No C. difficile detected.  Final  Culture, blood (Routine x 2)     Status: None (Preliminary result)   Collection Time: 12/19/16  4:47 AM  Result Value Ref Range Status   Specimen Description BLOOD RIGHT FOREARM  Final   Special Requests   Final    BOTTLES DRAWN AEROBIC AND ANAEROBIC Blood Culture results may not be optimal due to an inadequate volume of blood received in culture bottles   Culture   Final    NO GROWTH 3 DAYS Performed at [facility], [address], [place], [place] [address]    Report Status PENDING  Incomplete  Culture, blood (Routine x 2)     Status: None (Preliminary  result)   Collection Time: 12/19/16  4:47 AM  Result Value Ref Range Status   Specimen Description BLOOD LEFT FOREARM  Final   Special Requests   Final    BOTTLES DRAWN AEROBIC AND ANAEROBIC Blood Culture results may not be optimal due to an inadequate volume of blood received in culture bottles   Culture   Final    NO GROWTH 3 DAYS Performed at [facility], [address] [place], [place] [address]    Report Status PENDING  Incomplete  MRSA PCR Screening     Status: None   Collection Time: 12/19/16  4:24 PM  Result Value Ref Range Status   MRSA by PCR NEGATIVE NEGATIVE Final    Comment:        The GeneXpert MRSA Assay (FDA approved for NASAL specimens only), is one component of a comprehensive MRSA colonization surveillance program. It is not intended to diagnose MRSA infection nor to guide or monitor treatment for MRSA infections.   Culture, sputum-assessment     Status: None   Collection Time: 12/19/16  9:09 PM  Result Value Ref Range Status   Specimen Description EXPECTORATED SPUTUM  Final   Special Requests NONE  Final   Sputum evaluation THIS SPECIMEN IS ACCEPTABLE FOR SPUTUM CULTURE  Final   Report Status 12/19/2016 FINAL  Final  Culture, respiratory (NON-Expectorated)     Status: None   Collection Time: 12/19/16  9:09 PM  Result Value Ref Range Status   Specimen Description EXPECTORATED SPUTUM  Final   Special Requests NONE Reflexed from [id number]  Final   Gram Stain   Final    RARE WBC PRESENT, PREDOMINANTLY PMN RARE GRAM POSITIVE RODS    Culture   Final    Consistent with normal respiratory flora. Performed at [facility], [address], [place], [place] [address]    Report Status 12/22/2016 FINAL  Final         Radiology Studies: No results found.      Scheduled Meds: . amLODipine  5 mg Oral Daily  . carvedilol  25 mg Oral BID  . enoxaparin (  LOVENOX) injection  40 mg Subcutaneous Q24H  . fluticasone furoate-vilanterol  1 puff  Inhalation Daily  . gabapentin  300 mg Oral TID  . insulin aspart  0-15 Units Subcutaneous TID WC  . leflunomide  20 mg Oral Daily  . levothyroxine  175 mcg Oral QAC breakfast  . losartan  50 mg Oral Daily  . predniSONE  5 mg Oral Daily  . sodium chloride flush  3 mL Intravenous Q12H  . temazepam  15 mg Oral QHS   Continuous Infusions: . cefTRIAXone (ROCEPHIN)  IV Stopped (12/21/16 1901)  . metronidazole Stopped (12/22/16 0740)     LOS: 3 days    Time spent: 35 minutes.     Alba Cory, MD [facility] Pager [phone]  If 7PM-7AM, please contact night-coverage [email or web address] Password [email or web address] 12/22/2016, 1:47 PM

## 2016-12-22 NOTE — Evaluation (Signed)
Physical Therapy Evaluation Patient Details Name: Johnny Patrick MRN: [id number] DOB: 1945-05-29 Today's Date: 12/22/2016   History of Present Illness  71 year old man with PMH of psoriatic arthritis on chronic prednisone, L BKA, multiple back surgeries recently hospitalized for acute hypoxic respiratory failure secondary to multilobar pneumonia, sepsis, presented to the emergency department with generalized weakness, poor oral intake and confusion. He was placed on nonrebreather mask. Admitted for acute hypoxic respiratory failure, acute kidney injury, suspected new left lobar pneumonia  Clinical Impression  Pt admitted with above diagnosis. Pt currently with functional limitations due to the deficits listed below (see PT Problem List). Pt ambulated 160' with RW, no loss of balance, SaO2 90% on room air.  Pt will benefit from skilled PT to increase their independence and safety with mobility to allow discharge to the venue listed below.       Follow Up Recommendations No PT follow up (pt declined HHPT)    Equipment Recommendations  None recommended by PT    Recommendations for Other Services       Precautions / Restrictions Precautions Precautions: Fall Precaution Comments: L BKA-uses prosthesis; denies falls in past 1 year Restrictions Weight Bearing Restrictions: No      Mobility  Bed Mobility   Bed Mobility: Supine to Sit     Supine to sit: Min assist     General bed mobility comments: min A to raise trunk  Transfers Overall transfer level: Needs assistance Equipment used: Rolling walker (2 wheeled) Transfers: Sit to/from Stand Sit to Stand: Supervision         General transfer comment: close guard for safety. VCs safety, hand placement.   Ambulation/Gait Ambulation/Gait assistance: Supervision Ambulation Distance (Feet): 160 Feet Assistive device: Rolling walker (2 wheeled) Gait Pattern/deviations: Step-through pattern     General Gait Details: close  guard for safety.  O 2 sats 90% on room air, no LOB, ambulated with RW and LLE prosthesis  Stairs            Wheelchair Mobility    Modified Rankin (Stroke Patients Only)       Balance Overall balance assessment: Needs assistance   Sitting balance-Leahy Scale: Good     Standing balance support: Bilateral upper extremity supported Standing balance-Leahy Scale: Fair                               Pertinent Vitals/Pain Pain Assessment: 0-10 Faces Pain Scale: Hurts worst Pain Location: posterior neck Pain Descriptors / Indicators: Sore Pain Intervention(s): Limited activity within patient's tolerance;Monitored during session;Premedicated before session;Heat applied    Home Living Family/patient expects to be discharged to:: Private residence Living Arrangements: Spouse/significant other Available Help at Discharge: Family Type of Home: House Home Access: Stairs to enter Entrance Stairs-Rails: None Entrance Stairs-Number of Steps: 2 Home Layout: One level;Laundry or work area in [employer] Equipment: Environmental consultant - 2 wheels;Cane - single point;Bedside commode;Shower seat;Adaptive equipment;Walker - 4 wheels      Prior Function Level of Independence: Independent with assistive device(s)         Comments: uses rollator vs cane for ambulation     Hand Dominance        Extremity/Trunk Assessment   Upper Extremity Assessment Upper Extremity Assessment: Overall WFL for tasks assessed    Lower Extremity Assessment Lower Extremity Assessment: Overall WFL for tasks assessed    Cervical / Trunk Assessment Cervical / Trunk Assessment: Kyphotic (forward head)  Communication  Communication: No difficulties  Cognition Arousal/Alertness: Awake/alert Behavior During Therapy: WFL for tasks assessed/performed Overall Cognitive Status: Within Functional Limits for tasks assessed                                        General Comments       Exercises     Assessment/Plan    PT Assessment Patient needs continued PT services  PT Problem List Decreased strength;Decreased mobility;Decreased balance;Decreased knowledge of use of DME;Decreased activity tolerance       PT Treatment Interventions      PT Goals (Current goals can be found in the Care Plan section)  Acute Rehab PT Goals Patient Stated Goal: home soon. regain strength PT Goal Formulation: With patient Time For Goal Achievement: 01/05/17 Potential to Achieve Goals: Good    Frequency Min 3X/week   Barriers to discharge        Co-evaluation               AM-PAC PT "6 Clicks" Daily Activity  Outcome Measure Difficulty turning over in bed (including adjusting bedclothes, sheets and blankets)?: A Little Difficulty moving from lying on back to sitting on the side of the bed? : Unable Difficulty sitting down on and standing up from a chair with arms (e.g., wheelchair, bedside commode, etc,.)?: A Little Help needed moving to and from a bed to chair (including a wheelchair)?: A Little Help needed walking in hospital room?: A Little Help needed climbing 3-5 steps with a railing? : A Little 6 Click Score: 16    End of Session Equipment Utilized During Treatment: Gait belt Activity Tolerance: Patient tolerated treatment well Patient left: in chair;with call bell/phone within reach Nurse Communication: Mobility status PT Visit Diagnosis: Muscle weakness (generalized) (M62.81);Difficulty in walking, not elsewhere classified (R26.2)    Time: 0258-5277 PT Time Calculation (min) (ACUTE ONLY): 24 min   Charges:   PT Evaluation $PT Eval Low Complexity: 1 Low PT Treatments $Gait Training: 8-22 mins   PT G Codes:         Tamala Ser 12/22/2016, 12:19 PM [phone]

## 2016-12-23 LAB — BASIC METABOLIC PANEL
Anion gap: 9 (ref 5–15)
BUN: 23 mg/dL — ABNORMAL HIGH (ref 6–20)
CHLORIDE: 97 mmol/L — AB (ref 101–111)
CO2: 28 mmol/L (ref 22–32)
CREATININE: 0.61 mg/dL (ref 0.61–1.24)
Calcium: 8.8 mg/dL — ABNORMAL LOW (ref 8.9–10.3)
GFR calc non Af Amer: >60 mL/min (ref >60)
Glucose, Bld: 268 mg/dL — ABNORMAL HIGH (ref 65–99)
POTASSIUM: 3.7 mmol/L (ref 3.5–5.1)
SODIUM: 134 mmol/L — AB (ref 135–145)

## 2016-12-23 LAB — GLUCOSE, CAPILLARY
GLUCOSE-CAPILLARY: 285 mg/dL — AB (ref 65–99)
Glucose-Capillary: 145 mg/dL — ABNORMAL HIGH (ref 65–99)

## 2016-12-23 MED ORDER — GLIPIZIDE 5 MG PO TABS: 2.5000 mg | ORAL_TABLET | Freq: Every day | ORAL | Status: DC

## 2016-12-23 MED ORDER — METRONIDAZOLE 500 MG PO TABS: 500.0000 mg | ORAL_TABLET | Freq: Three times a day (TID) | ORAL | 0 refills | Status: DC

## 2016-12-23 MED ORDER — BLOOD GLUCOSE METER KIT: PACK | 0 refills | Status: AC

## 2016-12-23 MED ORDER — GLUCERNA PO LIQD: 237.0000 mL | Freq: Two times a day (BID) | ORAL | 0 refills | Status: AC

## 2016-12-23 MED ORDER — GLIPIZIDE 5 MG PO TABS: 2.5000 mg | ORAL_TABLET | Freq: Every day | ORAL | 0 refills | Status: AC

## 2016-12-23 MED ORDER — CEFUROXIME AXETIL 500 MG PO TABS: 500.0000 mg | ORAL_TABLET | Freq: Two times a day (BID) | ORAL | 0 refills | Status: AC

## 2016-12-23 NOTE — Progress Notes (Signed)
PHYSICAL THERAPY  SATURATION QUALIFICATIONS: (This note is used to comply with regulatory documentation for home oxygen)  Patient Saturations on Room Air at Rest = 94%  Patient Saturations on Room Air while Ambulating 85 feet  = 93%  Patient Saturations on                Liters of oxygen while Ambulating =         %  Please briefly explain why patient needs home oxygen:  Pt does NOT need supplemental oxygen during activity.    Felecia Shelling  PTA [facility]  Acute  Rehab Pager      [phone]

## 2016-12-23 NOTE — Progress Notes (Signed)
Pt's RN called to say pt changed his mind and now want HH. Spoke with pt who selected [facility] for [facility] needs. Referral given to in house rep.

## 2016-12-23 NOTE — Progress Notes (Signed)
Pt declined to [facility] at present time. Pt states that his wife is a Engineer, civil (consulting) and he will not need HHRN or HHPT. Wife at bedside.

## 2016-12-23 NOTE — Progress Notes (Signed)
Physical Therapy Treatment Patient Details Name: Johnny Patrick MRN: [id number] DOB: [birth year] Today's Date: 12/23/2016    History of Present Illness 71 year old man with PMH of psoriatic arthritis on chronic prednisone, L BKA, multiple back surgeries recently hospitalized for acute hypoxic respiratory failure secondary to multilobar pneumonia, sepsis, presented to the emergency department with generalized weakness, poor oral intake and confusion. He was placed on nonrebreather mask. Admitted for acute hypoxic respiratory failure, acute kidney injury, suspected new left lobar pneumonia    PT Comments    Assisted to EOB.  Pt able to [facility] prosthetic leg at Supervision.  Assisted with amb in hallway with walker.  Finger sensor on tele reported RA 77% and inconsistent however dynamat reported 93%.  No LOB.  No dyspnea.  Tolerated distance well.     SATURATION QUALIFICATIONS: (This note is used to comply with regulatory documentation for home oxygen)  Patient Saturations on Room Air at Rest = 94%  Patient Saturations on Room Air while Ambulating 85 feet  = 93%  Please briefly explain why patient needs home oxygen:  Pt does NOT require supplemental oxygen during activity.     Follow Up Recommendations  Home health PT (pt now agrees to [facility])     Equipment Recommendations  None recommended by PT    Recommendations for Other Services       Precautions / Restrictions Precautions Precautions: Fall Precaution Comments: L BKA-uses prosthesis; denies falls in past 1 year Restrictions Weight Bearing Restrictions: No    Mobility  Bed Mobility Overal bed mobility: Modified Independent Bed Mobility: Supine to Sit;Sit to Supine     Supine to sit: Modified independent (Device/Increase time) Sit to supine: Modified independent (Device/Increase time)   General bed mobility comments: increased time  Transfers Overall transfer level: Needs assistance Equipment used: Rolling walker (2  wheeled) Transfers: Sit to/from [employer] Sit to Stand: Supervision Stand pivot transfers: Supervision       General transfer comment: Supervision or safety. VCs safety, hand placement.   Pt able to [employer] prosthetic leg at Supervision while EOB.    Ambulation/Gait Ambulation/Gait assistance: Supervision Ambulation Distance (Feet): 85 Feet Assistive device: Rolling walker (2 wheeled) Gait Pattern/deviations: Step-through pattern Gait velocity: WFL   General Gait Details: close guard for safety.  O 2 sats 93% on room air, no LOB, ambulated with RW and LLE prosthesis   Stairs            Wheelchair Mobility    Modified Rankin (Stroke Patients Only)       Balance                                            Cognition Arousal/Alertness: Awake/alert Behavior During Therapy: WFL for tasks assessed/performed Overall Cognitive Status: Within Functional Limits for tasks assessed                                        Exercises      General Comments        Pertinent Vitals/Pain Pain Assessment: 0-10 Pain Score: 5  Pain Location: back, hip, neck "everywhere" Pain Descriptors / Indicators: Constant Pain Intervention(s): Monitored during session;Repositioned;Patient requesting pain meds-RN notified    Home Living  Prior Function            PT Goals (current goals can now be found in the care plan section) Progress towards PT goals: Progressing toward goals    Frequency    Min 3X/week      PT Plan Current plan remains appropriate    Co-evaluation              AM-PAC PT "6 Clicks" Daily Activity  Outcome Measure  Difficulty turning over in bed (including adjusting bedclothes, sheets and blankets)?: A Little Difficulty moving from lying on back to sitting on the side of the bed? : Unable Difficulty sitting down on and standing up from a chair with arms (e.g.,  wheelchair, bedside commode, etc,.)?: A Little Help needed moving to and from a bed to chair (including a wheelchair)?: A Little Help needed walking in hospital room?: A Little Help needed climbing 3-5 steps with a railing? : A Little 6 Click Score: 16    End of Session Equipment Utilized During Treatment: Gait belt Activity Tolerance: Patient tolerated treatment well Patient left: in bed;with call bell/phone within reach;with family/visitor present;with bed alarm set Nurse Communication: Mobility status (RA lowest was 93% ) PT Visit Diagnosis: Muscle weakness (generalized) (M62.81);Difficulty in walking, not elsewhere classified (R26.2)     Time: 1545-1610 PT Time Calculation (min) (ACUTE ONLY): 25 min  Charges:  $Gait Training: 8-22 mins $Therapeutic Activity: 8-22 mins                    G Codes:       {Evelin Cake  PTA [facility]  Acute  Rehab Pager      [phone]

## 2016-12-23 NOTE — Care Management Important Message (Signed)
Important Message  Patient Details  Name: Johnny Patrick MRN: [id number] Date of Birth: 10-11-1945   Medicare Important Message Given:  Yes    Caren Macadam 12/23/2016, 9:28 AMImportant Message  Patient Details  Name: Johnny Patrick MRN: [id number] Date of Birth: [birth year]   Medicare Important Message Given:  Yes    Caren Macadam 12/23/2016, 9:28 AM

## 2016-12-23 NOTE — Discharge Summary (Signed)
Physician Discharge Summary  EMMERT ROETHLER OXB:[id number] DOB: 08-02-1945 DOA: 12/19/2016  PCP: Merrilee Seashore, MD  Admit date: 12/19/2016 Discharge date: 12/23/2016  Admitted From: Home  Disposition:  Home   Recommendations for Outpatient Follow-up:  1. Follow up with PCP in 1-2 weeks 2. Please obtain BMP/CBC in one week 3. Needs to follow up with neurosurgeon for osteophytes.   Home Health: yes  Discharge Condition: stable.  CODE STATUS: full code.  Diet recommendation: Heart Healthy   Brief/Interim Summary: 71 year old man PMH psoriatic arthritis on chronic prednisone, recently hospitalized for acute hypoxic respiratory failure secondary to multilobar pneumonia, sepsis, presented to the emergency department with generalized weakness, poor oral intake and confusion. He was placed on nonrebreather mask. Admitted for acute hypoxic respiratory failure, acute kidney injury, suspected new left lobar pneumonia, concerning for aspiration.  Patient denies any complaints at the present moment. No pain, no shortness of breath. He reports feeling "weak" since discharge from the hospital 9/10. He did complete antibiotics. He has had a cough but no shortness of breath. No focal weakness noted. He reports appetite and fluid intake has been baseline. Wife at bedside disputes this. She reports since discharge she is being gradually becoming more weak in general. No significant focal deficits noted. No dysarthria or difficulty swallowing reported. No aspiration noted. She has noted a cough and episodes of confusion that began early this morning. Because of generalized weakness, confusion and cough she brought him to the emergency department for further evaluation. No specific aggravating or alleviating factors.  ED Course:Afebrile, SBP initially 92/60, now 148/75. Initially on nonrebreather 100%. Now on nasal cannula, 100%. Treated with vancomycin, cefepime.   Assessment & Plan:   Principal  Problem:   Acute respiratory failure with hypoxia (HCC) Active Problems:   PSORIATIC ARTHRITIS   Immunosuppression (HCC)   Anemia, chronic disease   Acute metabolic encephalopathy   DM type 2 (diabetes mellitus, type 2) (HCC)   AKI (acute kidney injury) ([place])   Lobar pneumonia ([place])   1-Acute hypoxic Respiratory Failure;  Secondary to PNA.  Stable on 2 L of Oxygen.  Treating PNA. Check oxygen on ambulation.  WBC normal. Repeated chest x ray 9-16; bilateral atelectasias and pulmonary congestion. Received one time dose of lasix.  Might need oxygen at discharge.   2-PNA left lower lobar PNA> Aspiration PNA Speech evaluation. Patient with mild to moderate pharyngeal dysphagia.  Treated with ceftriaxone  and flagyl for 4 days in the hospital. He will be discharge on ceftin and flagyl for 7 days.  WBC trending down.  Aspiration precaution.  Culture sputum consistent with normal flora Repeat chest x ray for follow up/  Atelectasis at bases, and pulmonary vascula congestion.  At risk for readmission and recurrent infection, due to aspiration risk, explain to wife.   3-Acute encephalopathy; secondary to infection , improved.   4-AKI, hyperkalemia; improved with fluids.  cr stable. Hold cozaar.   5-HTN; continue with Norvasc, coreg. Hold cozaar due to AKI PRN Hydralazine.   6-Psoriatic arthritis with chronic immunosuppression secondary to chronic prednisone -Continue prednisone.  7-Diabetes mellitus type 2, with hyperglycemia.  -SSI -HbA1. 8.7 Will start low dose glipizide. This medication will need to be titrated as needed.   8-Anemia of chronic disease. Stable.    Discharge Diagnoses:    Acute respiratory failure with hypoxia (HCC)   Aspiration Pneumonia.    PSORIATIC ARTHRITIS   Immunosuppression (HCC)   Anemia, chronic disease   Acute metabolic encephalopathy   DM type  2 (diabetes mellitus, type 2) ([place])   AKI (acute kidney injury) ([place])   Lobar pneumonia  ([place])    Discharge Instructions  Discharge Instructions    Diet - low sodium heart healthy    Complete by:  As directed    Increase activity slowly    Complete by:  As directed      Allergies as of 12/23/2016      Reactions   Amoxicillin-pot Clavulanate    "liver problems" highly questionable because he was in septic shock   Sulfur Rash      Medication List    STOP taking these medications   levofloxacin 750 MG tablet Commonly known as:  LEVAQUIN   losartan 50 MG tablet Commonly known as:  COZAAR   oxyCODONE-acetaminophen 10-325 MG tablet Commonly known as:  PERCOCET     TAKE these medications   amLODipine 5 MG tablet Commonly known as:  NORVASC Take 1 tablet (5 mg total) by mouth daily.   blood glucose meter kit and supplies Dispense based on patient and insurance preference. Use up to four times daily as directed. (FOR ICD-9 250.00, 250.01).   CALCIUM 600 600 MG Tabs tablet Generic drug:  calcium carbonate Take 600 mg by mouth 3 (three) times daily.   carvedilol 25 MG tablet Commonly known as:  COREG TAKE ONE TABLET TWICE DAILY   cefUROXime 500 MG tablet Commonly known as:  CEFTIN Take 1 tablet (500 mg total) by mouth 2 (two) times daily.   cyclobenzaprine 10 MG tablet Commonly known as:  FLEXERIL Take 10 mg by mouth every 8 (eight) hours as needed. For muscle pain.   Fish Oil 1000 MG Caps Take 1 capsule by mouth 3 (three) times daily.   fluticasone furoate-vilanterol 100-25 MCG/INH Aepb Commonly known as:  BREO ELLIPTA Inhale 1 puff into the lungs daily. Inhale 1 puff then rinse mouth, once daily   folic acid 1 MG tablet Commonly known as:  FOLVITE Take 1 mg by mouth daily.   gabapentin 300 MG capsule Commonly known as:  NEURONTIN Take 1 capsule by mouth 3 (three) times daily.   glipiZIDE 5 MG tablet Commonly known as:  GLUCOTROL Take 0.5 tablets (2.5 mg total) by mouth daily before breakfast.   GLUCERNA Liqd Take 237 mLs by mouth 2 (two)  times daily between meals.   HYDROmorphone 2 MG tablet Commonly known as:  DILAUDID Take 2 mg by mouth every 4 (four) hours.   leflunomide 10 MG tablet Commonly known as:  ARAVA Take 20 mg by mouth daily.   levothyroxine 150 MCG tablet Commonly known as:  SYNTHROID, LEVOTHROID Take 175 mcg by mouth daily.   metroNIDAZOLE 500 MG tablet Commonly known as:  FLAGYL Take 1 tablet (500 mg total) by mouth every 8 (eight) hours.   multivitamin with minerals Tabs tablet Take 1 tablet by mouth daily.   predniSONE 5 MG tablet Commonly known as:  DELTASONE Take 5 mg by mouth daily.   SYMBICORT 160-4.5 MCG/ACT inhaler Generic drug:  budesonide-formoterol USE TWO PUFFS TWICE DAILY   temazepam 15 MG capsule Commonly known as:  RESTORIL TAKE 1 OR 2 CAPSULES AT BEDTIME AS NEEDED FOR SLEEP.   triamcinolone cream 0.1 % Commonly known as:  KENALOG Apply topically.   Vitamin C 500 MG Chew Chew 500 mg by mouth daily.            Durable Medical Equipment        Start     Ordered  12/23/16 1438  For home use only DME oxygen  Once    Question Answer Comment  Mode or (Route) Nasal cannula   Liters per Minute 2   Frequency Continuous (stationary and portable oxygen unit needed)   Oxygen delivery system Gas      12/23/16 1437       Discharge Care Instructions        Start     Ordered   12/24/16 0000  glipiZIDE (GLUCOTROL) 5 MG tablet  Daily before breakfast     12/23/16 1435   12/23/16 0000  metroNIDAZOLE (FLAGYL) 500 MG tablet  Every 8 hours     12/23/16 1435   12/23/16 0000  cefUROXime (CEFTIN) 500 MG tablet  2 times daily     12/23/16 1435   12/23/16 0000  Increase activity slowly     12/23/16 1435   12/23/16 0000  Diet - low sodium heart healthy     12/23/16 1435   12/23/16 0000  blood glucose meter kit and supplies    Question Answer Comment  Number of strips 30   Number of lancets 30      12/23/16 1437   12/23/16 0000  GLUCERNA (GLUCERNA) LIQD  2 times  daily between meals     12/23/16 1448      Allergies  Allergen Reactions  . Amoxicillin-Pot Clavulanate     "liver problems" highly questionable because he was in septic shock  . Sulfur Rash    Consultations: none  Procedures/Studies: Dg Chest 2 View  Result Date: 12/22/2016 CLINICAL DATA:  Cough and congestion EXAM: CHEST  2 VIEW COMPARISON:  December 19, 2016 FINDINGS: The cardiomediastinal silhouette is stable. Platelike opacity in the bases on the lateral view favored represent atelectasis rather than infiltrate. Suggested mild pulmonary venous congestion/edema. No other interval changes or acute abnormalities. IMPRESSION: 1. Opacity in the bases on the lateral view is favored to be atelectasis. Recommend attention on follow-up. 2. Cardiomegaly and mild pulmonary venous congestion/edema. Electronically Signed   By: Dorise Bullion III M.D   On: 12/22/2016 14:21   Dg Chest 2 View  Result Date: 12/13/2016 CLINICAL DATA:  Increasing weakness, fever, and shortness of breath tonight. Possible code sepsis. EXAM: CHEST  2 VIEW COMPARISON:  09/27/2013 FINDINGS: Shallow inspiration. Mild cardiac enlargement. Bilateral perihilar and upper lung infiltrates. These could represent multifocal pneumonia or edema. Aspiration could potentially have this appearance in the appropriate clinical setting. No blunting of costophrenic angles. No pneumothorax. Calcified and tortuous aorta. Postoperative changes in the lumbar spine. Degenerative changes in the shoulders. IMPRESSION: Bilateral perihilar and upper lung infiltrates suggesting multifocal pneumonia or edema. Electronically Signed   By: Lucienne Capers M.D.   On: 12/13/2016 05:26   Ct Head Wo Contrast  Result Date: 12/13/2016 CLINICAL DATA:  Altered level consciousness. History of hypothyroidism, hypertension. EXAM: CT HEAD WITHOUT CONTRAST TECHNIQUE: Contiguous axial images were obtained from the base of the skull through the vertex without  intravenous contrast. COMPARISON:  10/26/2007 FINDINGS: Brain: No evidence of acute infarction, hemorrhage, extra-axial collection, ventriculomegaly, or mass effect. Generalized cerebral atrophy. Periventricular white matter low attenuation likely secondary to microangiopathy. Vascular: Cerebrovascular atherosclerotic calcifications are noted. Skull: Negative for fracture or focal lesion. Sinuses/Orbits: Visualized portions of the orbits are unremarkable. Visualized portions of the paranasal sinuses and mastoid air cells are unremarkable. Other: None. IMPRESSION: 1. No acute intracranial pathology. 2. Chronic microvascular disease and cerebral atrophy. Electronically Signed   By: Kathreen Devoid   On: 12/13/2016  08:50   Dg Chest Port 1 View  Result Date: 12/19/2016 CLINICAL DATA:  71 y/o M; increased weakness and shortness of breath. Recent discharge for pneumonia. EXAM: PORTABLE CHEST 1 VIEW COMPARISON:  12/13/2016 chest radiograph. FINDINGS: Near complete resolution of consolidation is in the lungs bilaterally with minimal streaky residual in the left upper lung zone and bilateral lower lung zones. Stable cardiac silhouette given projection and technique. Aortic atherosclerosis with calcification. Partially visualized lumbar fusion hardware. No acute osseous abnormality identified. IMPRESSION: Near complete resolution of pneumonia with minimal residual in the left upper lung zone and bilateral lung bases. Electronically Signed   By: Kristine Garbe M.D.   On: 12/19/2016 05:33   Dg Swallowing Func-speech Pathology  Result Date: 12/20/2016 Objective Swallowing Evaluation: Type of Study: MBS-Modified Barium Swallow Study Patient Details Name: Johnny Patrick MRN: [id number] Date of Birth: 01-24-1946 Today's Date: 12/20/2016 Time: SLP Start Time (ACUTE ONLY): 1230-SLP Stop Time (ACUTE ONLY): 1300 SLP Time Calculation (min) (ACUTE ONLY): 30 min Past Medical History: Past Medical History: Diagnosis Date .  Anemia  . Anemia, chronic disease 10/14/2011 . 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 Chronic airway obstruction, not elsewhere classified  . 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 Diabetes ([place])  . Diabetic foot ulcer ([place]) 09/18/2016 . Esophageal reflux  . Hypothyroidism  . Low back pain  . MRSA (methicillin resistant Staphylococcus aureus)  . MSSA (methicillin susceptible Staphylococcus aureus) septicemia ([place])   L 2nd toe-amputated June 2011 . Nephrolithiasis  . Osteoarthritis  . Psoriatic arthritis ([place])  . Psoriatic arthritis ([place]) 10/14/2011 . Shingles   x2 . Sleep apnea   cpap . Unspecified essential hypertension  Past Surgical History: Past Surgical History: Procedure Laterality Date . BACK SURGERY    3 fusions, 4th - undo 3rd fusion . THORACIC SPINE SURGERY   . TOE AMPUTATION   . TOE SURGERY  2009  little toe on right foot , bone removed . TOTAL HIP ARTHROPLASTY    bilateral HPI: 71 year old man PMH psoriatic arthritis on chronic prednisone, recently hospitalized for acute hypoxic respiratory failure secondary to multilobar pneumonia, sepsis, presented to the emergency department with generalized weakness, poor oral intake and confusion. He was placed on nonrebreather mask. Admitted for acute hypoxic respiratory failure, acute kidney injury, suspected new left lobar pneumonia, concerning for aspiration.  Pt with remote hx of dysphagia 2009.  He describes s/s of esophageal reflux, reporting "fluid coming up" at night when lying down.   Subjective: pleasant, good historian Assessment / Plan / Recommendation CHL IP CLINICAL IMPRESSIONS 12/20/2016 Clinical Impression Pt presents with a mild-moderate pharyngeal dysphagia due in part to osteophytes C2-5 that are not excessively large, but impinge sufficiently into pharyngeal space such that epiglottic inversion over larynx is incomplete.  Pt presents also with base of tongue weakness and decreased laryngeal elevation, which lead to bolus retention in the vallecular and  pyriform spaces.  These issues lead to intermittent, trace aspiration of thin liquids and deep penetration of nectar-thick liquids to the vocal folds.  Aspiration is accompanied by a consistent cough.  Postural adjustments (head turns, tilts, and chin tuck) were applied, but none were reliably effective in preventing aspiration.  With controlled/small boluses, and frequent throat-clearing/double swallows, pt was better able to protect airway, although not consistently.  For now, recommend continuing current diet with strict aspiration precautions.  Pt will benefit from OP SLP (he lives close to [facility], and agress that location would be convenient) to address muscular component of  his dysphagia.  D/W Dr. Tyrell Antonio.    SLP Visit Diagnosis Dysphagia, pharyngeal phase (R13.13) Attention and concentration deficit following -- Frontal lobe and executive function deficit following -- Impact on safety and function Moderate aspiration risk   CHL IP TREATMENT RECOMMENDATION 12/20/2016 Treatment Recommendations Therapy as outlined in treatment plan below   No flowsheet data found. CHL IP DIET RECOMMENDATION 12/20/2016 SLP Diet Recommendations Regular solids;Thin liquid Liquid Administration via Straw;Cup Medication Administration Crushed with puree Compensations Slow rate;Small sips/bites;Clear throat intermittently;Multiple dry swallows after each bite/sip Postural Changes Remain semi-upright after after feeds/meals (Comment)   CHL IP OTHER RECOMMENDATIONS 12/20/2016 Recommended Consults -- Oral Care Recommendations Oral care BID Other Recommendations --   CHL IP FOLLOW UP RECOMMENDATIONS 12/20/2016 Follow up Recommendations Outpatient SLP   CHL IP FREQUENCY AND DURATION 12/20/2016 Speech Therapy Frequency (ACUTE ONLY) min 2x/week Treatment Duration 1 week      CHL IP ORAL PHASE 12/20/2016 Oral Phase WFL Oral - Pudding Teaspoon -- Oral - Pudding Cup -- Oral - Honey Teaspoon -- Oral - Honey Cup -- Oral - Nectar Teaspoon -- Oral -  Nectar Cup -- Oral - Nectar Straw -- Oral - Thin Teaspoon -- Oral - Thin Cup -- Oral - Thin Straw -- Oral - Puree -- Oral - Mech Soft -- Oral - Regular -- Oral - Multi-Consistency -- Oral - Pill -- Oral Phase - Comment --  CHL IP PHARYNGEAL PHASE 12/20/2016 Pharyngeal Phase Impaired Pharyngeal- Pudding Teaspoon -- Pharyngeal -- Pharyngeal- Pudding Cup -- Pharyngeal -- Pharyngeal- Honey Teaspoon -- Pharyngeal -- Pharyngeal- Honey Cup -- Pharyngeal -- Pharyngeal- Nectar Teaspoon -- Pharyngeal -- Pharyngeal- Nectar Cup Delayed swallow initiation-pyriform sinuses;Reduced epiglottic inversion;Reduced anterior laryngeal mobility;Reduced airway/laryngeal closure;Reduced tongue base retraction;Penetration/Aspiration before swallow;Trace aspiration;Pharyngeal residue - valleculae;Pharyngeal residue - pyriform;Compensatory strategies attempted (with notebox) Pharyngeal Material enters airway, CONTACTS cords and not ejected out Pharyngeal- Nectar Straw -- Pharyngeal -- Pharyngeal- Thin Teaspoon -- Pharyngeal -- Pharyngeal- Thin Cup Delayed swallow initiation-pyriform sinuses;Reduced epiglottic inversion;Reduced anterior laryngeal mobility;Reduced airway/laryngeal closure;Reduced tongue base retraction;Penetration/Aspiration before swallow;Trace aspiration;Pharyngeal residue - valleculae;Pharyngeal residue - pyriform;Compensatory strategies attempted (with notebox) Pharyngeal Material enters airway, passes BELOW cords and not ejected out despite cough attempt by patient Pharyngeal- Thin Straw -- Pharyngeal -- Pharyngeal- Puree Reduced epiglottic inversion;Reduced anterior laryngeal mobility;Reduced airway/laryngeal closure;Reduced tongue base retraction;Pharyngeal residue - valleculae;Pharyngeal residue - pyriform;Compensatory strategies attempted (with notebox);Delayed swallow initiation-vallecula Pharyngeal -- Pharyngeal- Mechanical Soft -- Pharyngeal -- Pharyngeal- Regular -- Pharyngeal -- Pharyngeal- Multi-consistency --  Pharyngeal -- Pharyngeal- Pill -- Pharyngeal -- Pharyngeal Comment --  No flowsheet data found. No flowsheet data found. Juan Quam [name] 12/20/2016, 1:48 PM                 Subjective: He is feeling well, he is just feeling tired. He report dyspnea and cough has improved.   Discharge Exam: Vitals:   12/23/16 1109 12/23/16 1345  BP: (!) 160/85 102/69  Pulse:  77  Resp:  18  Temp:  98.8 F (37.1 C)  SpO2:  (!) 89%   Vitals:   12/23/16 0510 12/23/16 1109 12/23/16 1300 12/23/16 1345  BP: 130/76 (!) 160/85  102/69  Pulse: 70   77  Resp: 18   18  Temp: 97.7 F (36.5 C)   98.8 F (37.1 C)  TempSrc: Oral   Oral  SpO2: 98%   (!) 89%  Weight:   58.2 kg (128 lb 4.9 oz)   Height:        General: Pt is alert,  awake, not in acute distress Cardiovascular: RRR, S1/S2 +, no rubs, no gallops Respiratory: CTA bilaterally, no wheezing, no rhonchi Abdominal: Soft, NT, ND, bowel sounds + Extremities: no edema, no cyanosis    The results of significant diagnostics from this hospitalization (including imaging, microbiology, ancillary and laboratory) are listed below for reference.     Microbiology: Recent Results (from the past 240 hour(s))  C difficile quick scan w PCR reflex     Status: None   Collection Time: 12/14/16  2:41 PM  Result Value Ref Range Status   C Diff antigen NEGATIVE NEGATIVE Final   C Diff toxin NEGATIVE NEGATIVE Final   C Diff interpretation No C. difficile detected.  Final  Culture, blood (Routine x 2)     Status: None (Preliminary result)   Collection Time: 12/19/16  4:47 AM  Result Value Ref Range Status   Specimen Description BLOOD RIGHT FOREARM  Final   Special Requests   Final    BOTTLES DRAWN AEROBIC AND ANAEROBIC Blood Culture results may not be optimal due to an inadequate volume of blood received in culture bottles   Culture   Final    NO GROWTH 3 DAYS Performed at [facility], [place] [address], [place], [place] [address]    Report  Status PENDING  Incomplete  Culture, blood (Routine x 2)     Status: None (Preliminary result)   Collection Time: 12/19/16  4:47 AM  Result Value Ref Range Status   Specimen Description BLOOD LEFT FOREARM  Final   Special Requests   Final    BOTTLES DRAWN AEROBIC AND ANAEROBIC Blood Culture results may not be optimal due to an inadequate volume of blood received in culture bottles   Culture   Final    NO GROWTH 3 DAYS Performed at [facility], [place] [address], [place], [place] [address]    Report Status PENDING  Incomplete  MRSA PCR Screening     Status: None   Collection Time: 12/19/16  4:24 PM  Result Value Ref Range Status   MRSA by PCR NEGATIVE NEGATIVE Final    Comment:        The GeneXpert MRSA Assay (FDA approved for NASAL specimens only), is one component of a comprehensive MRSA colonization surveillance program. It is not intended to diagnose MRSA infection nor to guide or monitor treatment for MRSA infections.   Culture, sputum-assessment     Status: None   Collection Time: 12/19/16  9:09 PM  Result Value Ref Range Status   Specimen Description EXPECTORATED SPUTUM  Final   Special Requests NONE  Final   Sputum evaluation THIS SPECIMEN IS ACCEPTABLE FOR SPUTUM CULTURE  Final   Report Status 12/19/2016 FINAL  Final  Culture, respiratory (NON-Expectorated)     Status: None   Collection Time: 12/19/16  9:09 PM  Result Value Ref Range Status   Specimen Description EXPECTORATED SPUTUM  Final   Special Requests NONE Reflexed from [id number]  Final   Gram Stain   Final    RARE WBC PRESENT, PREDOMINANTLY PMN RARE GRAM POSITIVE RODS    Culture   Final    Consistent with normal respiratory flora. Performed at [facility], [place] [address] [place], [place] [address]    Report Status 12/22/2016 FINAL  Final     Labs: BNP (last 3 results)  Recent Labs  12/19/16 0504  BNP 79.0   Basic Metabolic Panel:  Recent Labs Lab 12/19/16 0447 12/20/16 0328  12/21/16 0446 12/23/16 1202  NA 136 136 137 134*  K 5.2* 4.3 3.6 3.7  CL 101 103 102 97*  CO2 25 26 28 28   GLUCOSE 308* 190* 162* 268*  BUN 43* 17 14 23*  CREATININE 1.35* 0.64 0.57* 0.61  CALCIUM 9.1 8.3* 8.7* 8.8*   Liver Function Tests:  Recent Labs Lab 12/19/16 0447  AST 31  ALT 25  ALKPHOS 107  BILITOT 0.5  PROT 6.2*  ALBUMIN 2.8*   No results for input(s): LIPASE, AMYLASE in the last 168 hours. No results for input(s): AMMONIA in the last 168 hours. CBC:  Recent Labs Lab 12/19/16 0447 12/20/16 0328 12/21/16 0446 12/22/16 0431  WBC 16.6* 14.1* 12.2* 9.5  NEUTROABS 15.1*  --   --   --   HGB 10.2* 9.4* 9.5* 10.4*  HCT 31.4* 28.1* 28.9* 31.7*  MCV 87.5 88.4 86.5 86.6  PLT 318 301 366 376   Cardiac Enzymes:  Recent Labs Lab 12/19/16 0504  TROPONINI <0.03   BNP: Invalid input(s): POCBNP CBG:  Recent Labs Lab 12/22/16 1215 12/22/16 1724 12/22/16 2030 12/23/16 0821 12/23/16 1148  GLUCAP 222* 232* 207* 145* 285*   D-Dimer No results for input(s): DDIMER in the last 72 hours. Hgb A1c No results for input(s): HGBA1C in the last 72 hours. Lipid Profile No results for input(s): CHOL, HDL, LDLCALC, TRIG, CHOLHDL, LDLDIRECT in the last 72 hours. Thyroid function studies No results for input(s): TSH, T4TOTAL, T3FREE, THYROIDAB in the last 72 hours.  Invalid input(s): FREET3 Anemia work up No results for input(s): VITAMINB12, FOLATE, FERRITIN, TIBC, IRON, RETICCTPCT in the last 72 hours. Urinalysis    Component Value Date/Time   COLORURINE YELLOW 12/19/2016 0822   APPEARANCEUR CLEAR 12/19/2016 0822   LABSPEC 1.015 12/19/2016 0822   PHURINE 5.0 12/19/2016 0822   GLUCOSEU 50 (A) 12/19/2016 0822   HGBUR NEGATIVE 12/19/2016 0822   BILIRUBINUR NEGATIVE 12/19/2016 0822   KETONESUR NEGATIVE 12/19/2016 0822   PROTEINUR NEGATIVE 12/19/2016 0822   UROBILINOGEN 0.2 12/24/2010 1016   NITRITE NEGATIVE 12/19/2016 0822   LEUKOCYTESUR NEGATIVE 12/19/2016  0822   Sepsis Labs Invalid input(s): PROCALCITONIN,  WBC,  LACTICIDVEN Microbiology Recent Results (from the past 240 hour(s))  C difficile quick scan w PCR reflex     Status: None   Collection Time: 12/14/16  2:41 PM  Result Value Ref Range Status   C Diff antigen NEGATIVE NEGATIVE Final   C Diff toxin NEGATIVE NEGATIVE Final   C Diff interpretation No C. difficile detected.  Final  Culture, blood (Routine x 2)     Status: None (Preliminary result)   Collection Time: 12/19/16  4:47 AM  Result Value Ref Range Status   Specimen Description BLOOD RIGHT FOREARM  Final   Special Requests   Final    BOTTLES DRAWN AEROBIC AND ANAEROBIC Blood Culture results may not be optimal due to an inadequate volume of blood received in culture bottles   Culture   Final    NO GROWTH 3 DAYS Performed at [facility], [place] [address] [place], [place] [address]    Report Status PENDING  Incomplete  Culture, blood (Routine x 2)     Status: None (Preliminary result)   Collection Time: 12/19/16  4:47 AM  Result Value Ref Range Status   Specimen Description BLOOD LEFT FOREARM  Final   Special Requests   Final    BOTTLES DRAWN AEROBIC AND ANAEROBIC Blood Culture results may not be optimal due to  an inadequate volume of blood received in culture bottles   Culture   Final    NO GROWTH 3 DAYS Performed at [facility], [place] [address], [place], [place] [address]    Report Status PENDING  Incomplete  MRSA PCR Screening     Status: None   Collection Time: 12/19/16  4:24 PM  Result Value Ref Range Status   MRSA by PCR NEGATIVE NEGATIVE Final    Comment:        The GeneXpert MRSA Assay (FDA approved for NASAL specimens only), is one component of a comprehensive MRSA colonization surveillance program. It is not intended to diagnose MRSA infection nor to guide or monitor treatment for MRSA infections.   Culture, sputum-assessment     Status: None   Collection Time: 12/19/16  9:09 PM  Result  Value Ref Range Status   Specimen Description EXPECTORATED SPUTUM  Final   Special Requests NONE  Final   Sputum evaluation THIS SPECIMEN IS ACCEPTABLE FOR SPUTUM CULTURE  Final   Report Status 12/19/2016 FINAL  Final  Culture, respiratory (NON-Expectorated)     Status: None   Collection Time: 12/19/16  9:09 PM  Result Value Ref Range Status   Specimen Description EXPECTORATED SPUTUM  Final   Special Requests NONE Reflexed from [id number]  Final   Gram Stain   Final    RARE WBC PRESENT, PREDOMINANTLY PMN RARE GRAM POSITIVE RODS    Culture   Final    Consistent with normal respiratory flora. Performed at [facility], [place] [address], [place], [place] [address]    Report Status 12/22/2016 FINAL  Final     Time coordinating discharge: Over 30 minutes  SIGNED:   Elmarie Shiley, MD  [facility] 12/23/2016, 2:52 PM Pager [phone]  If 7PM-7AM, please contact night-coverage [email or web address] Password [email or web address]

## 2016-12-23 NOTE — Progress Notes (Signed)
PHYSICAL THERAPY  RN asked if I could see pt "soon" to amb and check RA prior to D/C.  Pt was in bed and spouse in recliner.  Spouse insisted pt have his lunch before he walk.  Will return later as schedule permits.  Felecia Shelling  PTA [facility]  Acute  Rehab Pager      [phone]

## 2016-12-23 NOTE — Progress Notes (Signed)
  Speech Language Pathology Treatment: Dysphagia  Patient Details Name: Johnny Patrick MRN: [id number] DOB: [birth year] Today's Date: 12/23/2016 Time: 2297-9892 SLP Time Calculation (min) (ACUTE ONLY): 44 min  Assessment / Plan / Recommendation Clinical Impression  Dysphagia session focused on review of exercises to mitigate risk/dysphagia and strengthen swallow with pt needing min cues to recall at end of session.   Importance of "hock" and expectoration reviewed to clear vallecular residuals - noted on MBS study.  Use of visual cues *review of MBS study* to solidify comprehension of strategies and exercises helpful.    Pt admits to coughing ONLY in the mornings after breakfast = however he did not recall coughing during MBS.  Per MBS report pt did aspirate with cough response - causing SLP to suspect some low grade chronic aspiration with decreased awareness due to chronicity.  Baseline chronic deficits are likely exacerbated at this time with current illness.   MD please order follow up SLP as OP for dysphagia management.    HPI HPI: 71 year old man PMH psoriatic arthritis on chronic prednisone, recently hospitalized for acute hypoxic respiratory failure secondary to multilobar pneumonia, sepsis, presented to the emergency department with generalized weakness, poor oral intake and confusion. He was placed on nonrebreather mask. Admitted for acute hypoxic respiratory failure, acute kidney injury, suspected new left lobar pneumonia, concerning for aspiration.  Pt with remote hx of dysphagia 2009 - after long term intubation/medical illness - He did require a feeding tube at that time and went to our CIR department.  He describes s/s of esophageal reflux, reporting "fluid coming up" at night when lying down.        SLP Plan  Continue with current plan of care       Recommendations  Diet recommendations: Regular;Thin liquid Liquids provided via: Cup;Straw Medication Administration:  (as  tolerated) Supervision: Patient able to self feed Compensations: Slow rate;Small sips/bites;Clear throat intermittently;Chin tuck;Multiple dry swallows after each bite/sip (chin tuck with liquids, "hock" and expectorate prn) Postural Changes and/or Swallow Maneuvers: Seated upright 90 degrees;Upright 30-60 min after meal                Oral Care Recommendations: Oral care BID Follow up Recommendations: Home health SLP;Outpatient SLP (vs) Plan: Continue with current plan of care       GO               Donavan Burnet, MS [facility] SLP [phone]  Chales Abrahams 12/23/2016, 2:34 PM

## 2016-12-23 NOTE — Care Management Note (Signed)
Case Management Note  Patient Details  Name: Johnny Patrick MRN: [id number] Date of Birth: 1945/06/12  Subjective/Objective:                    Action/Plan:   Expected Discharge Date:  12/23/16               Expected Discharge Plan:  Home w Home Health Services  In-House Referral:     Discharge planning Services  CM Consult  Post Acute Care Choice:    Choice offered to:  Patient  DME Arranged:    DME Agency:     HH Arranged:  Patient Refused HH, RN, PT, NA, Speech Therapy (pt declined) HH Agency:  [facility]  Status of Service:  Completed, signed off  If discussed at Long Length of Stay Meetings, dates discussed:    Additional CommentsGeni Bers, RN 12/23/2016, 2:43 PM

## 2016-12-24 DIAGNOSIS — D638 Anemia in other chronic diseases classified elsewhere: Secondary | ICD-10-CM | POA: Diagnosis not present

## 2016-12-24 DIAGNOSIS — J440 Chronic obstructive pulmonary disease with acute lower respiratory infection: Secondary | ICD-10-CM | POA: Diagnosis not present

## 2016-12-24 DIAGNOSIS — E1165 Type 2 diabetes mellitus with hyperglycemia: Secondary | ICD-10-CM | POA: Diagnosis not present

## 2016-12-24 DIAGNOSIS — R1313 Dysphagia, pharyngeal phase: Secondary | ICD-10-CM | POA: Diagnosis not present

## 2016-12-24 DIAGNOSIS — Z87891 Personal history of nicotine dependence: Secondary | ICD-10-CM | POA: Diagnosis not present

## 2016-12-24 DIAGNOSIS — J181 Lobar pneumonia, unspecified organism: Secondary | ICD-10-CM | POA: Diagnosis not present

## 2016-12-24 DIAGNOSIS — L4052 Psoriatic arthritis mutilans: Secondary | ICD-10-CM | POA: Diagnosis not present

## 2016-12-24 DIAGNOSIS — Z89512 Acquired absence of left leg below knee: Secondary | ICD-10-CM | POA: Diagnosis not present

## 2016-12-24 DIAGNOSIS — Z7952 Long term (current) use of systemic steroids: Secondary | ICD-10-CM | POA: Diagnosis not present

## 2016-12-24 DIAGNOSIS — I10 Essential (primary) hypertension: Secondary | ICD-10-CM | POA: Diagnosis not present

## 2016-12-24 DIAGNOSIS — Z7984 Long term (current) use of oral hypoglycemic drugs: Secondary | ICD-10-CM | POA: Diagnosis not present

## 2016-12-24 LAB — CULTURE, BLOOD (ROUTINE X 2)
CULTURE: NO GROWTH
CULTURE: NO GROWTH

## 2016-12-25 DIAGNOSIS — E039 Hypothyroidism, unspecified: Secondary | ICD-10-CM | POA: Diagnosis not present

## 2016-12-25 DIAGNOSIS — Z125 Encounter for screening for malignant neoplasm of prostate: Secondary | ICD-10-CM | POA: Diagnosis not present

## 2016-12-25 DIAGNOSIS — R1313 Dysphagia, pharyngeal phase: Secondary | ICD-10-CM | POA: Diagnosis not present

## 2016-12-25 DIAGNOSIS — Z0000 Encounter for general adult medical examination without abnormal findings: Secondary | ICD-10-CM | POA: Diagnosis not present

## 2016-12-25 DIAGNOSIS — L4052 Psoriatic arthritis mutilans: Secondary | ICD-10-CM | POA: Diagnosis not present

## 2016-12-25 DIAGNOSIS — D638 Anemia in other chronic diseases classified elsewhere: Secondary | ICD-10-CM | POA: Diagnosis not present

## 2016-12-25 DIAGNOSIS — E1165 Type 2 diabetes mellitus with hyperglycemia: Secondary | ICD-10-CM | POA: Diagnosis not present

## 2016-12-25 DIAGNOSIS — J181 Lobar pneumonia, unspecified organism: Secondary | ICD-10-CM | POA: Diagnosis not present

## 2016-12-25 DIAGNOSIS — I10 Essential (primary) hypertension: Secondary | ICD-10-CM | POA: Diagnosis not present

## 2016-12-25 DIAGNOSIS — E119 Type 2 diabetes mellitus without complications: Secondary | ICD-10-CM | POA: Diagnosis not present

## 2016-12-25 DIAGNOSIS — J440 Chronic obstructive pulmonary disease with acute lower respiratory infection: Secondary | ICD-10-CM | POA: Diagnosis not present

## 2016-12-26 DIAGNOSIS — J181 Lobar pneumonia, unspecified organism: Secondary | ICD-10-CM | POA: Diagnosis not present

## 2016-12-26 DIAGNOSIS — R1313 Dysphagia, pharyngeal phase: Secondary | ICD-10-CM | POA: Diagnosis not present

## 2016-12-26 DIAGNOSIS — J440 Chronic obstructive pulmonary disease with acute lower respiratory infection: Secondary | ICD-10-CM | POA: Diagnosis not present

## 2016-12-26 DIAGNOSIS — D638 Anemia in other chronic diseases classified elsewhere: Secondary | ICD-10-CM | POA: Diagnosis not present

## 2016-12-26 DIAGNOSIS — E1165 Type 2 diabetes mellitus with hyperglycemia: Secondary | ICD-10-CM | POA: Diagnosis not present

## 2016-12-26 DIAGNOSIS — L4052 Psoriatic arthritis mutilans: Secondary | ICD-10-CM | POA: Diagnosis not present

## 2016-12-30 DIAGNOSIS — J440 Chronic obstructive pulmonary disease with acute lower respiratory infection: Secondary | ICD-10-CM | POA: Diagnosis not present

## 2016-12-30 DIAGNOSIS — J181 Lobar pneumonia, unspecified organism: Secondary | ICD-10-CM | POA: Diagnosis not present

## 2016-12-30 DIAGNOSIS — D638 Anemia in other chronic diseases classified elsewhere: Secondary | ICD-10-CM | POA: Diagnosis not present

## 2016-12-30 DIAGNOSIS — L4052 Psoriatic arthritis mutilans: Secondary | ICD-10-CM | POA: Diagnosis not present

## 2016-12-30 DIAGNOSIS — R1313 Dysphagia, pharyngeal phase: Secondary | ICD-10-CM | POA: Diagnosis not present

## 2016-12-30 DIAGNOSIS — E1165 Type 2 diabetes mellitus with hyperglycemia: Secondary | ICD-10-CM | POA: Diagnosis not present

## 2016-12-31 DIAGNOSIS — L4052 Psoriatic arthritis mutilans: Secondary | ICD-10-CM | POA: Diagnosis not present

## 2016-12-31 DIAGNOSIS — E1165 Type 2 diabetes mellitus with hyperglycemia: Secondary | ICD-10-CM | POA: Diagnosis not present

## 2016-12-31 DIAGNOSIS — R1313 Dysphagia, pharyngeal phase: Secondary | ICD-10-CM | POA: Diagnosis not present

## 2016-12-31 DIAGNOSIS — J181 Lobar pneumonia, unspecified organism: Secondary | ICD-10-CM | POA: Diagnosis not present

## 2016-12-31 DIAGNOSIS — D638 Anemia in other chronic diseases classified elsewhere: Secondary | ICD-10-CM | POA: Diagnosis not present

## 2016-12-31 DIAGNOSIS — J440 Chronic obstructive pulmonary disease with acute lower respiratory infection: Secondary | ICD-10-CM | POA: Diagnosis not present

## 2017-01-01 DIAGNOSIS — K219 Gastro-esophageal reflux disease without esophagitis: Secondary | ICD-10-CM | POA: Diagnosis not present

## 2017-01-01 DIAGNOSIS — E119 Type 2 diabetes mellitus without complications: Secondary | ICD-10-CM | POA: Diagnosis not present

## 2017-01-01 DIAGNOSIS — M353 Polymyalgia rheumatica: Secondary | ICD-10-CM | POA: Diagnosis not present

## 2017-01-01 DIAGNOSIS — G4733 Obstructive sleep apnea (adult) (pediatric): Secondary | ICD-10-CM | POA: Diagnosis not present

## 2017-01-01 DIAGNOSIS — R600 Localized edema: Secondary | ICD-10-CM | POA: Diagnosis not present

## 2017-01-01 DIAGNOSIS — M150 Primary generalized (osteo)arthritis: Secondary | ICD-10-CM | POA: Diagnosis not present

## 2017-01-01 DIAGNOSIS — I10 Essential (primary) hypertension: Secondary | ICD-10-CM | POA: Diagnosis not present

## 2017-01-01 DIAGNOSIS — E782 Mixed hyperlipidemia: Secondary | ICD-10-CM | POA: Diagnosis not present

## 2017-01-01 DIAGNOSIS — L4059 Other psoriatic arthropathy: Secondary | ICD-10-CM | POA: Diagnosis not present

## 2017-01-01 DIAGNOSIS — J181 Lobar pneumonia, unspecified organism: Secondary | ICD-10-CM | POA: Diagnosis not present

## 2017-01-01 DIAGNOSIS — E1165 Type 2 diabetes mellitus with hyperglycemia: Secondary | ICD-10-CM | POA: Diagnosis not present

## 2017-01-01 DIAGNOSIS — L4052 Psoriatic arthritis mutilans: Secondary | ICD-10-CM | POA: Diagnosis not present

## 2017-01-01 DIAGNOSIS — J440 Chronic obstructive pulmonary disease with acute lower respiratory infection: Secondary | ICD-10-CM | POA: Diagnosis not present

## 2017-01-01 DIAGNOSIS — E039 Hypothyroidism, unspecified: Secondary | ICD-10-CM | POA: Diagnosis not present

## 2017-01-01 DIAGNOSIS — R1313 Dysphagia, pharyngeal phase: Secondary | ICD-10-CM | POA: Diagnosis not present

## 2017-01-01 DIAGNOSIS — D638 Anemia in other chronic diseases classified elsewhere: Secondary | ICD-10-CM | POA: Diagnosis not present

## 2017-01-01 DIAGNOSIS — J449 Chronic obstructive pulmonary disease, unspecified: Secondary | ICD-10-CM | POA: Diagnosis not present

## 2017-01-02 DIAGNOSIS — J440 Chronic obstructive pulmonary disease with acute lower respiratory infection: Secondary | ICD-10-CM | POA: Diagnosis not present

## 2017-01-02 DIAGNOSIS — R1313 Dysphagia, pharyngeal phase: Secondary | ICD-10-CM | POA: Diagnosis not present

## 2017-01-02 DIAGNOSIS — E1165 Type 2 diabetes mellitus with hyperglycemia: Secondary | ICD-10-CM | POA: Diagnosis not present

## 2017-01-02 DIAGNOSIS — L4052 Psoriatic arthritis mutilans: Secondary | ICD-10-CM | POA: Diagnosis not present

## 2017-01-02 DIAGNOSIS — D638 Anemia in other chronic diseases classified elsewhere: Secondary | ICD-10-CM | POA: Diagnosis not present

## 2017-01-02 DIAGNOSIS — J181 Lobar pneumonia, unspecified organism: Secondary | ICD-10-CM | POA: Diagnosis not present

## 2017-01-07 DIAGNOSIS — D638 Anemia in other chronic diseases classified elsewhere: Secondary | ICD-10-CM | POA: Diagnosis not present

## 2017-01-07 DIAGNOSIS — L4052 Psoriatic arthritis mutilans: Secondary | ICD-10-CM | POA: Diagnosis not present

## 2017-01-07 DIAGNOSIS — J440 Chronic obstructive pulmonary disease with acute lower respiratory infection: Secondary | ICD-10-CM | POA: Diagnosis not present

## 2017-01-07 DIAGNOSIS — E1165 Type 2 diabetes mellitus with hyperglycemia: Secondary | ICD-10-CM | POA: Diagnosis not present

## 2017-01-07 DIAGNOSIS — R1313 Dysphagia, pharyngeal phase: Secondary | ICD-10-CM | POA: Diagnosis not present

## 2017-01-07 DIAGNOSIS — J181 Lobar pneumonia, unspecified organism: Secondary | ICD-10-CM | POA: Diagnosis not present

## 2017-01-08 DIAGNOSIS — E1165 Type 2 diabetes mellitus with hyperglycemia: Secondary | ICD-10-CM | POA: Diagnosis not present

## 2017-01-08 DIAGNOSIS — J181 Lobar pneumonia, unspecified organism: Secondary | ICD-10-CM | POA: Diagnosis not present

## 2017-01-08 DIAGNOSIS — R1313 Dysphagia, pharyngeal phase: Secondary | ICD-10-CM | POA: Diagnosis not present

## 2017-01-08 DIAGNOSIS — L4052 Psoriatic arthritis mutilans: Secondary | ICD-10-CM | POA: Diagnosis not present

## 2017-01-08 DIAGNOSIS — J440 Chronic obstructive pulmonary disease with acute lower respiratory infection: Secondary | ICD-10-CM | POA: Diagnosis not present

## 2017-01-08 DIAGNOSIS — D638 Anemia in other chronic diseases classified elsewhere: Secondary | ICD-10-CM | POA: Diagnosis not present

## 2017-01-09 DIAGNOSIS — D638 Anemia in other chronic diseases classified elsewhere: Secondary | ICD-10-CM | POA: Diagnosis not present

## 2017-01-09 DIAGNOSIS — R1313 Dysphagia, pharyngeal phase: Secondary | ICD-10-CM | POA: Diagnosis not present

## 2017-01-09 DIAGNOSIS — Z6822 Body mass index (BMI) 22.0-22.9, adult: Secondary | ICD-10-CM | POA: Diagnosis not present

## 2017-01-09 DIAGNOSIS — J440 Chronic obstructive pulmonary disease with acute lower respiratory infection: Secondary | ICD-10-CM | POA: Diagnosis not present

## 2017-01-09 DIAGNOSIS — J181 Lobar pneumonia, unspecified organism: Secondary | ICD-10-CM | POA: Diagnosis not present

## 2017-01-09 DIAGNOSIS — E1165 Type 2 diabetes mellitus with hyperglycemia: Secondary | ICD-10-CM | POA: Diagnosis not present

## 2017-01-09 DIAGNOSIS — L4052 Psoriatic arthritis mutilans: Secondary | ICD-10-CM | POA: Diagnosis not present

## 2017-01-09 DIAGNOSIS — M4712 Other spondylosis with myelopathy, cervical region: Secondary | ICD-10-CM | POA: Diagnosis not present

## 2017-01-10 DIAGNOSIS — J440 Chronic obstructive pulmonary disease with acute lower respiratory infection: Secondary | ICD-10-CM | POA: Diagnosis not present

## 2017-01-10 DIAGNOSIS — R1313 Dysphagia, pharyngeal phase: Secondary | ICD-10-CM | POA: Diagnosis not present

## 2017-01-10 DIAGNOSIS — D638 Anemia in other chronic diseases classified elsewhere: Secondary | ICD-10-CM | POA: Diagnosis not present

## 2017-01-10 DIAGNOSIS — E1165 Type 2 diabetes mellitus with hyperglycemia: Secondary | ICD-10-CM | POA: Diagnosis not present

## 2017-01-10 DIAGNOSIS — J181 Lobar pneumonia, unspecified organism: Secondary | ICD-10-CM | POA: Diagnosis not present

## 2017-01-10 DIAGNOSIS — L4052 Psoriatic arthritis mutilans: Secondary | ICD-10-CM | POA: Diagnosis not present

## 2017-01-14 DIAGNOSIS — E1165 Type 2 diabetes mellitus with hyperglycemia: Secondary | ICD-10-CM | POA: Diagnosis not present

## 2017-01-14 DIAGNOSIS — R1313 Dysphagia, pharyngeal phase: Secondary | ICD-10-CM | POA: Diagnosis not present

## 2017-01-14 DIAGNOSIS — L4052 Psoriatic arthritis mutilans: Secondary | ICD-10-CM | POA: Diagnosis not present

## 2017-01-14 DIAGNOSIS — D638 Anemia in other chronic diseases classified elsewhere: Secondary | ICD-10-CM | POA: Diagnosis not present

## 2017-01-14 DIAGNOSIS — J181 Lobar pneumonia, unspecified organism: Secondary | ICD-10-CM | POA: Diagnosis not present

## 2017-01-14 DIAGNOSIS — J440 Chronic obstructive pulmonary disease with acute lower respiratory infection: Secondary | ICD-10-CM | POA: Diagnosis not present

## 2017-01-15 DIAGNOSIS — J181 Lobar pneumonia, unspecified organism: Secondary | ICD-10-CM | POA: Diagnosis not present

## 2017-01-15 DIAGNOSIS — I10 Essential (primary) hypertension: Secondary | ICD-10-CM | POA: Diagnosis not present

## 2017-01-15 DIAGNOSIS — L4052 Psoriatic arthritis mutilans: Secondary | ICD-10-CM | POA: Diagnosis not present

## 2017-01-15 DIAGNOSIS — R1313 Dysphagia, pharyngeal phase: Secondary | ICD-10-CM | POA: Diagnosis not present

## 2017-01-15 DIAGNOSIS — J440 Chronic obstructive pulmonary disease with acute lower respiratory infection: Secondary | ICD-10-CM | POA: Diagnosis not present

## 2017-01-16 DIAGNOSIS — J181 Lobar pneumonia, unspecified organism: Secondary | ICD-10-CM | POA: Diagnosis not present

## 2017-01-16 DIAGNOSIS — D638 Anemia in other chronic diseases classified elsewhere: Secondary | ICD-10-CM | POA: Diagnosis not present

## 2017-01-16 DIAGNOSIS — E1165 Type 2 diabetes mellitus with hyperglycemia: Secondary | ICD-10-CM | POA: Diagnosis not present

## 2017-01-16 DIAGNOSIS — R1313 Dysphagia, pharyngeal phase: Secondary | ICD-10-CM | POA: Diagnosis not present

## 2017-01-16 DIAGNOSIS — L4052 Psoriatic arthritis mutilans: Secondary | ICD-10-CM | POA: Diagnosis not present

## 2017-01-16 DIAGNOSIS — J440 Chronic obstructive pulmonary disease with acute lower respiratory infection: Secondary | ICD-10-CM | POA: Diagnosis not present

## 2017-01-20 DIAGNOSIS — E1165 Type 2 diabetes mellitus with hyperglycemia: Secondary | ICD-10-CM | POA: Diagnosis not present

## 2017-01-20 DIAGNOSIS — J181 Lobar pneumonia, unspecified organism: Secondary | ICD-10-CM | POA: Diagnosis not present

## 2017-01-20 DIAGNOSIS — D638 Anemia in other chronic diseases classified elsewhere: Secondary | ICD-10-CM | POA: Diagnosis not present

## 2017-01-20 DIAGNOSIS — J440 Chronic obstructive pulmonary disease with acute lower respiratory infection: Secondary | ICD-10-CM | POA: Diagnosis not present

## 2017-01-20 DIAGNOSIS — L4052 Psoriatic arthritis mutilans: Secondary | ICD-10-CM | POA: Diagnosis not present

## 2017-01-20 DIAGNOSIS — R1313 Dysphagia, pharyngeal phase: Secondary | ICD-10-CM | POA: Diagnosis not present

## 2017-01-21 DIAGNOSIS — J440 Chronic obstructive pulmonary disease with acute lower respiratory infection: Secondary | ICD-10-CM | POA: Diagnosis not present

## 2017-01-21 DIAGNOSIS — D638 Anemia in other chronic diseases classified elsewhere: Secondary | ICD-10-CM | POA: Diagnosis not present

## 2017-01-21 DIAGNOSIS — J181 Lobar pneumonia, unspecified organism: Secondary | ICD-10-CM | POA: Diagnosis not present

## 2017-01-21 DIAGNOSIS — R1313 Dysphagia, pharyngeal phase: Secondary | ICD-10-CM | POA: Diagnosis not present

## 2017-01-21 DIAGNOSIS — L4052 Psoriatic arthritis mutilans: Secondary | ICD-10-CM | POA: Diagnosis not present

## 2017-01-21 DIAGNOSIS — E1165 Type 2 diabetes mellitus with hyperglycemia: Secondary | ICD-10-CM | POA: Diagnosis not present

## 2017-01-22 DIAGNOSIS — E1165 Type 2 diabetes mellitus with hyperglycemia: Secondary | ICD-10-CM | POA: Diagnosis not present

## 2017-01-22 DIAGNOSIS — J440 Chronic obstructive pulmonary disease with acute lower respiratory infection: Secondary | ICD-10-CM | POA: Diagnosis not present

## 2017-01-22 DIAGNOSIS — J181 Lobar pneumonia, unspecified organism: Secondary | ICD-10-CM | POA: Diagnosis not present

## 2017-01-22 DIAGNOSIS — D638 Anemia in other chronic diseases classified elsewhere: Secondary | ICD-10-CM | POA: Diagnosis not present

## 2017-01-22 DIAGNOSIS — R1313 Dysphagia, pharyngeal phase: Secondary | ICD-10-CM | POA: Diagnosis not present

## 2017-01-22 DIAGNOSIS — L4052 Psoriatic arthritis mutilans: Secondary | ICD-10-CM | POA: Diagnosis not present

## 2017-01-23 DIAGNOSIS — Z6822 Body mass index (BMI) 22.0-22.9, adult: Secondary | ICD-10-CM | POA: Diagnosis not present

## 2017-01-23 DIAGNOSIS — J440 Chronic obstructive pulmonary disease with acute lower respiratory infection: Secondary | ICD-10-CM | POA: Diagnosis not present

## 2017-01-23 DIAGNOSIS — E1165 Type 2 diabetes mellitus with hyperglycemia: Secondary | ICD-10-CM | POA: Diagnosis not present

## 2017-01-23 DIAGNOSIS — J181 Lobar pneumonia, unspecified organism: Secondary | ICD-10-CM | POA: Diagnosis not present

## 2017-01-23 DIAGNOSIS — R1313 Dysphagia, pharyngeal phase: Secondary | ICD-10-CM | POA: Diagnosis not present

## 2017-01-23 DIAGNOSIS — D638 Anemia in other chronic diseases classified elsewhere: Secondary | ICD-10-CM | POA: Diagnosis not present

## 2017-01-23 DIAGNOSIS — L4052 Psoriatic arthritis mutilans: Secondary | ICD-10-CM | POA: Diagnosis not present

## 2017-01-23 DIAGNOSIS — M47816 Spondylosis without myelopathy or radiculopathy, lumbar region: Secondary | ICD-10-CM | POA: Diagnosis not present

## 2017-01-29 ENCOUNTER — Encounter (INDEPENDENT_AMBULATORY_CARE_PROVIDER_SITE_OTHER): Payer: Self-pay | Admitting: Orthopaedic Surgery

## 2017-01-29 ENCOUNTER — Ambulatory Visit (INDEPENDENT_AMBULATORY_CARE_PROVIDER_SITE_OTHER): Payer: Medicare Other | Admitting: Orthopaedic Surgery

## 2017-01-29 ENCOUNTER — Ambulatory Visit (INDEPENDENT_AMBULATORY_CARE_PROVIDER_SITE_OTHER): Payer: Medicare Other

## 2017-01-29 VITALS — BP 155/72 | HR 73 | Resp 14 | Ht 68.0 in | Wt 140.0 lb

## 2017-01-29 DIAGNOSIS — S7002XA Contusion of left hip, initial encounter: Secondary | ICD-10-CM

## 2017-01-29 DIAGNOSIS — M7062 Trochanteric bursitis, left hip: Secondary | ICD-10-CM

## 2017-01-29 NOTE — Progress Notes (Signed)
Office Visit Note   Patient: Johnny Patrick           Date of Birth: 07/04/1945           MRN: [id number] Visit Date: 01/29/2017              Requested by: Georgianne Fick, MD [address] [place], [place] [address] PCP: Georgianne Fick, MD   Assessment & Plan: Visit Diagnoses:  1. Contusion of left hip region     Plan: #1: We'll stay with conservative treatment at this time. If his symptoms worsen he'll return for reevaluation.  Follow-Up Instructions: No Follow-up on file.   Orders:  No orders of the defined types were placed in this encounter.  No orders of the defined types were placed in this encounter.     Procedures: No procedures performed   Clinical Data: No additional findings.   Subjective: Chief Complaint  Patient presents with  . Left Hip - Pain    [name] is a 71 y o that presents with Left lateral hip pain. He fell 10/6 hit against a wall  In the power outage. He pivoted 10/20 and felt a pop. Hx of Left and right THA 2009.    HPI  But is very pleasant 71 year old white male who is seen today for evaluation of his left hip. He states him on October 6 he was walking down 1 step and he started to fall he hit his left lateral hip on a brick wall as well as his head. He states that he really did not have much in the way of pain at that time. He was doing well. However on October 20 he had pivoted and felt a pop in the left hip along the lateral aspect. He started having more pain and discomfort. Never now most recently though he is stating that his symptoms are getting less. 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 Again he states it is improving.   Review of Systems  Constitutional: Negative for fatigue.  HENT: Positive for tinnitus. Negative for hearing loss.   Respiratory: Positive for apnea. Negative for chest tightness and shortness of breath.   Cardiovascular: Negative for chest pain,  palpitations and leg swelling.  Gastrointestinal: Negative for blood in stool, constipation and diarrhea.  Genitourinary: Negative for difficulty urinating.  Musculoskeletal: Positive for arthralgias, back pain, neck pain and neck stiffness. Negative for joint swelling and myalgias.  Neurological: Negative for weakness, numbness and headaches.  Hematological: Does not bruise/bleed easily.  Psychiatric/Behavioral: Negative for sleep disturbance. The patient is not nervous/anxious.      Objective: Vital Signs: BP (!) 155/72   Pulse 73   Resp 14   Ht 5\' 8"  (1.727 m)   Wt 140 lb (63.5 kg)   BMI 21.29 kg/m   Physical Exam  Constitutional: He is oriented to person, place, and time. He appears well-developed and well-nourished.  HENT:  Head: Normocephalic and atraumatic.  Eyes: Pupils are equal, round, and reactive to light. EOM are normal.  Pulmonary/Chest: Effort normal.  Neurological: He is alert and oriented to person, place, and time.  Skin: Skin is warm and dry.  Psychiatric: He has a normal mood and affect. His behavior is normal. Judgment and thought content normal.    Ortho Exam  Today he has minimal if any pain along the lateral aspect of his hip. This is more about the greater trochanteric region more at the insertion  of the musculature. Does not have any ecchymosis or swelling at this area. Is actually got fairly good internal and external rotation. When I do a little bit of external rotation does have little bit of pain at the greater trochanteric region. He  does not have any groin pain at all with any motion of the hip. He is ambulating with a cane.  Specialty Comments:  No specialty comments available.  Imaging: No results found.   PMFS History: Patient Active Problem List   Diagnosis Date Noted  . DM type 2 (diabetes mellitus, type 2) (HCC) 12/19/2016  . AKI (acute kidney injury) (HCC) 12/19/2016  . Lobar pneumonia (HCC) 12/19/2016  . Community acquired  pneumonia 12/16/2016  . Uncontrolled hypertension 12/16/2016  . Hypokalemia 12/16/2016  . Acute metabolic encephalopathy 12/13/2016  . Acute respiratory failure with hypoxia (HCC)   . Multifocal pneumonia   . 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 Diabetic foot ulcer (HCC) 09/18/2016  . Preoperative clearance 09/09/2012  . Arthritis, septic, ankle or foot (HCC) 08/19/2012  . Left ankle swelling 01/22/2012  . Anemia, chronic disease 10/14/2011  . Rheumatoid arthritis (HCC) 10/14/2011  . Psoriatic arthritis (HCC) 10/14/2011  . Confusion caused by a drug (HCC) 06/20/2011  . HTN (hypertension) 05/09/2011  . Leg edema 04/24/2011  . Cellulitis, leg 04/24/2011  . Cellulitis and abscess of leg 04/24/2011  . Immunosuppression (HCC) 04/24/2011  . Insomnia, chronic 02/27/2011  . CELLULITIS, HAND, RIGHT 05/23/2010  . 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 LUNG NODULE 07/18/2009  . ABDOMINAL/PELVIC SWELLING MASS/LUMP UNSPEC SITE 07/18/2009  . HYPERLIPIDEMIA 06/30/2009  . DEGENERATIVE DISC DISEASE 06/30/2009  . HIP REPLACEMENT, LEFT, HX OF 06/30/2009  . OTHER POSTSURGICAL STATUS OTHER 06/30/2009  . FEVER UNSPECIFIED 06/14/2009  . ALLERGIC RHINITIS 08/02/2007  . CRYPTOCOCCOSIS 12/15/2006  . HYPOTHYROIDISM 12/15/2006  . Obstructive sleep apnea 12/15/2006  . HYPERTENSION 12/15/2006  . 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 GERD 12/15/2006  . PSORIATIC ARTHRITIS 12/15/2006  . OSTEOARTHRITIS 12/15/2006  . LOW BACK PAIN 12/15/2006  . Edema 12/15/2006  . NEPHROLITHIASIS, HX OF 12/15/2006  . Personal history of tobacco use, presenting hazards to health 12/15/2006   Past Medical History:  Diagnosis Date  . Anemia   . Anemia, chronic disease 10/14/2011  . 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 Chronic airway obstruction, not elsewhere classified   . Cryptococcus (HCC) 2008   left arm  . Diabetes (HCC)   . Diabetic foot ulcer (HCC) 09/18/2016  . Esophageal reflux   .  Hypothyroidism   . Low back pain   . MRSA (methicillin resistant Staphylococcus aureus)   . MSSA (methicillin susceptible Staphylococcus aureus) septicemia (HCC)    L 2nd toe-amputated June 2011  . Nephrolithiasis   . Osteoarthritis   . Psoriatic arthritis (HCC)   . Psoriatic arthritis (HCC) 10/14/2011  . Shingles    x2  . Sleep apnea    cpap  . Unspecified essential hypertension     Family History  Problem Relation Age of Onset  . Coronary artery disease Mother   . Pneumonia Mother   . Heart failure Mother   . Breast cancer Mother   . Psoriasis Father   . Coronary artery disease Brother   . Colon cancer Neg Hx   . Stomach cancer Neg Hx     Past Surgical History:  Procedure Laterality Date  . BACK SURGERY     3 fusions, 4th - undo 3rd fusion  . THORACIC SPINE SURGERY    .  TOE AMPUTATION    . TOE SURGERY  2009   little toe on right foot , bone removed  . TOTAL HIP ARTHROPLASTY     bilateral   Social History   Occupational History  . retired Retired   Social History Main Topics  . Smoking status: Former Smoker    Types: Cigarettes    Quit date: 04/24/1991  . Smokeless tobacco: Never Used  . Alcohol use No  . Drug use: No  . Sexual activity: Not on file

## 2017-01-30 DIAGNOSIS — E1165 Type 2 diabetes mellitus with hyperglycemia: Secondary | ICD-10-CM | POA: Diagnosis not present

## 2017-01-30 DIAGNOSIS — J181 Lobar pneumonia, unspecified organism: Secondary | ICD-10-CM | POA: Diagnosis not present

## 2017-01-30 DIAGNOSIS — L4052 Psoriatic arthritis mutilans: Secondary | ICD-10-CM | POA: Diagnosis not present

## 2017-01-30 DIAGNOSIS — J440 Chronic obstructive pulmonary disease with acute lower respiratory infection: Secondary | ICD-10-CM | POA: Diagnosis not present

## 2017-01-30 DIAGNOSIS — D638 Anemia in other chronic diseases classified elsewhere: Secondary | ICD-10-CM | POA: Diagnosis not present

## 2017-01-30 DIAGNOSIS — R1313 Dysphagia, pharyngeal phase: Secondary | ICD-10-CM | POA: Diagnosis not present

## 2017-01-31 DIAGNOSIS — D638 Anemia in other chronic diseases classified elsewhere: Secondary | ICD-10-CM | POA: Diagnosis not present

## 2017-01-31 DIAGNOSIS — E1165 Type 2 diabetes mellitus with hyperglycemia: Secondary | ICD-10-CM | POA: Diagnosis not present

## 2017-01-31 DIAGNOSIS — J181 Lobar pneumonia, unspecified organism: Secondary | ICD-10-CM | POA: Diagnosis not present

## 2017-01-31 DIAGNOSIS — L4052 Psoriatic arthritis mutilans: Secondary | ICD-10-CM | POA: Diagnosis not present

## 2017-01-31 DIAGNOSIS — R1313 Dysphagia, pharyngeal phase: Secondary | ICD-10-CM | POA: Diagnosis not present

## 2017-01-31 DIAGNOSIS — J440 Chronic obstructive pulmonary disease with acute lower respiratory infection: Secondary | ICD-10-CM | POA: Diagnosis not present

## 2017-02-06 DIAGNOSIS — E119 Type 2 diabetes mellitus without complications: Secondary | ICD-10-CM | POA: Diagnosis not present

## 2017-02-07 DIAGNOSIS — D638 Anemia in other chronic diseases classified elsewhere: Secondary | ICD-10-CM | POA: Diagnosis not present

## 2017-02-07 DIAGNOSIS — L4052 Psoriatic arthritis mutilans: Secondary | ICD-10-CM | POA: Diagnosis not present

## 2017-02-07 DIAGNOSIS — R1313 Dysphagia, pharyngeal phase: Secondary | ICD-10-CM | POA: Diagnosis not present

## 2017-02-07 DIAGNOSIS — E1165 Type 2 diabetes mellitus with hyperglycemia: Secondary | ICD-10-CM | POA: Diagnosis not present

## 2017-02-07 DIAGNOSIS — J440 Chronic obstructive pulmonary disease with acute lower respiratory infection: Secondary | ICD-10-CM | POA: Diagnosis not present

## 2017-02-07 DIAGNOSIS — J181 Lobar pneumonia, unspecified organism: Secondary | ICD-10-CM | POA: Diagnosis not present

## 2017-02-13 DIAGNOSIS — E039 Hypothyroidism, unspecified: Secondary | ICD-10-CM | POA: Diagnosis not present

## 2017-02-13 DIAGNOSIS — E1165 Type 2 diabetes mellitus with hyperglycemia: Secondary | ICD-10-CM | POA: Diagnosis not present

## 2017-02-13 DIAGNOSIS — E782 Mixed hyperlipidemia: Secondary | ICD-10-CM | POA: Diagnosis not present

## 2017-02-13 DIAGNOSIS — I10 Essential (primary) hypertension: Secondary | ICD-10-CM | POA: Diagnosis not present

## 2017-02-17 DIAGNOSIS — Z79899 Other long term (current) drug therapy: Secondary | ICD-10-CM | POA: Diagnosis not present

## 2017-02-17 DIAGNOSIS — M25559 Pain in unspecified hip: Secondary | ICD-10-CM | POA: Diagnosis not present

## 2017-02-17 DIAGNOSIS — M25562 Pain in left knee: Secondary | ICD-10-CM | POA: Diagnosis not present

## 2017-02-17 DIAGNOSIS — M179 Osteoarthritis of knee, unspecified: Secondary | ICD-10-CM | POA: Diagnosis not present

## 2017-02-17 DIAGNOSIS — L4050 Arthropathic psoriasis, unspecified: Secondary | ICD-10-CM | POA: Diagnosis not present

## 2017-02-17 DIAGNOSIS — M25561 Pain in right knee: Secondary | ICD-10-CM | POA: Diagnosis not present

## 2017-02-17 DIAGNOSIS — M1990 Unspecified osteoarthritis, unspecified site: Secondary | ICD-10-CM | POA: Diagnosis not present

## 2017-02-20 DIAGNOSIS — J181 Lobar pneumonia, unspecified organism: Secondary | ICD-10-CM | POA: Diagnosis not present

## 2017-02-20 DIAGNOSIS — J440 Chronic obstructive pulmonary disease with acute lower respiratory infection: Secondary | ICD-10-CM | POA: Diagnosis not present

## 2017-02-20 DIAGNOSIS — D638 Anemia in other chronic diseases classified elsewhere: Secondary | ICD-10-CM | POA: Diagnosis not present

## 2017-02-20 DIAGNOSIS — R1313 Dysphagia, pharyngeal phase: Secondary | ICD-10-CM | POA: Diagnosis not present

## 2017-02-20 DIAGNOSIS — E1165 Type 2 diabetes mellitus with hyperglycemia: Secondary | ICD-10-CM | POA: Diagnosis not present

## 2017-02-20 DIAGNOSIS — L4052 Psoriatic arthritis mutilans: Secondary | ICD-10-CM | POA: Diagnosis not present

## 2017-03-04 DIAGNOSIS — M4716 Other spondylosis with myelopathy, lumbar region: Secondary | ICD-10-CM | POA: Diagnosis not present

## 2017-03-04 DIAGNOSIS — Z6822 Body mass index (BMI) 22.0-22.9, adult: Secondary | ICD-10-CM | POA: Diagnosis not present

## 2017-04-29 DIAGNOSIS — M25561 Pain in right knee: Secondary | ICD-10-CM | POA: Diagnosis not present

## 2017-04-29 DIAGNOSIS — M25579 Pain in unspecified ankle and joints of unspecified foot: Secondary | ICD-10-CM | POA: Diagnosis not present

## 2017-04-29 DIAGNOSIS — L4050 Arthropathic psoriasis, unspecified: Secondary | ICD-10-CM | POA: Diagnosis not present

## 2017-04-29 DIAGNOSIS — Z79899 Other long term (current) drug therapy: Secondary | ICD-10-CM | POA: Diagnosis not present

## 2017-04-29 DIAGNOSIS — M1711 Unilateral primary osteoarthritis, right knee: Secondary | ICD-10-CM | POA: Diagnosis not present

## 2017-04-29 DIAGNOSIS — M1990 Unspecified osteoarthritis, unspecified site: Secondary | ICD-10-CM | POA: Diagnosis not present

## 2017-05-15 DIAGNOSIS — E1165 Type 2 diabetes mellitus with hyperglycemia: Secondary | ICD-10-CM | POA: Diagnosis not present

## 2017-05-15 DIAGNOSIS — I10 Essential (primary) hypertension: Secondary | ICD-10-CM | POA: Diagnosis not present

## 2017-05-15 DIAGNOSIS — E039 Hypothyroidism, unspecified: Secondary | ICD-10-CM | POA: Diagnosis not present

## 2017-05-15 DIAGNOSIS — E782 Mixed hyperlipidemia: Secondary | ICD-10-CM | POA: Diagnosis not present

## 2017-05-22 DIAGNOSIS — E782 Mixed hyperlipidemia: Secondary | ICD-10-CM | POA: Diagnosis not present

## 2017-05-22 DIAGNOSIS — E1165 Type 2 diabetes mellitus with hyperglycemia: Secondary | ICD-10-CM | POA: Diagnosis not present

## 2017-05-22 DIAGNOSIS — L03115 Cellulitis of right lower limb: Secondary | ICD-10-CM | POA: Diagnosis not present

## 2017-05-22 DIAGNOSIS — E039 Hypothyroidism, unspecified: Secondary | ICD-10-CM | POA: Diagnosis not present

## 2017-06-10 DIAGNOSIS — Z6823 Body mass index (BMI) 23.0-23.9, adult: Secondary | ICD-10-CM | POA: Diagnosis not present

## 2017-06-10 DIAGNOSIS — M47816 Spondylosis without myelopathy or radiculopathy, lumbar region: Secondary | ICD-10-CM | POA: Diagnosis not present

## 2017-06-10 DIAGNOSIS — M4716 Other spondylosis with myelopathy, lumbar region: Secondary | ICD-10-CM | POA: Diagnosis not present

## 2017-06-12 DIAGNOSIS — E782 Mixed hyperlipidemia: Secondary | ICD-10-CM | POA: Diagnosis not present

## 2017-06-12 DIAGNOSIS — E1165 Type 2 diabetes mellitus with hyperglycemia: Secondary | ICD-10-CM | POA: Diagnosis not present

## 2017-06-12 DIAGNOSIS — E039 Hypothyroidism, unspecified: Secondary | ICD-10-CM | POA: Diagnosis not present

## 2017-07-26 ENCOUNTER — Encounter (HOSPITAL_COMMUNITY): Payer: Self-pay

## 2017-07-26 ENCOUNTER — Inpatient Hospital Stay (HOSPITAL_COMMUNITY)
Admission: EM | Admit: 2017-07-26 | Discharge: 2017-07-29 | DRG: 871 | Disposition: A | Discharge status: Home / Self Care | Payer: Medicare Other | Attending: Internal Medicine | Admitting: Internal Medicine

## 2017-07-26 ENCOUNTER — Emergency Department (HOSPITAL_COMMUNITY): Payer: Medicare Other

## 2017-07-26 DIAGNOSIS — Z7984 Long term (current) use of oral hypoglycemic drugs: Secondary | ICD-10-CM

## 2017-07-26 DIAGNOSIS — G9341 Metabolic encephalopathy: Secondary | ICD-10-CM | POA: Diagnosis present

## 2017-07-26 DIAGNOSIS — D631 Anemia in chronic kidney disease: Secondary | ICD-10-CM | POA: Diagnosis present

## 2017-07-26 DIAGNOSIS — J189 Pneumonia, unspecified organism: Secondary | ICD-10-CM | POA: Diagnosis not present

## 2017-07-26 DIAGNOSIS — Z881 Allergy status to other antibiotic agents status: Secondary | ICD-10-CM

## 2017-07-26 DIAGNOSIS — E039 Hypothyroidism, unspecified: Secondary | ICD-10-CM | POA: Diagnosis not present

## 2017-07-26 DIAGNOSIS — Z79891 Long term (current) use of opiate analgesic: Secondary | ICD-10-CM

## 2017-07-26 DIAGNOSIS — E860 Dehydration: Secondary | ICD-10-CM | POA: Diagnosis present

## 2017-07-26 DIAGNOSIS — R4182 Altered mental status, unspecified: Secondary | ICD-10-CM | POA: Diagnosis not present

## 2017-07-26 DIAGNOSIS — Z8249 Family history of ischemic heart disease and other diseases of the circulatory system: Secondary | ICD-10-CM

## 2017-07-26 DIAGNOSIS — Y95 Nosocomial condition: Secondary | ICD-10-CM | POA: Diagnosis present

## 2017-07-26 DIAGNOSIS — G894 Chronic pain syndrome: Secondary | ICD-10-CM | POA: Diagnosis present

## 2017-07-26 DIAGNOSIS — Z79890 Hormone replacement therapy (postmenopausal): Secondary | ICD-10-CM

## 2017-07-26 DIAGNOSIS — Z8614 Personal history of Methicillin resistant Staphylococcus aureus infection: Secondary | ICD-10-CM | POA: Diagnosis not present

## 2017-07-26 DIAGNOSIS — J690 Pneumonitis due to inhalation of food and vomit: Secondary | ICD-10-CM | POA: Diagnosis not present

## 2017-07-26 DIAGNOSIS — L4050 Arthropathic psoriasis, unspecified: Secondary | ICD-10-CM | POA: Diagnosis not present

## 2017-07-26 DIAGNOSIS — Z7952 Long term (current) use of systemic steroids: Secondary | ICD-10-CM

## 2017-07-26 DIAGNOSIS — G4733 Obstructive sleep apnea (adult) (pediatric): Secondary | ICD-10-CM | POA: Diagnosis present

## 2017-07-26 DIAGNOSIS — Z79899 Other long term (current) drug therapy: Secondary | ICD-10-CM

## 2017-07-26 DIAGNOSIS — A419 Sepsis, unspecified organism: Principal | ICD-10-CM | POA: Diagnosis present

## 2017-07-26 DIAGNOSIS — N182 Chronic kidney disease, stage 2 (mild): Secondary | ICD-10-CM | POA: Diagnosis present

## 2017-07-26 DIAGNOSIS — E785 Hyperlipidemia, unspecified: Secondary | ICD-10-CM | POA: Diagnosis present

## 2017-07-26 DIAGNOSIS — J441 Chronic obstructive pulmonary disease with (acute) exacerbation: Secondary | ICD-10-CM | POA: Diagnosis present

## 2017-07-26 DIAGNOSIS — E1122 Type 2 diabetes mellitus with diabetic chronic kidney disease: Secondary | ICD-10-CM | POA: Diagnosis present

## 2017-07-26 DIAGNOSIS — M069 Rheumatoid arthritis, unspecified: Secondary | ICD-10-CM | POA: Diagnosis present

## 2017-07-26 DIAGNOSIS — R042 Hemoptysis: Secondary | ICD-10-CM | POA: Diagnosis present

## 2017-07-26 DIAGNOSIS — Z7951 Long term (current) use of inhaled steroids: Secondary | ICD-10-CM

## 2017-07-26 DIAGNOSIS — Z96643 Presence of artificial hip joint, bilateral: Secondary | ICD-10-CM | POA: Diagnosis present

## 2017-07-26 DIAGNOSIS — N179 Acute kidney failure, unspecified: Secondary | ICD-10-CM | POA: Diagnosis not present

## 2017-07-26 DIAGNOSIS — I10 Essential (primary) hypertension: Secondary | ICD-10-CM | POA: Diagnosis not present

## 2017-07-26 DIAGNOSIS — Z87891 Personal history of nicotine dependence: Secondary | ICD-10-CM | POA: Diagnosis not present

## 2017-07-26 DIAGNOSIS — J9601 Acute respiratory failure with hypoxia: Secondary | ICD-10-CM

## 2017-07-26 DIAGNOSIS — Z89512 Acquired absence of left leg below knee: Secondary | ICD-10-CM

## 2017-07-26 DIAGNOSIS — I1310 Hypertensive heart and chronic kidney disease without heart failure, with stage 1 through stage 4 chronic kidney disease, or unspecified chronic kidney disease: Secondary | ICD-10-CM | POA: Diagnosis present

## 2017-07-26 DIAGNOSIS — J9621 Acute and chronic respiratory failure with hypoxia: Secondary | ICD-10-CM | POA: Diagnosis present

## 2017-07-26 DIAGNOSIS — Z8619 Personal history of other infectious and parasitic diseases: Secondary | ICD-10-CM

## 2017-07-26 DIAGNOSIS — I959 Hypotension, unspecified: Secondary | ICD-10-CM | POA: Diagnosis present

## 2017-07-26 DIAGNOSIS — E1129 Type 2 diabetes mellitus with other diabetic kidney complication: Secondary | ICD-10-CM | POA: Diagnosis present

## 2017-07-26 DIAGNOSIS — R1310 Dysphagia, unspecified: Secondary | ICD-10-CM | POA: Diagnosis present

## 2017-07-26 DIAGNOSIS — R0789 Other chest pain: Secondary | ICD-10-CM | POA: Diagnosis not present

## 2017-07-26 LAB — CBC WITH DIFFERENTIAL/PLATELET
Basophils Absolute: 0 10*3/uL (ref 0.0–0.1)
Basophils Relative: 0 %
EOS ABS: 0.1 10*3/uL (ref 0.0–0.7)
Eosinophils Relative: 1 %
HEMATOCRIT: 39.2 % (ref 39.0–52.0)
Hemoglobin: 12.4 g/dL — ABNORMAL LOW (ref 13.0–17.0)
Lymphocytes Relative: 10 %
Lymphs Abs: 0.9 10*3/uL (ref 0.7–4.0)
MCH: 28.8 pg (ref 26.0–34.0)
MCHC: 31.6 g/dL (ref 30.0–36.0)
MCV: 91.2 fL (ref 78.0–100.0)
MONOS PCT: 5 %
Monocytes Absolute: 0.4 10*3/uL (ref 0.1–1.0)
NEUTROS ABS: 7.6 10*3/uL (ref 1.7–7.7)
NEUTROS PCT: 84 %
Platelets: 243 10*3/uL (ref 150–400)
RBC: 4.3 MIL/uL (ref 4.22–5.81)
RDW: 15.4 % (ref 11.5–15.5)
WBC: 9 10*3/uL (ref 4.0–10.5)

## 2017-07-26 LAB — I-STAT CG4 LACTIC ACID, ED
LACTIC ACID, VENOUS: 2.17 mmol/L — AB (ref 0.5–1.9)
Lactic Acid, Venous: 2.91 mmol/L (ref 0.5–1.9)

## 2017-07-26 LAB — RAPID URINE DRUG SCREEN, HOSP PERFORMED
Amphetamines: NOT DETECTED
BARBITURATES: NOT DETECTED
Benzodiazepines: POSITIVE — AB
Cocaine: NOT DETECTED
Opiates: POSITIVE — AB
Tetrahydrocannabinol: NOT DETECTED

## 2017-07-26 LAB — URINALYSIS, ROUTINE W REFLEX MICROSCOPIC
BILIRUBIN URINE: NEGATIVE
Glucose, UA: >=500 mg/dL — AB
Hgb urine dipstick: NEGATIVE
Ketones, ur: NEGATIVE mg/dL
Leukocytes, UA: NEGATIVE
Nitrite: NEGATIVE
PROTEIN: 30 mg/dL — AB
Specific Gravity, Urine: 1.023 (ref 1.005–1.030)
pH: 5 (ref 5.0–8.0)

## 2017-07-26 LAB — I-STAT TROPONIN, ED: TROPONIN I, POC: 0 ng/mL (ref 0.00–0.08)

## 2017-07-26 LAB — I-STAT VENOUS BLOOD GAS, ED
ACID-BASE EXCESS: 4 mmol/L — AB (ref 0.0–2.0)
Bicarbonate: 30.2 mmol/L — ABNORMAL HIGH (ref 20.0–28.0)
O2 SAT: 99 %
TCO2: 32 mmol/L (ref 22–32)
pCO2, Ven: 49.8 mmHg (ref 44.0–60.0)
pH, Ven: 7.39 (ref 7.250–7.430)
pO2, Ven: 141 mmHg — ABNORMAL HIGH (ref 32.0–45.0)

## 2017-07-26 LAB — COMPREHENSIVE METABOLIC PANEL
ALK PHOS: 76 U/L (ref 38–126)
ALT: 16 U/L — AB (ref 17–63)
ANION GAP: 13 (ref 5–15)
AST: 22 U/L (ref 15–41)
Albumin: 3.2 g/dL — ABNORMAL LOW (ref 3.5–5.0)
BILIRUBIN TOTAL: 0.7 mg/dL (ref 0.3–1.2)
BUN: 36 mg/dL — ABNORMAL HIGH (ref 6–20)
CALCIUM: 10.1 mg/dL (ref 8.9–10.3)
CO2: 24 mmol/L (ref 22–32)
CREATININE: 1.62 mg/dL — AB (ref 0.61–1.24)
Chloride: 100 mmol/L — ABNORMAL LOW (ref 101–111)
GFR calc non Af Amer: 40 mL/min — ABNORMAL LOW (ref >60)
GFR, EST AFRICAN AMERICAN: 47 mL/min — AB (ref >60)
Glucose, Bld: 240 mg/dL — ABNORMAL HIGH (ref 65–99)
Potassium: 4.3 mmol/L (ref 3.5–5.1)
Sodium: 137 mmol/L (ref 135–145)
TOTAL PROTEIN: 6.6 g/dL (ref 6.5–8.1)

## 2017-07-26 LAB — AMMONIA: AMMONIA: 22 umol/L (ref 9–35)

## 2017-07-26 LAB — STREP PNEUMONIAE URINARY ANTIGEN: STREP PNEUMO URINARY ANTIGEN: NEGATIVE

## 2017-07-26 LAB — PROCALCITONIN: Procalcitonin: 1.74 ng/mL

## 2017-07-26 LAB — INFLUENZA PANEL BY PCR (TYPE A & B)
Influenza A By PCR: NEGATIVE
Influenza B By PCR: NEGATIVE

## 2017-07-26 LAB — LACTIC ACID, PLASMA: Lactic Acid, Venous: 1.7 mmol/L (ref 0.5–1.9)

## 2017-07-26 LAB — LIPASE, BLOOD: LIPASE: 23 U/L (ref 11–51)

## 2017-07-26 MED ORDER — PREDNISONE 5 MG PO TABS
5.0000 mg | ORAL_TABLET | Freq: Every day | ORAL | Status: DC
  Administered 2017-07-28 – 2017-07-29 (×2): 5 mg via ORAL
  Filled 2017-07-26 (×3): qty 1

## 2017-07-26 MED ORDER — SODIUM CHLORIDE 0.9 % IV BOLUS
500.0000 mL | Freq: Once | INTRAVENOUS | Status: AC
  Administered 2017-07-26: 500 mL via INTRAVENOUS

## 2017-07-26 MED ORDER — ACETAMINOPHEN 650 MG RE SUPP
650.0000 mg | Freq: Once | RECTAL | Status: DC
  Filled 2017-07-26: qty 1

## 2017-07-26 MED ORDER — SODIUM CHLORIDE 0.9 % IV SOLN
INTRAVENOUS | Status: DC
  Administered 2017-07-27 – 2017-07-28 (×4): via INTRAVENOUS

## 2017-07-26 MED ORDER — SODIUM CHLORIDE 0.9 % IV BOLUS (SEPSIS)
1000.0000 mL | Freq: Once | INTRAVENOUS | Status: AC
  Administered 2017-07-26: 1000 mL via INTRAVENOUS

## 2017-07-26 MED ORDER — DM-GUAIFENESIN ER 30-600 MG PO TB12: 1.0000 | ORAL_TABLET | Freq: Two times a day (BID) | ORAL | Status: DC | PRN

## 2017-07-26 MED ORDER — OMEGA-3-ACID ETHYL ESTERS 1 G PO CAPS
1.0000 g | ORAL_CAPSULE | Freq: Every day | ORAL | Status: DC
  Administered 2017-07-28 – 2017-07-29 (×2): 1 g via ORAL
  Filled 2017-07-26 (×3): qty 1

## 2017-07-26 MED ORDER — METRONIDAZOLE IN NACL 5-0.79 MG/ML-% IV SOLN
500.0000 mg | Freq: Three times a day (TID) | INTRAVENOUS | Status: DC
  Administered 2017-07-26 – 2017-07-27 (×3): 500 mg via INTRAVENOUS
  Filled 2017-07-26 (×3): qty 100

## 2017-07-26 MED ORDER — ENOXAPARIN SODIUM 40 MG/0.4ML ~~LOC~~ SOLN
40.0000 mg | SUBCUTANEOUS | Status: DC
  Administered 2017-07-27 – 2017-07-28 (×2): 40 mg via SUBCUTANEOUS
  Filled 2017-07-26 (×3): qty 0.4

## 2017-07-26 MED ORDER — HYDRALAZINE HCL 20 MG/ML IJ SOLN
5.0000 mg | INTRAMUSCULAR | Status: DC | PRN
  Administered 2017-07-29: 5 mg via INTRAVENOUS
  Filled 2017-07-26: qty 1

## 2017-07-26 MED ORDER — ALBUTEROL SULFATE (2.5 MG/3ML) 0.083% IN NEBU
2.5000 mg | INHALATION_SOLUTION | RESPIRATORY_TRACT | Status: DC | PRN
  Administered 2017-07-27: 2.5 mg via RESPIRATORY_TRACT
  Filled 2017-07-26: qty 3

## 2017-07-26 MED ORDER — FLUTICASONE FUROATE-VILANTEROL 100-25 MCG/INH IN AEPB
1.0000 | INHALATION_SPRAY | Freq: Every day | RESPIRATORY_TRACT | Status: DC
  Administered 2017-07-27 – 2017-07-29 (×3): 1 via RESPIRATORY_TRACT
  Filled 2017-07-26: qty 28

## 2017-07-26 MED ORDER — TEMAZEPAM 7.5 MG PO CAPS
7.5000 mg | ORAL_CAPSULE | Freq: Every evening | ORAL | Status: DC | PRN
  Administered 2017-07-27 – 2017-07-28 (×3): 7.5 mg via ORAL
  Filled 2017-07-26 (×3): qty 1

## 2017-07-26 MED ORDER — INSULIN ASPART 100 UNIT/ML ~~LOC~~ SOLN: 0.0000 [IU] | Freq: Every day | SUBCUTANEOUS | Status: DC

## 2017-07-26 MED ORDER — TRIAMCINOLONE ACETONIDE 0.1 % EX CREA: 1.0000 "application " | TOPICAL_CREAM | Freq: Two times a day (BID) | CUTANEOUS | Status: DC | PRN

## 2017-07-26 MED ORDER — HYDROCORTISONE NA SUCCINATE PF 100 MG IJ SOLR
50.0000 mg | Freq: Once | INTRAMUSCULAR | Status: AC
  Administered 2017-07-26: 50 mg via INTRAVENOUS
  Filled 2017-07-26: qty 2

## 2017-07-26 MED ORDER — SODIUM CHLORIDE 0.9 % IV SOLN
1.0000 g | Freq: Three times a day (TID) | INTRAVENOUS | Status: DC
  Administered 2017-07-27: 1 g via INTRAVENOUS
  Filled 2017-07-26 (×3): qty 1

## 2017-07-26 MED ORDER — ONDANSETRON HCL 4 MG/2ML IJ SOLN
4.0000 mg | Freq: Three times a day (TID) | INTRAMUSCULAR | Status: DC | PRN
  Administered 2017-07-29: 4 mg via INTRAVENOUS
  Filled 2017-07-26: qty 2

## 2017-07-26 MED ORDER — VANCOMYCIN HCL IN DEXTROSE 1-5 GM/200ML-% IV SOLN
1000.0000 mg | Freq: Once | INTRAVENOUS | Status: AC
  Administered 2017-07-26: 1000 mg via INTRAVENOUS
  Filled 2017-07-26: qty 200

## 2017-07-26 MED ORDER — CALCIUM CARBONATE 1250 (500 CA) MG PO TABS
600.0000 mg | ORAL_TABLET | Freq: Three times a day (TID) | ORAL | Status: DC
  Administered 2017-07-27 – 2017-07-29 (×6): 625 mg via ORAL
  Filled 2017-07-26: qty 1
  Filled 2017-07-26: qty 0.5
  Filled 2017-07-26 (×2): qty 1
  Filled 2017-07-26 (×2): qty 0.5
  Filled 2017-07-26 (×3): qty 1

## 2017-07-26 MED ORDER — HYDROMORPHONE HCL 2 MG PO TABS: 2.0000 mg | ORAL_TABLET | ORAL | Status: DC | PRN

## 2017-07-26 MED ORDER — VANCOMYCIN HCL IN DEXTROSE 1-5 GM/200ML-% IV SOLN
1000.0000 mg | INTRAVENOUS | Status: DC
  Filled 2017-07-26: qty 200

## 2017-07-26 MED ORDER — FOLIC ACID 1 MG PO TABS
1.0000 mg | ORAL_TABLET | Freq: Every day | ORAL | Status: DC
Start: 2017-07-26 — End: 2017-07-29
  Administered 2017-07-28 – 2017-07-29 (×2): 1 mg via ORAL
  Filled 2017-07-26 (×2): qty 1

## 2017-07-26 MED ORDER — LEFLUNOMIDE 20 MG PO TABS
30.0000 mg | ORAL_TABLET | Freq: Every day | ORAL | Status: DC
  Administered 2017-07-26 – 2017-07-29 (×3): 30 mg via ORAL
  Filled 2017-07-26 (×4): qty 1

## 2017-07-26 MED ORDER — SODIUM CHLORIDE 0.9 % IV SOLN
1.0000 g | Freq: Three times a day (TID) | INTRAVENOUS | Status: DC
  Filled 2017-07-26 (×2): qty 1

## 2017-07-26 MED ORDER — OXYCODONE-ACETAMINOPHEN 5-325 MG PO TABS
1.0000 | ORAL_TABLET | Freq: Four times a day (QID) | ORAL | Status: DC | PRN
  Administered 2017-07-27 – 2017-07-29 (×4): 1 via ORAL
  Filled 2017-07-26 (×4): qty 1

## 2017-07-26 MED ORDER — INSULIN ASPART 100 UNIT/ML ~~LOC~~ SOLN
0.0000 [IU] | Freq: Three times a day (TID) | SUBCUTANEOUS | Status: DC
  Administered 2017-07-27: 1 [IU] via SUBCUTANEOUS
  Administered 2017-07-28 (×2): 2 [IU] via SUBCUTANEOUS
  Administered 2017-07-29: 3 [IU] via SUBCUTANEOUS
  Filled 2017-07-26: qty 1

## 2017-07-26 MED ORDER — VITAMIN C 500 MG PO TABS
500.0000 mg | ORAL_TABLET | Freq: Every day | ORAL | Status: DC
  Administered 2017-07-28 – 2017-07-29 (×2): 500 mg via ORAL
  Filled 2017-07-26 (×2): qty 1

## 2017-07-26 MED ORDER — IPRATROPIUM BROMIDE 0.02 % IN SOLN
0.5000 mg | RESPIRATORY_TRACT | Status: DC
  Administered 2017-07-27 (×5): 0.5 mg via RESPIRATORY_TRACT
  Filled 2017-07-26 (×5): qty 2.5

## 2017-07-26 MED ORDER — AZTREONAM 2 G IJ SOLR
2.0000 g | Freq: Once | INTRAMUSCULAR | Status: AC
  Administered 2017-07-26: 2 g via INTRAVENOUS
  Filled 2017-07-26: qty 2

## 2017-07-26 MED ORDER — ADULT MULTIVITAMIN W/MINERALS CH
1.0000 | ORAL_TABLET | Freq: Every day | ORAL | Status: DC
  Administered 2017-07-28 – 2017-07-29 (×2): 1 via ORAL
  Filled 2017-07-26 (×2): qty 1

## 2017-07-26 MED ORDER — GABAPENTIN 300 MG PO CAPS
300.0000 mg | ORAL_CAPSULE | Freq: Three times a day (TID) | ORAL | Status: DC
  Administered 2017-07-27 – 2017-07-29 (×7): 300 mg via ORAL
  Filled 2017-07-26 (×7): qty 1

## 2017-07-26 MED ORDER — OXYCODONE-ACETAMINOPHEN 10-325 MG PO TABS: 1.0000 | ORAL_TABLET | Freq: Four times a day (QID) | ORAL | Status: DC | PRN

## 2017-07-26 MED ORDER — LEVOTHYROXINE SODIUM 75 MCG PO TABS
150.0000 ug | ORAL_TABLET | Freq: Every day | ORAL | Status: DC
  Administered 2017-07-28 – 2017-07-29 (×2): 150 ug via ORAL
  Filled 2017-07-26: qty 2
  Filled 2017-07-26: qty 6
  Filled 2017-07-26: qty 1

## 2017-07-26 MED ORDER — LEVOFLOXACIN IN D5W 750 MG/150ML IV SOLN
750.0000 mg | Freq: Once | INTRAVENOUS | Status: AC
  Administered 2017-07-26: 750 mg via INTRAVENOUS
  Filled 2017-07-26: qty 150

## 2017-07-26 MED ORDER — ACETAMINOPHEN 325 MG PO TABS: 650.0000 mg | ORAL_TABLET | Freq: Four times a day (QID) | ORAL | Status: DC | PRN

## 2017-07-26 MED ORDER — OXYCODONE HCL 5 MG PO TABS
5.0000 mg | ORAL_TABLET | Freq: Four times a day (QID) | ORAL | Status: DC | PRN
  Administered 2017-07-28 – 2017-07-29 (×5): 5 mg via ORAL
  Filled 2017-07-26 (×5): qty 1

## 2017-07-26 NOTE — ED Notes (Signed)
Ice chips given

## 2017-07-26 NOTE — ED Notes (Signed)
Per previous RN antibiotics labeled 1st, 2nd, 3rd and are to be given one at a time in that order.  1st bag currently infusing.

## 2017-07-26 NOTE — ED Triage Notes (Signed)
Onset 3 days ago pt coughing up blood.  Today pt lethargic.  Opens eyes briefly to voice.  Pts wife reports fever, chills at home today.

## 2017-07-26 NOTE — ED Provider Notes (Signed)
[facility] DEPARTMENT Provider Note   CSN: [id number] Arrival date & time: 07/26/17  1654     History   Chief Complaint Chief Complaint  Patient presents with  . Fever  . Altered Mental Status    HPI ANTIONO ETTINGER is a 72 y.o. male.  The history is provided by medical records and a relative. The history is limited by the condition of the patient.  Altered Mental Status   This is a new problem. The current episode started 6 to 12 hours ago. Associated symptoms include somnolence and unresponsiveness. Pertinent negatives include no confusion and no agitation. His past medical history does not include CVA or COPD.  Cough  This is a chronic problem. The current episode started more than 1 week ago. The problem occurs constantly. The problem has been gradually worsening. The cough is productive of blood-tinged sputum. The maximum temperature recorded prior to his arrival was 102 to 102.9 F. The fever has been present for less than 1 day. Associated symptoms include chills and shortness of breath. Pertinent negatives include no chest pain, no headaches, no rhinorrhea and no wheezing. He has tried nothing for the symptoms. The treatment provided no relief. His past medical history is significant for pneumonia. His past medical history does not include COPD.     LVL 5 caveat for AMS  Past Medical History:  Diagnosis Date  . Anemia   . Anemia, chronic disease 10/14/2011  . 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 Chronic airway obstruction, not elsewhere classified   . Cryptococcus ([place]) 2008   left arm  . Diabetes ([place])   . Diabetic foot ulcer ([place]) 09/18/2016  . Esophageal reflux   . Hypothyroidism   . Low back pain   . MRSA (methicillin resistant Staphylococcus aureus)   . MSSA (methicillin susceptible Staphylococcus aureus) septicemia ([place])    L 2nd toe-amputated June 2011  . Nephrolithiasis   . Osteoarthritis   . Psoriatic arthritis ([place])   .  Psoriatic arthritis ([place]) 10/14/2011  . Shingles    x2  . Sleep apnea    cpap  . Unspecified essential hypertension     Patient Active Problem List   Diagnosis Date Noted  . DM type 2 (diabetes mellitus, type 2) ([place]) 12/19/2016  . AKI (acute kidney injury) ([place]) 12/19/2016  . Lobar pneumonia ([place]) 12/19/2016  . Community acquired pneumonia 12/16/2016  . Uncontrolled hypertension 12/16/2016  . Hypokalemia 12/16/2016  . Acute metabolic encephalopathy 79/05/4095  . Acute respiratory failure with hypoxia ([place])   . Multifocal pneumonia   . BKA stump complication ([place]) 35/32/9924  . Diabetic foot ulcer ([place]) 09/18/2016  . Preoperative clearance 09/09/2012  . Arthritis, septic, ankle or foot ([place]) 08/19/2012  . Left ankle swelling 01/22/2012  . Anemia, chronic disease 10/14/2011  . Rheumatoid arthritis ([place]) 10/14/2011  . Psoriatic arthritis ([place]) 10/14/2011  . Confusion caused by a drug ([place]) 06/20/2011  . HTN (hypertension) 05/09/2011  . Leg edema 04/24/2011  . Cellulitis, leg 04/24/2011  . Cellulitis and abscess of leg 04/24/2011  . Immunosuppression ([place]) 04/24/2011  . Insomnia, chronic 02/27/2011  . CELLULITIS, HAND, RIGHT 05/23/2010  . 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 LUNG NODULE 07/18/2009  . ABDOMINAL/PELVIC SWELLING MASS/LUMP UNSPEC SITE 07/18/2009  . HYPERLIPIDEMIA 06/30/2009  . DEGENERATIVE DISC DISEASE 06/30/2009  . HIP REPLACEMENT, LEFT, HX OF 06/30/2009  . OTHER POSTSURGICAL STATUS OTHER 06/30/2009  . FEVER UNSPECIFIED 06/14/2009  . ALLERGIC RHINITIS 08/02/2007  .  CRYPTOCOCCOSIS 12/15/2006  . HYPOTHYROIDISM 12/15/2006  . Obstructive sleep apnea 12/15/2006  . HYPERTENSION 12/15/2006  . 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 GERD 12/15/2006  . PSORIATIC ARTHRITIS 12/15/2006  . OSTEOARTHRITIS 12/15/2006  . LOW BACK PAIN 12/15/2006  . Edema 12/15/2006  . NEPHROLITHIASIS, HX OF 12/15/2006  . Personal history of tobacco use, presenting hazards to  health 12/15/2006    Past Surgical History:  Procedure Laterality Date  . BACK SURGERY     3 fusions, 4th - undo 3rd fusion  . THORACIC SPINE SURGERY    . TOE AMPUTATION    . TOE SURGERY  2009   little toe on right foot , bone removed  . TOTAL HIP ARTHROPLASTY     bilateral        Home Medications    Prior to Admission medications   Medication Sig Start Date End Date Taking? Authorizing Provider  amLODipine (NORVASC) 5 MG tablet Take 1 tablet (5 mg total) by mouth daily. Patient not taking: Reported on 01/29/2017 12/17/16   Dhungel, Flonnie Overman, MD  Ascorbic Acid (VITAMIN C) 500 MG CHEW Chew 500 mg by mouth daily.      [provider]  blood glucose meter kit and supplies Dispense based on patient and insurance preference. Use up to four times daily as directed. (FOR ICD-9 250.00, 250.01). 12/23/16   Regalado, Belkys A, MD  calcium carbonate (CALCIUM 600) 600 MG TABS Take 600 mg by mouth 3 (three) times daily.      [provider]  carvedilol (COREG) 25 MG tablet TAKE ONE TABLET TWICE DAILY 09/27/12   Wall, Marijo Conception, MD  cyclobenzaprine (FLEXERIL) 10 MG tablet Take 10 mg by mouth every 8 (eight) hours as needed. For muscle pain.    [provider]  fluticasone furoate-vilanterol (BREO ELLIPTA) 100-25 MCG/INH AEPB Inhale 1 puff into the lungs daily. Inhale 1 puff then rinse mouth, once daily 10/05/15   Baird Lyons D, MD  folic acid (FOLVITE) 1 MG tablet Take 1 mg by mouth daily.      [provider]  gabapentin (NEURONTIN) 300 MG capsule Take 1 capsule by mouth 3 (three) times daily. 12/05/16   [provider]  glipiZIDE (GLUCOTROL) 5 MG tablet Take 0.5 tablets (2.5 mg total) by mouth daily before breakfast. 12/24/16   Regalado, Belkys A, MD  GLUCERNA (GLUCERNA) LIQD Take 237 mLs by mouth 2 (two) times daily between meals. Patient not taking: Reported on 01/29/2017 12/23/16   Regalado, Jerald Kief A, MD  HYDROmorphone (DILAUDID) 2 MG tablet Take 2 mg  by mouth every 4 (four) hours.      [provider]  leflunomide (ARAVA) 10 MG tablet Take 20 mg by mouth daily.  09/21/14   [provider]  levothyroxine (SYNTHROID, LEVOTHROID) 150 MCG tablet Take 175 mcg by mouth daily.     [provider]  losartan (COZAAR) 50 MG tablet Take 50 mg by mouth.    [provider]  metroNIDAZOLE (FLAGYL) 500 MG tablet Take 1 tablet (500 mg total) by mouth every 8 (eight) hours. Patient not taking: Reported on 01/29/2017 12/23/16   Regalado, Jerald Kief A, MD  Multiple Vitamin (MULITIVITAMIN WITH MINERALS) TABS Take 1 tablet by mouth daily.    [provider]  Omega-3 Fatty Acids (FISH OIL) 1000 MG CAPS Take 1 capsule by mouth 3 (three) times daily.     [provider]  oxyCODONE-acetaminophen (PERCOCET) 10-325 MG tablet Take 1 to 2 tablets every 6 hours as  needed for pain. 01/02/17   [provider]  predniSONE (DELTASONE) 5 MG tablet Take 5 mg by mouth daily.      [provider]  SYMBICORT 160-4.5 MCG/ACT inhaler USE TWO PUFFS TWICE DAILY Patient not taking: Reported on 01/29/2017 04/04/15   Baird Lyons D, MD  temazepam (RESTORIL) 15 MG capsule TAKE 1 OR 2 CAPSULES AT BEDTIME AS NEEDED FOR SLEEP. 03/26/16   Baird Lyons D, MD  triamcinolone cream (KENALOG) 0.1 % Apply topically.    [provider]    Family History Family History  Problem Relation Age of Onset  . Coronary artery disease Mother   . Pneumonia Mother   . Heart failure Mother   . Breast cancer Mother   . Psoriasis Father   . Coronary artery disease Brother   . Colon cancer Neg Hx   . Stomach cancer Neg Hx     Social History Social History   Tobacco Use  . Smoking status: Former Smoker    Types: Cigarettes    Last attempt to quit: 04/24/1991    Years since quitting: 26.2  . Smokeless tobacco: Never Used  Substance Use Topics  . Alcohol use: No  . Drug use: No     Allergies   Amoxicillin-pot  clavulanate and Sulfur   Review of Systems Review of Systems  Unable to perform ROS: Mental status change  Constitutional: Positive for chills, fatigue and fever. Negative for diaphoresis.  HENT: Positive for congestion. Negative for rhinorrhea.   Respiratory: Positive for cough, chest tightness and shortness of breath. Negative for wheezing.   Cardiovascular: Negative for chest pain.  Gastrointestinal: Negative for abdominal pain, constipation, diarrhea, nausea and vomiting.  Genitourinary: Negative for dysuria.  Musculoskeletal: Negative for neck pain and neck stiffness.  Skin: Negative for rash and wound.  Neurological: Negative for light-headedness and headaches.  Psychiatric/Behavioral: Negative for agitation and confusion.  All other systems reviewed and are negative.    Physical Exam Updated Vital Signs BP (!) 80/53   Pulse 87   Temp (!) 102.3 F (39.1 C) (Rectal)   Resp (!) 22   SpO2 100%   Physical Exam  Constitutional: He appears well-developed and well-nourished. He appears ill. No distress.  HENT:  Head: Normocephalic and atraumatic.  Nose: Nose normal.  Mouth/Throat: Oropharynx is clear and moist. No oropharyngeal exudate.  Eyes: Pupils are equal, round, and reactive to light. Conjunctivae and EOM are normal.  Neck: Normal range of motion. Neck supple.  Cardiovascular: Normal rate and intact distal pulses.  No murmur heard. Pulmonary/Chest: No stridor. Tachypnea noted. He is in respiratory distress. He has no wheezes. He has rhonchi. He has rales. He exhibits no tenderness.  Abdominal: Soft. Bowel sounds are normal. He exhibits no distension. There is no tenderness.  Musculoskeletal: Normal range of motion. He exhibits no edema or tenderness.  Lymphadenopathy:    He has no cervical adenopathy.  Neurological: No sensory deficit. He exhibits normal muscle tone.  Skin: Capillary refill takes less than 2 seconds. He is not diaphoretic. No erythema. No pallor.    Psychiatric: He has a normal mood and affect.  Nursing note and vitals reviewed.    ED Treatments / Results  Labs (all labs ordered are listed, but only abnormal results are displayed) Labs Reviewed  COMPREHENSIVE METABOLIC PANEL - Abnormal; Notable for the following components:      Result Value   Chloride 100 (*)    Glucose, Bld 240 (*)    BUN 36 (*)  Creatinine, Ser 1.62 (*)    Albumin 3.2 (*)    ALT 16 (*)    GFR calc non Af Amer 40 (*)    GFR calc Af Amer 47 (*)    All other components within normal limits  CBC WITH DIFFERENTIAL/PLATELET - Abnormal; Notable for the following components:   Hemoglobin 12.4 (*)    All other components within normal limits  URINALYSIS, ROUTINE W REFLEX MICROSCOPIC - Abnormal; Notable for the following components:   Color, Urine AMBER (*)    APPearance HAZY (*)    Glucose, UA >=500 (*)    Protein, ur 30 (*)    Bacteria, UA RARE (*)    Squamous Epithelial / LPF 0-5 (*)    All other components within normal limits  RAPID URINE DRUG SCREEN, HOSP PERFORMED - Abnormal; Notable for the following components:   Opiates POSITIVE (*)    Benzodiazepines POSITIVE (*)    All other components within normal limits  I-STAT CG4 LACTIC ACID, ED - Abnormal; Notable for the following components:   Lactic Acid, Venous 2.91 (*)    All other components within normal limits  I-STAT VENOUS BLOOD GAS, ED - Abnormal; Notable for the following components:   pO2, Ven 141.0 (*)    Bicarbonate 30.2 (*)    Acid-Base Excess 4.0 (*)    All other components within normal limits  I-STAT CG4 LACTIC ACID, ED - Abnormal; Notable for the following components:   Lactic Acid, Venous 2.17 (*)    All other components within normal limits  CULTURE, BLOOD (ROUTINE X 2)  CULTURE, BLOOD (ROUTINE X 2)  URINE CULTURE  CULTURE, EXPECTORATED SPUTUM-ASSESSMENT  GRAM STAIN  AMMONIA  LIPASE, BLOOD  INFLUENZA PANEL BY PCR (TYPE A & B)  LACTIC ACID, PLASMA  PROCALCITONIN  STREP  PNEUMONIAE URINARY ANTIGEN  CORTISOL-AM, BLOOD  LACTIC ACID, PLASMA  HIV ANTIBODY (ROUTINE TESTING)  LEGIONELLA PNEUMOPHILA SEROGP 1 UR AG  I-STAT TROPONIN, ED    EKG EKG Interpretation  Date/Time:  Saturday July 26 2017 17:00:33 EDT Ventricular Rate:  95 PR Interval:  156 QRS Duration: 90 QT Interval:  346 QTC Calculation: 434 R Axis:   -46 Text Interpretation:  Normal sinus rhythm Pulmonary disease pattern Left anterior fascicular block Left ventricular hypertrophy Cannot rule out Septal infarct , age undetermined Abnormal ECG When compared ti porior, faster rate.  No STEMI Confirmed by Antony Blackbird [phone]) on 07/26/2017 5:35:17 PM   Radiology Dg Chest Port 1 View  Result Date: 07/26/2017 CLINICAL DATA:  Sepsis, hypoxia EXAM: PORTABLE CHEST 1 VIEW COMPARISON:  12/22/2016 FINDINGS: Cardiomegaly. Right perihilar and left basilar airspace opacities concerning for pneumonia. No visible effusions. No acute bony abnormality. IMPRESSION: Right perihilar and left basilar opacities concerning for pneumonia. Electronically Signed   By: Rolm Baptise M.D.   On: 07/26/2017 18:50    Procedures Procedures (including critical care time)  CRITICAL CARE Performed by: Gwenyth Allegra Troi Bechtold Total critical care time: 45 minutes Critical care time was exclusive of separately billable procedures and treating other patients. Critical care was necessary to treat or prevent imminent or life-threatening deterioration. Critical care was time spent personally by me on the following activities: development of treatment plan with patient and/or surrogate as well as nursing, discussions with consultants, evaluation of patient's response to treatment, examination of patient, obtaining history from patient or surrogate, ordering and performing treatments and interventions, ordering and review of laboratory studies, ordering and review of radiographic studies, pulse oximetry and re-evaluation of  patient's  condition.   Medications Ordered in ED Medications  levofloxacin (LEVAQUIN) IVPB 750 mg (750 mg Intravenous New Bag/Given 07/26/17 1804)  aztreonam (AZACTAM) 2 g in sodium chloride 0.9 % 100 mL IVPB (has no administration in time range)  vancomycin (VANCOCIN) IVPB 1000 mg/200 mL premix (has no administration in time range)  acetaminophen (TYLENOL) suppository 650 mg (has no administration in time range)  sodium chloride 0.9 % bolus 1,000 mL (0 mLs Intravenous Stopped 07/26/17 1912)    And  sodium chloride 0.9 % bolus 1,000 mL (0 mLs Intravenous Stopped 07/26/17 1912)     Initial Impression / Assessment and Plan / ED Course  I have reviewed the triage vital signs and the nursing notes.  Pertinent labs & imaging results that were available during my care of the patient were reviewed by me and considered in my medical decision making (see chart for details).     Johnny Patrick is a 71 y.o. 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  Patient is coming by family reports that patient looks very similar to when he had multifocal pneumonia and had to be admitted for respiratory failure, encephalopathy, and sepsis.  They report that patient spit up some blood several days ago and has had a worsened cough.  The patient also was having shaking chills today and was found to have a rectal temperature of 102.9 on arrival.  Patient did not complain of headache or neck pain to family this morning.  He denies any other focal neurologic deficits.  They report that he has not complained of any nausea, vomiting, or urinary symptoms.  Patient is somnolent but will give thumbs up with both hands.  On my initial examination, patient was on room air and his action saturations were  in the 80s and dropping into the 70s.  Patient placed on nonrebreather with improvement to 100%.  Will attempt to de-escalate as possible.  Patient's lungs are extremely coarse breath sounds in all lung fields.  Patient did not have a murmur on my exam.  Chest and abdomen were nontender and patient said no that he was having any pain.  Family also denies patient had any recent chest pain.  EKG showed sinus tachycardia.  No STEMI seen.  Next  Clinically I am concerned about pneumonia and sepsis.  Code sepsis was activated after was found he was hypotensive with a blood pressure of 80 systolic, hypoxic, febrile, and tachycardic.  Suspect pneumonia as a source.  Family does report that he chronically has difficulty swallowing and choking due to esophagus problems.  Aspiration pneumonia is also considered.  Patient was given fluids and have work-up for sepsis.  Given his lack of focal neuro deficits by report as well as his lack of headache or neck pain/stiffness, do not feel patient has meningitis.  Suspect this is similar to the acute encephalopathy from his pneumonia similar to last time.  Anticipate admission for sepsis.  7:13 PM Diagnostic testing is began to return.  Lactic acid was slightly elevated at 2.91.  Initial troponin negative.  Urinalysis showed no nitrites or leukocytes, doubt UTI.  Ammonia was normal.  CBC shows no leukocytosis with mild anemia.  CMP showed elevated creatinine and AKI from prior.  Chest x-ray shows evidence of pneumonia.  On reassessment, patient was  answering questions appropriately and is awake.  Suspect the encephalopathy was due to accommodation of hypoxia and the pneumonia and dehydration.  Blood pressure is improved and is no longer hypotensive.  Patient is not tachycardic and is breathing more comfortably.  He is not hypoxic now.  Patient will be de-escalated with his oxygen from the nonrebreather.  Patient will be admitted for further management of pneumonia.    It is possible patient has had an aspiration event with his chronic choking however family do not report any specific choking events before this episode started.    Patient will be admitted for further management.   Final Clinical Impressions(s) / ED Diagnoses   Final diagnoses:  Sepsis due to pneumonia [facility])    Clinical Impression: 1. Sepsis due to pneumonia [facility])     Disposition: Admit  This note was prepared with assistance of Dragon voice recognition software. Occasional wrong-word or sound-a-like substitutions may have occurred due to the inherent limitations of voice recognition software.      Donyelle Enyeart, Gwenyth Allegra, MD 07/27/17 [phone]

## 2017-07-26 NOTE — ED Notes (Signed)
Johnny Patrick Wife  [phone] notify for any changes

## 2017-07-26 NOTE — Progress Notes (Signed)
ANTIBIOTIC CONSULT NOTE - INITIAL  Pharmacy Consult for vancomycin Indication: Pneumonia  Allergies  Allergen Reactions  . Amoxicillin-Pot Clavulanate     "liver problems" highly questionable because he was in septic shock  . Sulfur Rash    Patient Measurements:   Adjusted Body Weight: 65kg (weight from office visit 06/10/17)  Vital Signs: Temp: 98.7 F (37.1 C) (04/20 1924) Temp Source: Rectal (04/20 1924) BP: 107/48 (04/20 2030) Pulse Rate: 82 (04/20 2030) Intake/Output from previous day: No intake/output data recorded. Intake/Output from this shift: Total I/O In: 2250 [IV Piggyback:2250] Out: -   Labs: Recent Labs    07/26/17 1736  WBC 9.0  HGB 12.4*  PLT 243  CREATININE 1.62*   CrCl cannot be calculated (Unknown ideal weight.). No results for input(s): VANCOTROUGH, VANCOPEAK, VANCORANDOM, GENTTROUGH, GENTPEAK, GENTRANDOM, TOBRATROUGH, TOBRAPEAK, TOBRARND, AMIKACINPEAK, AMIKACINTROU, AMIKACIN in the last 72 hours.   Microbiology: No results found for this or any previous visit (from the past 720 hour(s)).  Medical History: Past Medical History:  Diagnosis Date  . Anemia   . Anemia, chronic disease 10/14/2011  . 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 Chronic airway obstruction, not elsewhere classified   . Cryptococcus ([facility]) 2008   left arm  . Diabetes ([facility])   . Diabetic foot ulcer ([facility]) 09/18/2016  . Esophageal reflux   . Hypothyroidism   . Low back pain   . MRSA (methicillin resistant Staphylococcus aureus)   . MSSA (methicillin susceptible Staphylococcus aureus) septicemia ([facility])    L 2nd toe-amputated June 2011  . Nephrolithiasis   . Osteoarthritis   . Psoriatic arthritis ([facility])   . Psoriatic arthritis ([facility]) 10/14/2011  . Shingles    x2  . Sleep apnea    cpap  . Unspecified essential hypertension     Assessment: 72 yo male with presumed PNA. Scr elevated to 1.62  (0.61 mg/dl 6 months ago). Likely AKI. Estimated CrCL <30 ml/min. Pharmacy asked  to dose vancomycin. Dose given in ED at 2030.    Goal of Therapy:  Vancomycin trough 15-20 mg/dl  Plan:  Vancomycin 1000mg  IV q24h Monitor renal function VT as needed Monitor fever curve, CBC, and LOT  Mallorie Norrod A. , PharmD, BCPS Clinical Pharmacist [place] Pager: [phone]  07/26/2017,8:37 PM

## 2017-07-26 NOTE — H&P (Signed)
History and Physical    [name] IBB:[id number] DOB: [birth year] DOA: 07/26/2017  Referring MD/NP/PA:   PCP: Merrilee Seashore, MD   Patient coming from:  The patient is coming from home.  At baseline, pt is independent for most of ADL.   Chief Complaint: Shortness of breath, cough, fever, chills, confusion  HPI: Johnny Patrick is a 72 y.o. male with medical history significant of hypertension, diabetes mellitus, COPD, GERD, hypothyroidism, left BKA, MRSA septicemia, cryptococcus infection in left arm, psoriatic arthritis, rheumatoid arthritis, OSA not on CPAP,CKD-2, who presents with shortness of breath, cough, fever, chills, confusion.  Per patient's wife, patient started having fever and chills today. She has productive cough with yellow colored mucus production. He has shortness of breath, but no chest pain. He is very lethargic and beomces confused today.  No unilateral weakness, numbness or tingling to extremities. Patient's wife states that the patient had a choking episode 3 days ago, and spite out a little bit of blood that time. No hemoptysis. Patient does not have nausea, vomiting, diarrhea, abdominal pain, symptoms of UTI. His mental status has improved in ED, currently patient is mildly confused, but oriented 3.  ED Course: pt was found to have WBC 9.0, ammonia 22, worsening renal function, temperature 102.3, hypotension with blood pressure 80/53, no tachypnea, has tachypnea, oxygen saturation 80s, improved to 100% on 2 L nasal cannula. Chest x-ray showed infiltration in the right perihilar and basilar area. Patient is admitted to telemetry bed as inpatient.  Review of Systems:   General: has fevers, chills, no body weight gain, has poor appetite, has fatigue HEENT: no blurry vision, hearing changes or sore throat Respiratory: has dyspnea, coughing, no wheezing CV: no chest pain, no palpitations GI: no nausea, vomiting, abdominal pain, diarrhea, constipation GU: no  dysuria, burning on urination, increased urinary frequency, hematuria  Ext: no leg edema Neuro: no unilateral weakness, numbness, or tingling, no vision change or hearing loss Skin: no rash, no skin tear. MSK: No muscle spasm, no deformity, no limitation of range of movement in spin Heme: No easy bruising.  Travel history: No recent long distant travel.  Allergy:  Allergies  Allergen Reactions  . Amoxicillin-Pot Clavulanate     "liver problems" highly questionable because he was in septic shock  . Sulfur Rash    Past Medical History:  Diagnosis Date  . Anemia   . Anemia, chronic disease 10/14/2011  . BKA stump complication ([place]) 4/50/3888  . Chronic airway obstruction, not elsewhere classified   . Cryptococcus ([place]) 2008   left arm  . Diabetes ([place])   . Diabetic foot ulcer ([place]) 09/18/2016  . Esophageal reflux   . Hypothyroidism   . Low back pain   . MRSA (methicillin resistant Staphylococcus aureus)   . MSSA (methicillin susceptible Staphylococcus aureus) septicemia ([place])    L 2nd toe-amputated June 2011  . Nephrolithiasis   . Osteoarthritis   . Psoriatic arthritis ([place])   . Psoriatic arthritis ([place]) 10/14/2011  . Shingles    x2  . Sleep apnea    cpap  . Unspecified essential hypertension     Past Surgical History:  Procedure Laterality Date  . BACK SURGERY     3 fusions, 4th - undo 3rd fusion  . THORACIC SPINE SURGERY    . TOE AMPUTATION    . TOE SURGERY  2009   little toe on right foot , bone removed  . TOTAL HIP ARTHROPLASTY     bilateral  Social History:  reports that he quit smoking about 26 years ago. His smoking use included cigarettes. He has never used smokeless tobacco. He reports that he does not drink alcohol or use drugs.  Family History:  Family History  Problem Relation Age of Onset  . Coronary artery disease Mother   . Pneumonia Mother   . Heart failure Mother   . Breast cancer Mother   . Psoriasis Father   . Coronary artery disease  Brother   . Colon cancer Neg Hx   . Stomach cancer Neg Hx      Prior to Admission medications   Medication Sig Start Date End Date Taking? Authorizing Provider  Ascorbic Acid (VITAMIN C) 500 MG CHEW Chew 500 mg by mouth daily.     Yes [provider]  calcium carbonate (CALCIUM 600) 600 MG TABS Take 600 mg by mouth 3 (three) times daily.     Yes [provider]  carvedilol (COREG) 25 MG tablet TAKE ONE TABLET TWICE DAILY 09/27/12  Yes Wall, Marijo Conception, MD  cyclobenzaprine (FLEXERIL) 10 MG tablet Take 10 mg by mouth every 8 (eight) hours as needed. For muscle pain.   Yes [provider]  fluticasone furoate-vilanterol (BREO ELLIPTA) 100-25 MCG/INH AEPB Inhale 1 puff into the lungs daily. Inhale 1 puff then rinse mouth, once daily 10/05/15  Yes Young, Clinton D, MD  folic acid (FOLVITE) 1 MG tablet Take 1 mg by mouth daily.     Yes [provider]  gabapentin (NEURONTIN) 300 MG capsule Take 1 capsule by mouth 3 (three) times daily. 12/05/16  Yes [provider]  glipiZIDE (GLUCOTROL) 5 MG tablet Take 0.5 tablets (2.5 mg total) by mouth daily before breakfast. Patient taking differently: Take 5 mg by mouth daily before breakfast.  12/24/16  Yes Regalado, Belkys A, MD  HYDROmorphone (DILAUDID) 2 MG tablet Take 2 mg by mouth every 4 (four) hours as needed for severe pain.    Yes [provider]  leflunomide (ARAVA) 10 MG tablet Take 30 mg by mouth daily.  09/21/14  Yes [provider]  levothyroxine (SYNTHROID, LEVOTHROID) 150 MCG tablet Take 150 mcg by mouth daily.    Yes [provider]  losartan (COZAAR) 50 MG tablet Take 50 mg by mouth.   Yes [provider]  Multiple Vitamin (MULITIVITAMIN WITH MINERALS) TABS Take 1 tablet by mouth daily.   Yes [provider]  Omega-3 Fatty Acids (FISH OIL) 1000 MG CAPS Take 1 capsule by mouth 3 (three) times daily.    Yes [provider]  oxyCODONE-acetaminophen  (PERCOCET) 10-325 MG tablet Take 1 to 2 tablets every 6 hours as needed for pain. 01/02/17  Yes [provider]  predniSONE (DELTASONE) 5 MG tablet Take 5 mg by mouth daily.     Yes [provider]  temazepam (RESTORIL) 15 MG capsule TAKE 1 OR 2 CAPSULES AT BEDTIME AS NEEDED FOR SLEEP. 03/26/16  Yes Young, Clinton D, MD  triamcinolone cream (KENALOG) 0.1 % Apply 1 application topically 2 (two) times daily as needed (psorisis).    Yes [provider]  amLODipine (NORVASC) 5 MG tablet Take 1 tablet (5 mg total) by mouth daily. Patient not taking: Reported on 01/29/2017 12/17/16   Dhungel, Flonnie Overman, MD  blood glucose meter kit and supplies Dispense based on patient and insurance preference. Use up to four times daily as directed. (FOR ICD-9 250.00, 250.01). 12/23/16   Regalado, Cassie Freer, MD  GLUCERNA (GLUCERNA) LIQD Take  237 mLs by mouth 2 (two) times daily between meals. Patient not taking: Reported on 01/29/2017 12/23/16   Elmarie Shiley, MD  SYMBICORT 160-4.5 MCG/ACT inhaler USE TWO PUFFS TWICE DAILY Patient not taking: Reported on 01/29/2017 04/04/15   Deneise Lever, MD    Physical Exam: Vitals:   07/26/17 1924 07/26/17 1930 07/26/17 2000 07/26/17 2030  BP:  (!) 112/54 (!) 106/55 (!) 107/48  Pulse:   80 82  Resp:  18 19 (!) 21  Temp: 98.7 F (37.1 C)     TempSrc: Rectal     SpO2:   90% 94%   General: Not in acute distress HEENT:       Eyes: PERRL, EOMI, no scleral icterus.       ENT: No discharge from the ears and nose, no pharynx injection, no tonsillar enlargement.        Neck: No JVD, no bruit, no mass felt. Heme: No neck lymph node enlargement. Cardiac: S1/S2, RRR, No murmurs, No gallops or rubs. Respiratory: has rhonchi bilaterally on auscultation. GI: Soft, nondistended, nontender, no rebound pain, no organomegaly, BS present. GU: No hematuria Ext: No pitting leg edema bilaterally. 1+DP/PT on the right leg, and s/p of R BKA. Musculoskeletal: No  joint deformities, No joint redness or warmth, no limitation of ROM in spin. Skin: No rashes.  Neuro: drowsy, but  oriented X3, cranial nerves II-XII grossly intact, moves all extremities normally.  Psych: Patient is not psychotic, no suicidal or hemocidal ideation.  Labs on Admission: I have personally reviewed following labs and imaging studies  CBC: Recent Labs  Lab 07/26/17 1736  WBC 9.0  NEUTROABS 7.6  HGB 12.4*  HCT 39.2  MCV 91.2  PLT 401   Basic Metabolic Panel: Recent Labs  Lab 07/26/17 1736  NA 137  K 4.3  CL 100*  CO2 24  GLUCOSE 240*  BUN 36*  CREATININE 1.62*  CALCIUM 10.1   GFR: CrCl cannot be calculated (Unknown ideal weight.). Liver Function Tests: Recent Labs  Lab 07/26/17 1736  AST 22  ALT 16*  ALKPHOS 76  BILITOT 0.7  PROT 6.6  ALBUMIN 3.2*   Recent Labs  Lab 07/26/17 1736  LIPASE 23   Recent Labs  Lab 07/26/17 1737  AMMONIA 22   Coagulation Profile: No results for input(s): INR, PROTIME in the last 168 hours. Cardiac Enzymes: No results for input(s): CKTOTAL, CKMB, CKMBINDEX, TROPONINI in the last 168 hours. BNP (last 3 results) No results for input(s): PROBNP in the last 8760 hours. HbA1C: No results for input(s): HGBA1C in the last 72 hours. CBG: No results for input(s): GLUCAP in the last 168 hours. Lipid Profile: No results for input(s): CHOL, HDL, LDLCALC, TRIG, CHOLHDL, LDLDIRECT in the last 72 hours. Thyroid Function Tests: No results for input(s): TSH, T4TOTAL, FREET4, T3FREE, THYROIDAB in the last 72 hours. Anemia Panel: No results for input(s): VITAMINB12, FOLATE, FERRITIN, TIBC, IRON, RETICCTPCT in the last 72 hours. Urine analysis:    Component Value Date/Time   COLORURINE AMBER (A) 07/26/2017 1741   APPEARANCEUR HAZY (A) 07/26/2017 1741   LABSPEC 1.023 07/26/2017 1741   PHURINE 5.0 07/26/2017 1741   GLUCOSEU >=500 (A) 07/26/2017 1741   HGBUR NEGATIVE 07/26/2017 1741   BILIRUBINUR NEGATIVE 07/26/2017 1741     KETONESUR NEGATIVE 07/26/2017 1741   PROTEINUR 30 (A) 07/26/2017 1741   UROBILINOGEN 0.2 12/24/2010 1016   NITRITE NEGATIVE 07/26/2017 1741   LEUKOCYTESUR NEGATIVE 07/26/2017 1741   Sepsis Labs: @LABRCNTIP (procalcitonin:4,lacticidven:4) )No results  found for this or any previous visit (from the past 240 hour(s)).   Radiological Exams on Admission: Dg Chest Port 1 View  Result Date: 07/26/2017 CLINICAL DATA:  Sepsis, hypoxia EXAM: PORTABLE CHEST 1 VIEW COMPARISON:  12/22/2016 FINDINGS: Cardiomegaly. Right perihilar and left basilar airspace opacities concerning for pneumonia. No visible effusions. No acute bony abnormality. IMPRESSION: Right perihilar and left basilar opacities concerning for pneumonia. Electronically Signed   By: Rolm Baptise M.D.   On: 07/26/2017 18:50     EKG: Independently reviewed.  Sinus rhythm, QTC 434, LAD, LAE, poor R-wave progression, nonspecific T-wave change.  Assessment/Plan Principal Problem:   Acute and chronic respiratory failure with hypoxia (HCC) Active Problems:   Hypothyroidism   Essential hypertension   Rheumatoid arthritis (HCC)   Psoriatic arthritis (HCC)   Acute metabolic encephalopathy   Type II diabetes mellitus with renal manifestations (HCC)   Aspiration pneumonia (HCC)   Acute renal failure superimposed on stage 2 chronic kidney disease (HCC)   Sepsis (HCC)   COPD with acute exacerbation (HCC)   Acute and chronic respiratory failure with hypoxia due to aspiration pneumonia and COPD exacerbation and sepsis: pt had pursued, 95% productive cough and shortness rest. Chest x-ray showed infiltration in the right perihilar and basilar area, consistent with possible aspiration pneumonia. Patient may also have copd exacerbation. Pt meets criteria for sepsis with hypotension, tachypnea, fever. Lactic acid is elevated at 2.91. Blood pressure responded to IV fluid resuscitation. Currently hemodynamically stable. He has history of MRSA septic  anemia and cryptococcus infection, will need antibiotics with broad coverage.  - will admit to tele bed as inpt - IV Vancomycin, Aztreonam and flagyl (pt also received one dose of Levaquin in ED) - Mucinex for cough  - Atrovent nebvs, and albuterol Neb prn for SOB - Urine legionella and S. pneumococcal antigen - Follow up blood culture x2, sputum culture and plus Flu pcr - will get Procalcitonin and trend lactic acid level per sepsis protocol - IVF: 2.5 of NS bolus in ED, followed by 125 mL per hour of NS  - f/u SLP  Hypothyroidism: Last TSH was 0.830 on 12/13/16 -Continue home Synthroid  Essential hypertension -hold BP med due to hypothension  Rheumatoid arthritis (HCC) and  Psoriatic arthritis ([place]) -continue Leflunomid and prednsone -solu cortef 50 mg x 1 as stress dose -check cortisone level in am.  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 Patient is taking glipizide at home -SSI  Acute renal failure superimposed on stage 2 chronic kidney disease [facility]): Baseline Cre is 0.5-0.6, pt's Cre is 1.62 and BUN 36 on admission. Likely due to prerenal secondary to dehydration and continuation of ARB - IVF as above - Follow up renal function by BMP - Hold [place]  Acute metabolic encephalopathy: likely due to sepsis and hypoxia. Currently patient has mild confusion, but oriented 3. No focal neurological findings on physical examination. - frequent neuro check  DVT ppx: SQ Lovenox Code Status: Full code Family Communication:   Yes, patient's wife at bed side Disposition Plan:  Anticipate discharge back to previous home environment Consults called:  none Admission status:   Inpatient/tele     Date of Service 07/26/2017    Ivor Costa [facility] Pager [phone]  If 7PM-7AM, please contact night-coverage [email or web address] Password [facility] 07/26/2017, 9:33 PM

## 2017-07-27 ENCOUNTER — Other Ambulatory Visit: Payer: Self-pay

## 2017-07-27 DIAGNOSIS — J9621 Acute and chronic respiratory failure with hypoxia: Secondary | ICD-10-CM

## 2017-07-27 LAB — CORTISOL-AM, BLOOD: CORTISOL - AM: 71.8 ug/dL — AB (ref 6.7–22.6)

## 2017-07-27 LAB — BASIC METABOLIC PANEL
Anion gap: 9 (ref 5–15)
BUN: 17 mg/dL (ref 6–20)
CHLORIDE: 108 mmol/L (ref 101–111)
CO2: 23 mmol/L (ref 22–32)
Calcium: 8.4 mg/dL — ABNORMAL LOW (ref 8.9–10.3)
Creatinine, Ser: 0.75 mg/dL (ref 0.61–1.24)
Glucose, Bld: 129 mg/dL — ABNORMAL HIGH (ref 65–99)
POTASSIUM: 4.1 mmol/L (ref 3.5–5.1)
SODIUM: 140 mmol/L (ref 135–145)

## 2017-07-27 LAB — CBG MONITORING, ED
GLUCOSE-CAPILLARY: 121 mg/dL — AB (ref 65–99)
Glucose-Capillary: 118 mg/dL — ABNORMAL HIGH (ref 65–99)

## 2017-07-27 LAB — URINE CULTURE: Culture: NO GROWTH

## 2017-07-27 LAB — HIV ANTIBODY (ROUTINE TESTING W REFLEX): HIV Screen 4th Generation wRfx: NONREACTIVE

## 2017-07-27 LAB — GLUCOSE, CAPILLARY
GLUCOSE-CAPILLARY: 112 mg/dL — AB (ref 65–99)
GLUCOSE-CAPILLARY: 124 mg/dL — AB (ref 65–99)

## 2017-07-27 LAB — LACTIC ACID, PLASMA: Lactic Acid, Venous: 1.2 mmol/L (ref 0.5–1.9)

## 2017-07-27 LAB — MRSA PCR SCREENING: MRSA BY PCR: NEGATIVE

## 2017-07-27 MED ORDER — PIPERACILLIN-TAZOBACTAM 3.375 G IVPB
3.3750 g | Freq: Three times a day (TID) | INTRAVENOUS | Status: DC
  Administered 2017-07-27 – 2017-07-29 (×6): 3.375 g via INTRAVENOUS
  Filled 2017-07-27 (×8): qty 50

## 2017-07-27 MED ORDER — LOSARTAN POTASSIUM 50 MG PO TABS
50.0000 mg | ORAL_TABLET | Freq: Every day | ORAL | Status: DC
  Administered 2017-07-27 – 2017-07-29 (×3): 50 mg via ORAL
  Filled 2017-07-27 (×3): qty 1

## 2017-07-27 MED ORDER — VANCOMYCIN HCL IN DEXTROSE 750-5 MG/150ML-% IV SOLN
750.0000 mg | Freq: Two times a day (BID) | INTRAVENOUS | Status: DC
  Administered 2017-07-27 – 2017-07-28 (×2): 750 mg via INTRAVENOUS
  Filled 2017-07-27 (×3): qty 150

## 2017-07-27 MED ORDER — IPRATROPIUM BROMIDE 0.02 % IN SOLN
0.5000 mg | Freq: Four times a day (QID) | RESPIRATORY_TRACT | Status: DC
  Administered 2017-07-28 (×4): 0.5 mg via RESPIRATORY_TRACT
  Filled 2017-07-27 (×4): qty 2.5

## 2017-07-27 MED ORDER — MORPHINE SULFATE (PF) 4 MG/ML IV SOLN
1.0000 mg | INTRAVENOUS | Status: DC | PRN
Start: 2017-07-27 — End: 2017-07-29
  Administered 2017-07-27 – 2017-07-29 (×4): 1 mg via INTRAVENOUS
  Filled 2017-07-27 (×4): qty 1

## 2017-07-27 MED ORDER — ORAL CARE MOUTH RINSE
15.0000 mL | Freq: Two times a day (BID) | OROMUCOSAL | Status: DC
  Administered 2017-07-27 – 2017-07-28 (×3): 15 mL via OROMUCOSAL

## 2017-07-27 MED ORDER — CARVEDILOL 25 MG PO TABS
25.0000 mg | ORAL_TABLET | Freq: Two times a day (BID) | ORAL | Status: DC
  Administered 2017-07-27 – 2017-07-29 (×5): 25 mg via ORAL
  Filled 2017-07-27 (×5): qty 1

## 2017-07-27 NOTE — ED Notes (Signed)
Pt on room air with saturation of 97%

## 2017-07-27 NOTE — ED Notes (Signed)
Attending at bedside.

## 2017-07-27 NOTE — Progress Notes (Addendum)
PROGRESS NOTE    Johnny Patrick  UOH:[id number] DOB: [birth year] DOA: 07/26/2017 PCP: Georgianne Fick, MD     Brief Narrative:  Johnny Patrick is a 72 y.o. male with medical history significant of hypertension, diabetes mellitus, COPD, GERD, hypothyroidism, left BKA, MRSA septicemia, cryptococcus infection in left arm, psoriatic arthritis, rheumatoid arthritis, OSA not on CPAP, CKD stage 2, who presents with shortness of breath, cough, fever, chills, confusion. Per patient's wife, patient started having fever and chills today. He has a productive cough with yellow colored mucus production. He has shortness of breath, but no chest pain. He is very lethargic and became confused today.  No unilateral weakness, numbness or tingling to extremities. Patient's wife states that the patient had a choking episode 3 days ago, and spite out a little bit of blood that time.  In the emergency department, he was febrile 102.3 with hypotension blood pressure 80/53 with O2 sat in the 80s on room air.  Chest x-ray revealed right perihilar and left basilar infiltration.  Patient was admitted for aspiration pneumonia, HCAP, AKI.  Assessment & Plan:   Principal Problem:   Acute and chronic respiratory failure with hypoxia (HCC) Active Problems:   Hypothyroidism   Essential hypertension   Rheumatoid arthritis (HCC)   Psoriatic arthritis (HCC)   Acute metabolic encephalopathy   Type II diabetes mellitus with renal manifestations (HCC)   Aspiration pneumonia (HCC)   Acute renal failure superimposed on stage 2 chronic kidney disease (HCC)   Sepsis (HCC)   COPD with acute exacerbation (HCC)   Acute and chronic respiratory failure with hypoxia due to aspiration pneumonia, HCAP -Continue [place] O2 and wean to room air as able   Sepsis secondary to aspiration pneumonia, HCAP  -CXR: infiltrate in right perihilar and left basilar area, consistent with possible aspiration pneumonia -Procalcitonin 1.74  -Influenza  negative  -Continue IV Vancomycin, Zosyn  -Check MRSA PCR, if negative can DC vanco  -SLP eval for aspiration risk   Hypothyroidism -Continue home Synthroid  Essential hypertension -BP improved and now hypertensive, resume home coreg and cozaar   Rheumatoid arthritis and Psoriatic arthritis -Continue Leflunomid and prednisone -Solu cortef 50 mg x 1 as stress dose in ED  DM type 2 with renal complications of CKD stage II -Hold glipizide -SSI  AKI on CKD stage 2 -IVF and resolved Cr back to baseline   DVT prophylaxis: Lovenox Code Status: Full Family Communication: No family at bedside Disposition Plan: Pending improvement in clinical status, sepsis.    Consultants:   None  Procedures:   None   Antimicrobials:  Anti-infectives (From admission, onward)   Start     Dose/Rate Route Frequency Ordered Stop   07/27/17 2030  vancomycin (VANCOCIN) IVPB 1000 mg/200 mL premix     1,000 mg 200 mL/hr over 60 Minutes Intravenous Every 24 hours 07/26/17 2044     07/27/17 0400  aztreonam (AZACTAM) 1 g in sodium chloride 0.9 % 100 mL IVPB     1 g 200 mL/hr over 30 Minutes Intravenous Every 8 hours 07/26/17 2017 08/04/17 0559   07/26/17 2200  aztreonam (AZACTAM) 1 g in sodium chloride 0.9 % 100 mL IVPB  Status:  Discontinued     1 g 200 mL/hr over 30 Minutes Intravenous Every 8 hours 07/26/17 2004 07/26/17 2017   07/26/17 2015  metroNIDAZOLE (FLAGYL) IVPB 500 mg     500 mg 100 mL/hr over 60 Minutes Intravenous Every 8 hours 07/26/17 2004     07/26/17  1745  levofloxacin (LEVAQUIN) IVPB 750 mg     750 mg 100 mL/hr over 90 Minutes Intravenous  Once 07/26/17 1737 07/26/17 1946   07/26/17 1745  aztreonam (AZACTAM) 2 g in sodium chloride 0.9 % 100 mL IVPB     2 g 200 mL/hr over 30 Minutes Intravenous  Once 07/26/17 1737 07/26/17 2028   07/26/17 1745  vancomycin (VANCOCIN) IVPB 1000 mg/200 mL premix     1,000 mg 200 mL/hr over 60 Minutes Intravenous  Once 07/26/17 1737  07/26/17 2129       Subjective: Patient complaining of fever and chills.  Denies any shortness of breath on my examination.  Denies any chest pain, nausea, vomiting or abdominal pain.  Objective: Vitals:   07/27/17 0815 07/27/17 1058 07/27/17 1200 07/27/17 1346  BP: (!) 124/96 (!) 159/93 (!) 167/83 (!) 189/93  Pulse:  73 70 77  Resp: 18 14 (!) 21   Temp:    97.6 F (36.4 C)  TempSrc:    Oral  SpO2:  98% 97% 90%  Weight:    63.4 kg (139 lb 12.4 oz)  Height:    5\' 6"  (1.676 m)    Intake/Output Summary (Last 24 hours) at 07/27/2017 1402 Last data filed at 07/27/2017 1304 Gross per 24 hour  Intake 4820.83 ml  Output 350 ml  Net 4470.83 ml   Filed Weights   07/26/17 2242 07/27/17 1346  Weight: 64.4 kg (142 lb) 63.4 kg (139 lb 12.4 oz)    Examination:  General exam: Appears calm and comfortable  Respiratory system: Diminished breath sounds bilaterally.  Respiratory effort normal. Cardiovascular system: S1 & S2 heard, RRR. No JVD, murmurs, rubs, gallops or clicks. No pedal edema. Gastrointestinal system: Abdomen is nondistended, soft and nontender. No organomegaly or masses felt. Normal bowel sounds heard. Central nervous system: Alert and oriented. No focal neurological deficits. Extremities: + Left BKA Skin: No rashes, lesions or ulcers Psychiatry: Judgement and insight appear normal. Mood & affect appropriate.   Data Reviewed: I have personally reviewed following labs and imaging studies  CBC: Recent Labs  Lab 07/26/17 1736  WBC 9.0  NEUTROABS 7.6  HGB 12.4*  HCT 39.2  MCV 91.2  PLT 243   Basic Metabolic Panel: Recent Labs  Lab 07/26/17 1736 07/27/17 1105  NA 137 140  K 4.3 4.1  CL 100* 108  CO2 24 23  GLUCOSE 240* 129*  BUN 36* 17  CREATININE 1.62* 0.75  CALCIUM 10.1 8.4*   GFR: Estimated Creatinine Clearance: 72.6 mL/min (by C-G formula based on SCr of 0.75 mg/dL). Liver Function Tests: Recent Labs  Lab 07/26/17 1736  AST 22  ALT 16*  ALKPHOS  76  BILITOT 0.7  PROT 6.6  ALBUMIN 3.2*   Recent Labs  Lab 07/26/17 1736  LIPASE 23   Recent Labs  Lab 07/26/17 1737  AMMONIA 22   Coagulation Profile: No results for input(s): INR, PROTIME in the last 168 hours. Cardiac Enzymes: No results for input(s): CKTOTAL, CKMB, CKMBINDEX, TROPONINI in the last 168 hours. BNP (last 3 results) No results for input(s): PROBNP in the last 8760 hours. HbA1C: No results for input(s): HGBA1C in the last 72 hours. CBG: Recent Labs  Lab 07/27/17 0043 07/27/17 1100  GLUCAP 118* 121*   Lipid Profile: No results for input(s): CHOL, HDL, LDLCALC, TRIG, CHOLHDL, LDLDIRECT in the last 72 hours. Thyroid Function Tests: No results for input(s): TSH, T4TOTAL, FREET4, T3FREE, THYROIDAB in the last 72 hours. Anemia Panel: No results  for input(s): VITAMINB12, FOLATE, FERRITIN, TIBC, IRON, RETICCTPCT in the last 72 hours. Sepsis Labs: Recent Labs  Lab 07/26/17 1804 07/26/17 1957 07/26/17 2053 07/27/17 0100  PROCALCITON  --   --  1.74  --   LATICACIDVEN 2.91* 2.17* 1.7 1.2    No results found for this or any previous visit (from the past 240 hour(s)).     Radiology Studies: Dg Chest Port 1 View  Result Date: 07/26/2017 CLINICAL DATA:  Sepsis, hypoxia EXAM: PORTABLE CHEST 1 VIEW COMPARISON:  12/22/2016 FINDINGS: Cardiomegaly. Right perihilar and left basilar airspace opacities concerning for pneumonia. No visible effusions. No acute bony abnormality. IMPRESSION: Right perihilar and left basilar opacities concerning for pneumonia. Electronically Signed   By: Charlett Nose M.D.   On: 07/26/2017 18:50      Scheduled Meds: . calcium carbonate  625 mg Oral TID WC  . carvedilol  25 mg Oral BID  . enoxaparin (LOVENOX) injection  40 mg Subcutaneous Q24H  . fluticasone furoate-vilanterol  1 puff Inhalation Daily  . folic acid  1 mg Oral Daily  . gabapentin  300 mg Oral TID  . insulin aspart  0-5 Units Subcutaneous QHS  . insulin aspart  0-9  Units Subcutaneous TID WC  . ipratropium  0.5 mg Nebulization Q4H  . leflunomide  30 mg Oral Daily  . levothyroxine  150 mcg Oral QAC breakfast  . losartan  50 mg Oral Daily  . multivitamin with minerals  1 tablet Oral Daily  . omega-3 acid ethyl esters  1 g Oral Daily  . predniSONE  5 mg Oral Daily  . vitamin C  500 mg Oral Daily   Continuous Infusions: . sodium chloride 125 mL/hr at 07/27/17 1107  . aztreonam Stopped (07/27/17 0444)  . metronidazole 500 mg (07/27/17 1304)  . vancomycin       LOS: 1 day    Time spent: 35 minutes   Noralee Stain, DO [facility] [email or web address] Password [email or web address] 07/27/2017, 2:02 PM

## 2017-07-27 NOTE — Progress Notes (Addendum)
ANTIBIOTIC CONSULT NOTE  Pharmacy Consult for vancomycin/zosyn Indication: Pneumonia  Allergies  Allergen Reactions  . Amoxicillin-Pot Clavulanate     "liver problems" highly questionable because he was in septic shock  . Sulfur Rash    Patient Measurements: Height: 5\' 6"  (167.6 cm) Weight: 139 lb 12.4 oz (63.4 kg) IBW/kg (Calculated) : 63.8 Adjusted Body Weight: 65kg (weight from office visit 06/10/17)  Vital Signs: Temp: 97.6 F (36.4 C) (04/21 1346) Temp Source: Oral (04/21 1346) BP: 189/93 (04/21 1346) Pulse Rate: 77 (04/21 1346) Intake/Output from previous day: 04/20 0701 - 04/21 0700 In: 3250 [IV Piggyback:3250] Out: -  Intake/Output from this shift: Total I/O In: 1570.8 [I.V.:1470.8; IV Piggyback:100] Out: 350 [Emesis/NG output:350]  Labs: Recent Labs    07/26/17 1736 07/27/17 1105  WBC 9.0  --   HGB 12.4*  --   PLT 243  --   CREATININE 1.62* 0.75   Estimated Creatinine Clearance: 72.6 mL/min (by C-G formula based on SCr of 0.75 mg/dL). No results for input(s): VANCOTROUGH, VANCOPEAK, VANCORANDOM, GENTTROUGH, GENTPEAK, GENTRANDOM, TOBRATROUGH, TOBRAPEAK, TOBRARND, AMIKACINPEAK, AMIKACINTROU, AMIKACIN in the last 72 hours.   Microbiology: No results found for this or any previous visit (from the past 720 hour(s)).  Medical History: Past Medical History:  Diagnosis Date  . Anemia   . Anemia, chronic disease 10/14/2011  . 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 Chronic airway obstruction, not elsewhere classified   . Cryptococcus (HCC) 2008   left arm  . Diabetes (HCC)   . Diabetic foot ulcer (HCC) 09/18/2016  . Esophageal reflux   . Hypothyroidism   . Low back pain   . MRSA (methicillin resistant Staphylococcus aureus)   . MSSA (methicillin susceptible Staphylococcus aureus) septicemia (HCC)    L 2nd toe-amputated June 2011  . Nephrolithiasis   . Osteoarthritis   . Psoriatic arthritis (HCC)   . Psoriatic arthritis (HCC) 10/14/2011  . Shingles    x2  . Sleep apnea    cpap  . Unspecified essential hypertension     Assessment: 72 yo male with presumed aspiration PNA. Scr decreased from 1.62 yesterday 0.75 (0.61 mg/dl 6 months ago). Estimated CrCL <70 ml/min. Patient was on aztreonam and metronidazole. Per discussion with MD, will discontinue and start zosyn instead. Patient has no documented true PCN allergy, only a questionable side effect, and has received cephalosporins and carbapenems in the past.   Per MD, if MRSA PCR comes back negative, can d/c the vancomycin  Goal of Therapy:  Vancomycin trough 15-20 mg/dl  Plan:  Adjust Vancomycin 750mg  IV q12h for improved renal function Start Zosyn 3.375 g q8h Monitor renal function Follow-up on MRSA PCR VT as needed if still requires vanc Monitor fever curve and LOT  66, PharmD Pharmacy Resident Pager: [phone]

## 2017-07-27 NOTE — ED Notes (Signed)
Placed on pt hospital bed for comfort.

## 2017-07-27 NOTE — Evaluation (Signed)
Clinical/Bedside Swallow Evaluation Patient Details  Name: Johnny Patrick MRN: [id number] Date of Birth: 1945/04/27  Today's Date: 07/27/2017 Time: SLP Start Time (ACUTE ONLY): 1417 SLP Stop Time (ACUTE ONLY): 1500 SLP Time Calculation (min) (ACUTE ONLY): 43 min  Past Medical History:  Past Medical History:  Diagnosis Date  . Anemia   . Anemia, chronic disease 10/14/2011  . 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 Chronic airway obstruction, not elsewhere classified   . Cryptococcus (HCC) 2008   left arm  . Diabetes (HCC)   . Diabetic foot ulcer (HCC) 09/18/2016  . Esophageal reflux   . Hypothyroidism   . Low back pain   . MRSA (methicillin resistant Staphylococcus aureus)   . MSSA (methicillin susceptible Staphylococcus aureus) septicemia (HCC)    L 2nd toe-amputated June 2011  . Nephrolithiasis   . Osteoarthritis   . Psoriatic arthritis (HCC)   . Psoriatic arthritis (HCC) 10/14/2011  . Shingles    x2  . Sleep apnea    cpap  . Unspecified essential hypertension    Past Surgical History:  Past Surgical History:  Procedure Laterality Date  . BACK SURGERY     3 fusions, 4th - undo 3rd fusion  . THORACIC SPINE SURGERY    . TOE AMPUTATION    . TOE SURGERY  2009   little toe on right foot , bone removed  . TOTAL HIP ARTHROPLASTY     bilateral   HPI:  Johnny Patrick is a 72 y.o male with medical history significant of hypertension, diabetes mellitus, COPD, GERD, hypothyroidism, left BKA, MRSA septicemia, cryptococcus infection in left arm, psoriatic arthritis, rheumatoid arthritis, OSA not on CPAP, CKD stage 2, who presents with shortness of breath, cough, fever, chills, confusion. Per patient's wife, patient started having fever and chills. He has a productive cough with yellow colored mucus production. He has shortness of breath, but no chest pain. He is very lethargic and became confused. No unilateral weakness, numbness or tingling to extremities. Patient's wife states that  the patient had a choking episode 3 days ago, and spite out a little bit of blood that time.  In the emergency department, he was febrile 102.3 with hypotension blood pressure 80/53 with O2 sat in the 80s on room air.  Chest x-ray revealed right perihilar and left basilar infiltration.  Patient was admitted for aspiration pneumonia, HCAP, AKI.   Assessment / Plan / Recommendation Clinical Impression   Pt with history of chronic mild-moderate pharyngeal dysphagia (see previous swallow assessment section below) and probably chronic low grade aspiration. Pt reports he feels he aspirated after having a piece of pork chop stuck in his throat for 24 hours. Pt reportedly received home health ST for dysphagia after d/c in 12/2106. Pt reports completing exercises and was told to tuck his chin when swallowing (this was not consisently effective per MBS), as well as cough volitionally and swallow multiple times. Education provided that pt likely continuing to have aspiration events aside from his episode with the pork chop, and that if pt follows strategies his risk decreases but is not fully eliminated. Reinforced importance of following precautions due to probable decreased tolerance for aspiration events given current PNA and encouraged pt/daughter to complete regular oral care (prior to and after meals optimal) to minimize oral bacterial load. Pt admits he does not complete regular oral hygiene; he has poor dentition and has lost several teeth recently. He states this has impacted his chewing and intends to start putting tough  meats in his food processor. With single sips and cued cough, reswallow, there are no overt signs of aspiration with thin liquids, purees or regular solids; though pt reports difficulty chewing and sensation of cracker sticking, requiring multiple swallows. Subtle throat clearing noted when pt taking larger, rapid bites of puree. Recommend initiating dys 2, thin liquids, with compensations and  aspiration precautions. Meds whole in puree. D/w RN. Will follow up for tolerance, possible advancement of solids, and further education/reinforcement of precautions.   SLP Visit Diagnosis: Dysphagia, pharyngeal phase (R13.13)    Aspiration Risk  Moderate aspiration risk    Diet Recommendation Dysphagia 2 (Fine chop);Thin liquid   Liquid Administration via: Cup;Straw Medication Administration: Whole meds with puree Supervision: Patient able to self feed;Intermittent supervision to cue for compensatory strategies Compensations: Minimize environmental distractions;Slow rate;Small sips/bites;Hard cough after swallow;Multiple dry swallows after each bite/sip Postural Changes: Seated upright at 90 degrees    Other  Recommendations Oral Care Recommendations: Oral care before and after PO   Follow up Recommendations Other (comment)(tba)      Frequency and Duration min 2x/week  1 week       Prognosis Prognosis for Safe Diet Advancement: Good Barriers to Reach Goals: Other (Comment)(dentition)      Swallow Study   General Date of Onset: 07/26/17 HPI: JOURDAN DURBIN is a 72 y.o male with medical history significant of hypertension, diabetes mellitus, COPD, GERD, hypothyroidism, left BKA, MRSA septicemia, cryptococcus infection in left arm, psoriatic arthritis, rheumatoid arthritis, OSA not on CPAP, CKD stage 2, who presents with shortness of breath, cough, fever, chills, confusion. Per patient's wife, patient started having fever and chills. He has a productive cough with yellow colored mucus production. He has shortness of breath, but no chest pain. He is very lethargic and became confused. No unilateral weakness, numbness or tingling to extremities. Patient's wife states that the patient had a choking episode 3 days ago, and spite out a little bit of blood that time.  In the emergency department, he was febrile 102.3 with hypotension blood pressure 80/53 with O2 sat in the 80s on room air.   Chest x-ray revealed right perihilar and left basilar infiltration.  Patient was admitted for aspiration pneumonia, HCAP, AKI. Type of Study: Bedside Swallow Evaluation Previous Swallow Assessment: MBS 12/20/16 showed mild-moderate pharyngeal dysphagia due in part to osteophytes C2-5 that are not excessively large, but impinge sufficiently into pharyngeal space such that epiglottic inversion over larynx is incomplete.  Pt presents also with base of tongue weakness and decreased laryngeal elevation, which lead to bolus retention in the vallecular and pyriform spaces.  These issues lead to intermittent, trace aspiration of thin liquids and deep penetration of nectar-thick liquids to the vocal folds.  Aspiration is accompanied by a consistent cough. Diet Prior to this Study: NPO Temperature Spikes Noted: Yes(102.3) Respiratory Status: Room air History of Recent Intubation: No Behavior/Cognition: Alert;Cooperative;Pleasant mood Oral Cavity Assessment: Within Functional Limits Oral Care Completed by SLP: Yes Oral Cavity - Dentition: Missing dentition;Poor condition Vision: Functional for self-feeding Self-Feeding Abilities: Able to feed self Patient Positioning: Upright in bed Baseline Vocal Quality: Hoarse Volitional Cough: Strong Volitional Swallow: Able to elicit    Oral/Motor/Sensory Function Overall Oral Motor/Sensory Function: Within functional limits   Ice Chips Ice chips: Within functional limits Presentation: Spoon   Thin Liquid Thin Liquid: Within functional limits Presentation: Cup    Nectar Thick Nectar Thick Liquid: Not tested   Honey Thick Honey Thick Liquid: Not tested   Puree  Puree: Impaired Pharyngeal Phase Impairments: Throat Clearing - Delayed Other Comments: with larger consecutive bites   Solid   GO   Solid: Impaired Pharyngeal Phase Impairments: Multiple swallows       Rondel Baton, MS, CCC-SLP Speech-Language Pathologist [phone]   Arlana Lindau 07/27/2017,3:10 PM

## 2017-07-27 NOTE — ED Notes (Signed)
Pt placed on 2L simple mask d/t mouth breathing while sleeping. SpO2 sats dropped into high 80s.

## 2017-07-28 LAB — BASIC METABOLIC PANEL
ANION GAP: 8 (ref 5–15)
BUN: 11 mg/dL (ref 6–20)
CALCIUM: 7.9 mg/dL — AB (ref 8.9–10.3)
CO2: 22 mmol/L (ref 22–32)
Chloride: 106 mmol/L (ref 101–111)
Creatinine, Ser: 0.69 mg/dL (ref 0.61–1.24)
Glucose, Bld: 86 mg/dL (ref 65–99)
Potassium: 3.1 mmol/L — ABNORMAL LOW (ref 3.5–5.1)
Sodium: 136 mmol/L (ref 135–145)

## 2017-07-28 LAB — CBC
HEMATOCRIT: 29.1 % — AB (ref 39.0–52.0)
Hemoglobin: 9.4 g/dL — ABNORMAL LOW (ref 13.0–17.0)
MCH: 29.1 pg (ref 26.0–34.0)
MCHC: 32.3 g/dL (ref 30.0–36.0)
MCV: 90.1 fL (ref 78.0–100.0)
Platelets: 194 10*3/uL (ref 150–400)
RBC: 3.23 MIL/uL — ABNORMAL LOW (ref 4.22–5.81)
RDW: 15.6 % — AB (ref 11.5–15.5)
WBC: 11.1 10*3/uL — AB (ref 4.0–10.5)

## 2017-07-28 LAB — GLUCOSE, CAPILLARY
GLUCOSE-CAPILLARY: 137 mg/dL — AB (ref 65–99)
GLUCOSE-CAPILLARY: 173 mg/dL — AB (ref 65–99)
Glucose-Capillary: 107 mg/dL — ABNORMAL HIGH (ref 65–99)
Glucose-Capillary: 197 mg/dL — ABNORMAL HIGH (ref 65–99)
Glucose-Capillary: 110 mg/dL — ABNORMAL HIGH (ref 65–99)

## 2017-07-28 LAB — LEGIONELLA PNEUMOPHILA SEROGP 1 UR AG: L. pneumophila Serogp 1 Ur Ag: NEGATIVE

## 2017-07-28 MED ORDER — POTASSIUM CHLORIDE CRYS ER 20 MEQ PO TBCR
40.0000 meq | EXTENDED_RELEASE_TABLET | ORAL | Status: AC
  Administered 2017-07-28 (×2): 40 meq via ORAL
  Filled 2017-07-28 (×2): qty 2

## 2017-07-28 MED ORDER — CYCLOBENZAPRINE HCL 10 MG PO TABS
10.0000 mg | ORAL_TABLET | Freq: Three times a day (TID) | ORAL | Status: DC | PRN
  Administered 2017-07-28 (×2): 10 mg via ORAL
  Filled 2017-07-28 (×2): qty 1

## 2017-07-28 MED ORDER — IPRATROPIUM BROMIDE 0.02 % IN SOLN
0.5000 mg | Freq: Three times a day (TID) | RESPIRATORY_TRACT | Status: DC
  Administered 2017-07-29 (×2): 0.5 mg via RESPIRATORY_TRACT
  Filled 2017-07-28 (×2): qty 2.5

## 2017-07-28 MED ORDER — OXYCODONE-ACETAMINOPHEN 10-325 MG PO TABS: 1.0000 | ORAL_TABLET | Freq: Four times a day (QID) | ORAL | Status: DC | PRN

## 2017-07-28 MED ORDER — HYDROMORPHONE HCL 2 MG PO TABS
2.0000 mg | ORAL_TABLET | ORAL | Status: DC | PRN
Start: 2017-07-28 — End: 2017-07-29
  Administered 2017-07-28 – 2017-07-29 (×2): 2 mg via ORAL
  Filled 2017-07-28 (×3): qty 1

## 2017-07-28 NOTE — Progress Notes (Signed)
  Speech Language Pathology Treatment: Dysphagia  Patient Details Name: Johnny Patrick MRN: [id number] DOB: Aug 30, 1945 Today's Date: 07/28/2017 Time: 9485-4627 SLP Time Calculation (min) (ACUTE ONLY): 19 min  Assessment / Plan / Recommendation Clinical Impression  Pt recalled 2 primary strategies from SLP's education during yesterday's bedside swallow assessment. Observed with minced roast beef with prolonged mastication (meat was tender). Reiterated importance of mincing meats at home (he has food processor) or even puree if needed. He required min verbal reminders to use strategy during po intake (swallow, cough, swallow) and coughed x 2 which he described as being reflexive versus his swallow strategy. Discussed chronic aspiration risk and need for vigilance during times of illness. Recommend continue D2 texture, thin liquids.    HPI HPI: Johnny Patrick is a 72 y.o male with medical history significant of hypertension, diabetes mellitus, COPD, GERD, hypothyroidism, left BKA, MRSA septicemia, cryptococcus infection in left arm, psoriatic arthritis, rheumatoid arthritis, OSA not on CPAP, CKD stage 2, who presents with shortness of breath, cough, fever, chills, confusion. Per MD note, wife states that the patient had a choking episode 3 days ago, and spite out a little bit of blood that time. Pt reported to evaluating SLP he had a "pork chop stuck in my throat for 24 hours."   Chest x-ray revealed right perihilar and left basilar infiltration. Patient was admitted for aspiration pneumonia, HCAP, AKI.      SLP Plan  Continue with current plan of care       Recommendations  Diet recommendations: Dysphagia 2 (fine chop);Thin liquid Liquids provided via: Cup Medication Administration: Whole meds with puree Supervision: Patient able to self feed Compensations: Minimize environmental distractions;Slow rate;Small sips/bites;Hard cough after swallow;Multiple dry swallows after each bite/sip Postural  Changes and/or Swallow Maneuvers: Seated upright 90 degrees                Oral Care Recommendations: Oral care BID;Oral care before and after PO Follow up Recommendations: None SLP Visit Diagnosis: Dysphagia, pharyngeal phase (R13.13) Plan: Continue with current plan of care       GO                Royce Macadamia 07/28/2017, 2:04 PM   Breck Coons Lonell Face.Ed [employer] [phone]

## 2017-07-28 NOTE — Evaluation (Signed)
Physical Therapy Evaluation Patient Details Name: Johnny Patrick MRN: [id number] DOB: 1945/12/12 Today's Date: 07/28/2017   History of Present Illness  RUBERT FREDIANI is a 72 y.o. male with medical history significant of hypertension, diabetes mellitus, COPD, GERD, hypothyroidism, left BKA, MRSA septicemia, cryptococcus infection in left arm, psoriatic arthritis, rheumatoid arthritis, OSA not on CPAP, CKD stage 2, who presents with shortness of breath, cough, fever, chills, confusion. Per patient's wife, patient started having fever and chills today. He has a productive cough with yellow colored mucus production. Patient was admitted for aspiration pneumonia, HCAP, AKI.  Clinical Impression  Pt admitted with above diagnosis. Pt currently with functional limitations due to the deficits listed below (see PT Problem List). Pt was able to squat pivot with min assist to chair.  Did not have prosthetic but wife to bring.  He has assist by wife and home and feels that he will be fine with his wife's care once medically ready.  Has all equipment needed at home.  Pt on 2LO2 and sats on RA 89% therefore replaced O2 at 2L at end of treatment. Pt will benefit from skilled PT to increase their independence and safety with mobility to allow discharge to the venue listed below.      Follow Up Recommendations Home health PT;Supervision/Assistance - 24 hour    Equipment Recommendations  None recommended by PT    Recommendations for Other Services       Precautions / Restrictions Precautions Precautions: Fall Required Braces or Orthoses: Other Brace/Splint(left LE prosthesis - pt did not bring it with him) Restrictions Weight Bearing Restrictions: No      Mobility  Bed Mobility Overal bed mobility: Needs Assistance Bed Mobility: Supine to Sit     Supine to sit: Mod assist;+2 for physical assistance     General bed mobility comments: Pt pulled up on PT to come to EOB.    Transfers Overall  transfer level: Needs assistance Equipment used: 2 person hand held assist Transfers: Squat Pivot Transfers     Squat pivot transfers: Min assist;+2 physical assistance     General transfer comment: Needed min asssist to transfer to recliner.  Pt needed a little assist with hand placement.   Ambulation/Gait                Stairs            Wheelchair Mobility    Modified Rankin (Stroke Patients Only)       Balance Overall balance assessment: Needs assistance Sitting-balance support: Feet supported;Bilateral upper extremity supported Sitting balance-Leahy Scale: Poor Sitting balance - Comments: Needed assist as he had slight posterior lean and took incr time to get pt to eOB.  Postural control: Posterior lean Standing balance support: Bilateral upper extremity supported;During functional activity Standing balance-Leahy Scale: Poor Standing balance comment: Did not stand fully upright as he did not have his left LE prosthetic                             Pertinent Vitals/Pain Pain Assessment: No/denies pain    Home Living Family/patient expects to be discharged to:: Private residence Living Arrangements: Spouse/significant other Available Help at Discharge: Family;Available 24 hours/day Type of Home: House Home Access: Stairs to enter Entrance Stairs-Rails: None Entrance Stairs-Number of Steps: 2 Home Layout: One level;Laundry or work area in [employer] Equipment: Environmental consultant - 2 wheels;Cane - single point;Bedside commode;Shower seat;Adaptive equipment;Walker - 4 wheels;Tub bench  Prior Function Level of Independence: Independent with assistive device(s)         Comments: uses rollator vs RW for ambulation per pt     Hand Dominance        Extremity/Trunk Assessment   Upper Extremity Assessment Upper Extremity Assessment: Defer to OT evaluation    Lower Extremity Assessment Lower Extremity Assessment: Generalized weakness     Cervical / Trunk Assessment Cervical / Trunk Assessment: Kyphotic  Communication   Communication: No difficulties  Cognition Arousal/Alertness: Awake/alert Behavior During Therapy: WFL for tasks assessed/performed Overall Cognitive Status: Within Functional Limits for tasks assessed                                        General Comments      Exercises     Assessment/Plan    PT Assessment Patient needs continued PT services  PT Problem List Decreased activity tolerance;Decreased balance;Decreased mobility;Decreased knowledge of use of DME;Decreased safety awareness;Decreased knowledge of precautions       PT Treatment Interventions DME instruction;Gait training;Functional mobility training;Therapeutic activities;Therapeutic exercise;Stair training;Balance training;Patient/family education    PT Goals (Current goals can be found in the Care Plan section)  Acute Rehab PT Goals Patient Stated Goal: to  go home PT Goal Formulation: With patient Time For Goal Achievement: 08/11/17 Potential to Achieve Goals: Good    Frequency Min 3X/week   Barriers to discharge        Co-evaluation               AM-PAC PT "6 Clicks" Daily Activity  Outcome Measure Difficulty turning over in bed (including adjusting bedclothes, sheets and blankets)?: A Little Difficulty moving from lying on back to sitting on the side of the bed? : A Lot Difficulty sitting down on and standing up from a chair with arms (e.g., wheelchair, bedside commode, etc,.)?: A Lot Help needed moving to and from a bed to chair (including a wheelchair)?: A Little Help needed walking in hospital room?: Total Help needed climbing 3-5 steps with a railing? : Total 6 Click Score: 12    End of Session Equipment Utilized During Treatment: Gait belt;Oxygen Activity Tolerance: Patient limited by fatigue Patient left: in chair;with call bell/phone within reach;with chair alarm set Nurse  Communication: Mobility status PT Visit Diagnosis: Unsteadiness on feet (R26.81);Muscle weakness (generalized) (M62.81)    Time: 9702-6378 PT Time Calculation (min) (ACUTE ONLY): 12 min   Charges:   PT Evaluation $PT Eval Moderate Complexity: 1 Mod     PT G Codes:        Brynne Doane,PT Acute Rehabilitation [phone] (pager)   Berline Lopes 07/28/2017, 12:17 PM

## 2017-07-28 NOTE — Progress Notes (Signed)
PROGRESS NOTE    Johnny Patrick  KKD:[id number] DOB: [birth year] DOA: 07/26/2017 PCP: Georgianne Fick, MD     Brief Narrative:  Johnny Patrick is a 72 y.o. male with medical history significant of hypertension, diabetes mellitus, COPD, GERD, hypothyroidism, left BKA, MRSA septicemia, cryptococcus infection in left arm, psoriatic arthritis, rheumatoid arthritis, OSA not on CPAP, CKD stage 2, who presents with shortness of breath, cough, fever, chills, confusion. Per patient's wife, patient started having fever and chills today. He has a productive cough with yellow colored mucus production. He has shortness of breath, but no chest pain. He is very lethargic and became confused today.  No unilateral weakness, numbness or tingling to extremities. Patient's wife states that the patient had a choking episode 3 days ago, and spite out a little bit of blood that time.  In the emergency department, he was febrile 102.3 with hypotension blood pressure 80/53 with O2 sat in the 80s on room air.  Chest x-ray revealed right perihilar and left basilar infiltration.  Patient was admitted for aspiration pneumonia, HCAP, AKI.  Assessment & Plan:   Principal Problem:   Acute and chronic respiratory failure with hypoxia (HCC) Active Problems:   Hypothyroidism   Essential hypertension   Rheumatoid arthritis (HCC)   Psoriatic arthritis (HCC)   Acute metabolic encephalopathy   Type II diabetes mellitus with renal manifestations (HCC)   Aspiration pneumonia (HCC)   Acute renal failure superimposed on stage 2 chronic kidney disease (HCC)   Sepsis (HCC)   COPD with acute exacerbation (HCC)   Acute and chronic respiratory failure with hypoxia due to aspiration pneumonia, HCAP -Continue [place] O2 and wean to room air as able   Sepsis secondary to aspiration pneumonia, HCAP  -CXR: infiltrate in right perihilar and left basilar area, consistent with possible aspiration pneumonia -Procalcitonin 1.74  -Influenza  negative  -Continue IV Zosyn. 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 -Hold glipizide -SSI  AKI on CKD stage 2 -IVF and resolved Cr back to baseline  Chronic pain syndrome -Resume home med, dilaudid, oxy, flexeril, gabapentin    DVT prophylaxis: Lovenox Code Status: Full Family Communication: No family at bedside Disposition Plan: Pending improvement in clinical status, PT eval, wean O2    Consultants:   None  Procedures:   None   Antimicrobials:  Anti-infectives (From admission, onward)   Start     Dose/Rate Route Frequency Ordered Stop   07/27/17 2030  vancomycin (VANCOCIN) IVPB 1000 mg/200 mL premix  Status:  Discontinued     1,000 mg 200 mL/hr over 60 Minutes Intravenous Every 24 hours 07/26/17 2044 07/27/17 1440   07/27/17 1530  piperacillin-tazobactam (ZOSYN) IVPB 3.375 g     3.375 g 12.5 mL/hr over 240 Minutes Intravenous Every 8 hours 07/27/17 1523     07/27/17 1500  vancomycin (VANCOCIN) IVPB 750 mg/150 ml premix  Status:  Discontinued     750 mg 150 mL/hr over 60 Minutes Intravenous Every 12 hours 07/27/17 1440 07/28/17 0717   07/27/17 0400  aztreonam (AZACTAM) 1 g in sodium chloride 0.9 % 100 mL IVPB  Status:  Discontinued     1 g 200  mL/hr over 30 Minutes Intravenous Every 8 hours 07/26/17 2017 07/27/17 1521   07/26/17 2200  aztreonam (AZACTAM) 1 g in sodium chloride 0.9 % 100 mL IVPB  Status:  Discontinued     1 g 200 mL/hr over 30 Minutes Intravenous Every 8 hours 07/26/17 2004 07/26/17 2017   07/26/17 2015  metroNIDAZOLE (FLAGYL) IVPB 500 mg  Status:  Discontinued     500 mg 100 mL/hr over 60 Minutes  Intravenous Every 8 hours 07/26/17 2004 07/27/17 1521   07/26/17 1745  levofloxacin (LEVAQUIN) IVPB 750 mg     750 mg 100 mL/hr over 90 Minutes Intravenous  Once 07/26/17 1737 07/26/17 1946   07/26/17 1745  aztreonam (AZACTAM) 2 g in sodium chloride 0.9 % 100 mL IVPB     2 g 200 mL/hr over 30 Minutes Intravenous  Once 07/26/17 1737 07/26/17 2028   07/26/17 1745  vancomycin (VANCOCIN) IVPB 1000 mg/200 mL premix     1,000 mg 200 mL/hr over 60 Minutes Intravenous  Once 07/26/17 1737 07/26/17 2129       Subjective: Patient feeling better overall.  Breathing seems stable.  He is asking for his home pain medications to be resumed.  No other complaints today.  Continues to be weak overall.  Objective: Vitals:   07/27/17 1939 07/28/17 0156 07/28/17 0840 07/28/17 0857  BP: (!) 162/79 (!) 155/77 (!) 180/84   Pulse: 70 71 67   Resp: 14 16 (!) 26   Temp: 97.9 F (36.6 C) 97.7 F (36.5 C) 98.2 F (36.8 C)   TempSrc: Oral Oral Oral   SpO2: (!) 83% 93% 93% 93%  Weight:      Height:        Intake/Output Summary (Last 24 hours) at 07/28/2017 1106 Last data filed at 07/28/2017 1004 Gross per 24 hour  Intake 4645.83 ml  Output 900 ml  Net 3745.83 ml   Filed Weights   07/26/17 2242 07/27/17 1346  Weight: 64.4 kg (142 lb) 63.4 kg (139 lb 12.4 oz)    Examination: General exam: Appears calm and comfortable  Respiratory system: Crackles basilar. Respiratory effort normal. Cardiovascular system: S1 & S2 heard, RRR. No JVD, murmurs, rubs, gallops or clicks. Gastrointestinal system: Abdomen is nondistended, soft and nontender. No organomegaly or masses felt. Normal bowel sounds heard. Central nervous system: Alert and oriented. No focal neurological deficits. Skin: No rashes, lesions or ulcers Psychiatry: Judgement and insight appear normal. Mood & affect appropriate.    Data Reviewed: I have personally reviewed following labs and imaging studies  CBC: Recent Labs  Lab 07/26/17 1736  07/28/17 0230  WBC 9.0 11.1*  NEUTROABS 7.6  --   HGB 12.4* 9.4*  HCT 39.2 29.1*  MCV 91.2 90.1  PLT 243 194   Basic Metabolic Panel: Recent Labs  Lab 07/26/17 1736 07/27/17 1105 07/28/17 0230  NA 137 140 136  K 4.3 4.1 3.1*  CL 100* 108 106  CO2 24 23 22   GLUCOSE 240* 129* 86  BUN 36* 17 11  CREATININE 1.62* 0.75 0.69  CALCIUM 10.1 8.4* 7.9*   GFR: Estimated Creatinine Clearance: 72.6 mL/min (by C-G formula based on SCr of 0.69 mg/dL). Liver Function Tests: Recent Labs  Lab 07/26/17 1736  AST 22  ALT 16*  ALKPHOS 76  BILITOT 0.7  PROT 6.6  ALBUMIN 3.2*   Recent Labs  Lab 07/26/17 1736  LIPASE 23   Recent Labs  Lab 07/26/17 1737  AMMONIA 22   Coagulation Profile: No results  for input(s): INR, PROTIME in the last 168 hours. Cardiac Enzymes: No results for input(s): CKTOTAL, CKMB, CKMBINDEX, TROPONINI in the last 168 hours. BNP (last 3 results) No results for input(s): PROBNP in the last 8760 hours. HbA1C: No results for input(s): HGBA1C in the last 72 hours. CBG: Recent Labs  Lab 07/27/17 0043 07/27/17 1100 07/27/17 1605 07/27/17 2155 07/28/17 0738  GLUCAP 118* 121* 112* 124* 107*   Lipid Profile: No results for input(s): CHOL, HDL, LDLCALC, TRIG, CHOLHDL, LDLDIRECT in the last 72 hours. Thyroid Function Tests: No results for input(s): TSH, T4TOTAL, FREET4, T3FREE, THYROIDAB in the last 72 hours. Anemia Panel: No results for input(s): VITAMINB12, FOLATE, FERRITIN, TIBC, IRON, RETICCTPCT in the last 72 hours. Sepsis Labs: Recent Labs  Lab 07/26/17 1804 07/26/17 1957 07/26/17 2053 07/27/17 0100  PROCALCITON  --   --  1.74  --   LATICACIDVEN 2.91* 2.17* 1.7 1.2    Recent Results (from the past 240 hour(s))  Blood Culture (routine x 2)     Status: None (Preliminary result)   Collection Time: 07/26/17  5:37 PM  Result Value Ref Range Status   Specimen Description BLOOD LEFT FOREARM  Final   Special Requests   Final    BOTTLES DRAWN  AEROBIC AND ANAEROBIC Blood Culture adequate volume   Culture   Final    NO GROWTH < 24 HOURS Performed at [facility], [address], [place], [place] [address]    Report Status PENDING  Incomplete  Blood Culture (routine x 2)     Status: None (Preliminary result)   Collection Time: 07/26/17  5:45 PM  Result Value Ref Range Status   Specimen Description BLOOD RIGHT FOREARM  Final   Special Requests   Final    BOTTLES DRAWN AEROBIC AND ANAEROBIC Blood Culture adequate volume   Culture   Final    NO GROWTH < 24 HOURS Performed at [facility], [address], [place], [place] [address]    Report Status PENDING  Incomplete  Urine culture     Status: None   Collection Time: 07/26/17  6:28 PM  Result Value Ref Range Status   Specimen Description URINE, CATHETERIZED  Final   Special Requests NONE  Final   Culture   Final    NO GROWTH Performed at [facility], [address] [place], [place] [address]    Report Status 07/27/2017 FINAL  Final  MRSA PCR Screening     Status: None   Collection Time: 07/27/17  3:44 PM  Result Value Ref Range Status   MRSA by PCR NEGATIVE NEGATIVE Final    Comment:        The GeneXpert MRSA Assay (FDA approved for NASAL specimens only), is one component of a comprehensive MRSA colonization surveillance program. It is not intended to diagnose MRSA infection nor to guide or monitor treatment for MRSA infections. Performed at [facility], [address] [place], [place] [address]        Radiology Studies: Dg Chest Port 1 View  Result Date: 07/26/2017 CLINICAL DATA:  Sepsis, hypoxia EXAM: PORTABLE CHEST 1 VIEW COMPARISON:  12/22/2016 FINDINGS: Cardiomegaly. Right perihilar and left basilar airspace opacities concerning for pneumonia. No visible effusions. No acute bony abnormality. IMPRESSION: Right perihilar and left basilar opacities concerning for pneumonia. Electronically Signed   By: Charlett Nose M.D.   On:  07/26/2017 18:50      Scheduled Meds: . calcium carbonate  625  mg Oral TID WC  . carvedilol  25 mg Oral BID  . enoxaparin (LOVENOX) injection  40 mg Subcutaneous Q24H  . fluticasone furoate-vilanterol  1 puff Inhalation Daily  . folic acid  1 mg Oral Daily  . gabapentin  300 mg Oral TID  . insulin aspart  0-5 Units Subcutaneous QHS  . insulin aspart  0-9 Units Subcutaneous TID WC  . ipratropium  0.5 mg Nebulization Q6H  . leflunomide  30 mg Oral Daily  . levothyroxine  150 mcg Oral QAC breakfast  . losartan  50 mg Oral Daily  . mouth rinse  15 mL Mouth Rinse BID  . multivitamin with minerals  1 tablet Oral Daily  . omega-3 acid ethyl esters  1 g Oral Daily  . potassium chloride  40 mEq Oral Q4H  . predniSONE  5 mg Oral Daily  . vitamin C  500 mg Oral Daily   Continuous Infusions: . piperacillin-tazobactam (ZOSYN)  IV Stopped (07/28/17 1004)     LOS: 2 days    Time spent: 25 minutes   Noralee Stain, DO [facility] [email or web address] Password [facility] 07/28/2017, 11:06 AM

## 2017-07-28 NOTE — Evaluation (Signed)
Occupational Therapy Evaluation Patient Details Name: Johnny Patrick MRN: [id number] DOB: Apr 11, 1945 Today's Date: 07/28/2017    History of Present Illness Johnny Patrick is a 72 y.o. male with medical history significant of hypertension, diabetes mellitus, COPD, GERD, hypothyroidism, left BKA, MRSA septicemia, cryptococcus infection in left arm, psoriatic arthritis, rheumatoid arthritis, OSA not on CPAP, CKD stage 2, who presents with shortness of breath, cough, fever, chills, confusion. Per patient's wife, patient started having fever and chills today. He has a productive cough with yellow colored mucus production. Patient was admitted for aspiration pneumonia, HCAP, AKI.   Clinical Impression   Limited assessment today due to pt not having his prosthesis available. Pt is typically independent in self care with DME. Pt presents with generalized weakness. He required min assist to squat pivot back to bed from chair this visit. Pt likely to progress well to return home with his wife. Will follow acutely.    Follow Up Recommendations  No OT follow up    Equipment Recommendations  None recommended by OT    Recommendations for Other Services       Precautions / Restrictions Precautions Precautions: Fall Required Braces or Orthoses: Other Brace/Splint(L LE prosthesis) Restrictions Weight Bearing Restrictions: No      Mobility Bed Mobility Overal bed mobility: Needs Assistance Bed Mobility: Sit to Supine      Sit to supine: Supervision   General bed mobility comments: pt returned to bed  Transfers Overall transfer level: Needs assistance Equipment used: 1 person hand held assist Transfers: Squat Pivot Transfers     Squat pivot transfers: Min assist     General transfer comment: steadying assist to pivot chair to bed    Balance Overall balance assessment: Needs assistance Sitting-balance support: Feet supported;Bilateral upper extremity supported Sitting balance-Leahy  Scale: Fair Sitting balance - Comments: no assist needed at edge of chair Postural control: Posterior lean                              ADL either performed or assessed with clinical judgement   ADL Overall ADL's : Needs assistance/impaired Eating/Feeding: Independent;Sitting   Grooming: Set up;Sitting   Upper Body Bathing: Set up;Sitting   Lower Body Bathing: Minimal assistance;Sit to/from stand   Upper Body Dressing : Set up;Sitting   Lower Body Dressing: Minimal assistance;Sit to/from stand   Toilet Transfer: Minimal assistance;Squat-pivot;BSC   Toileting- Clothing Manipulation and Hygiene: Minimal assistance;Sit to/from stand         General ADL Comments: Prosthesis is at home, wife to bring.     Vision Patient Visual Report: No change from baseline       Perception     Praxis      Pertinent Vitals/Pain Pain Assessment: 0-10 Faces Pain Scale: Hurts whole lot Pain Location: back Pain Descriptors / Indicators: Aching;Discomfort Pain Intervention(s): Repositioned;Monitored during session     Hand Dominance Right   Extremity/Trunk Assessment Upper Extremity Assessment Upper Extremity Assessment: Overall WFL for tasks assessed   Lower Extremity Assessment Lower Extremity Assessment: Defer to PT evaluation   Cervical / Trunk Assessment Cervical / Trunk Assessment: Kyphotic   Communication Communication Communication: No difficulties   Cognition Arousal/Alertness: Awake/alert Behavior During Therapy: WFL for tasks assessed/performed Overall Cognitive Status: Within Functional Limits for tasks assessed  General Comments       Exercises     Shoulder Instructions      Home Living Family/patient expects to be discharged to:: Private residence Living Arrangements: Spouse/significant other Available Help at Discharge: Family;Available 24 hours/day Type of Home: House Home Access:  Stairs to enter [employer] of Steps: 2 Entrance Stairs-Rails: None Home Layout: One level;Laundry or work area in basement     [employer] Shower/Tub: Chief Strategy Officer: Standard     Home Equipment: Environmental consultant - 2 wheels;Cane - single point;Bedside commode;Shower seat;Adaptive equipment;Walker - 4 wheels;Tub bench          Prior Functioning/Environment Level of Independence: Independent with assistive device(s)        Comments: uses rollator vs RW for ambulation per pt        OT Problem List: Impaired balance (sitting and/or standing);Pain;Decreased strength;Decreased activity tolerance      OT Treatment/Interventions: Self-care/ADL training;DME and/or AE instruction;Therapeutic activities;Patient/family education;Balance training    OT Goals(Current goals can be found in the care plan section) Acute Rehab OT Goals Patient Stated Goal: to  go home OT Goal Formulation: With patient Time For Goal Achievement: 08/11/17 Potential to Achieve Goals: Good ADL Goals Pt Will Perform Grooming: with supervision;standing Pt Will Perform Lower Body Bathing: with supervision;sit to/from stand Pt Will Perform Lower Body Dressing: with supervision;sit to/from stand Pt Will Transfer to Toilet: with supervision;ambulating;regular height toilet Pt Will Perform Toileting - Clothing Manipulation and hygiene: with supervision;sit to/from stand  OT Frequency: Min 2X/week   Barriers to D/C:            Co-evaluation              AM-PAC PT "6 Clicks" Daily Activity     Outcome Measure Help from another person eating meals?: None Help from another person taking care of personal grooming?: A Little Help from another person toileting, which includes using toliet, bedpan, or urinal?: A Little Help from another person bathing (including washing, rinsing, drying)?: A Little Help from another person to put on and taking off regular upper body clothing?: None Help  from another person to put on and taking off regular lower body clothing?: A Little 6 Click Score: 20   End of Session Equipment Utilized During Treatment: Gait belt;Oxygen Nurse Communication: Mobility status  Activity Tolerance: Patient tolerated treatment well Patient left: in bed;with call bell/phone within reach;with bed alarm set  OT Visit Diagnosis: Unsteadiness on feet (R26.81);Pain;Muscle weakness (generalized) (M62.81)                Time: 6789-3810 OT Time Calculation (min): 22 min Charges:  OT General Charges $OT Visit: 1 Visit OT Evaluation $OT Eval Moderate Complexity: 1 Mod G-Codes:     08-05-2017 Johnny Patrick, OTR/L Pager: [phone]  Johnny Patrick, Johnny Patrick 05-Aug-2017, 1:29 PM

## 2017-07-29 LAB — CBC
HEMATOCRIT: 34.8 % — AB (ref 39.0–52.0)
HEMOGLOBIN: 11.5 g/dL — AB (ref 13.0–17.0)
MCH: 29.3 pg (ref 26.0–34.0)
MCHC: 33 g/dL (ref 30.0–36.0)
MCV: 88.8 fL (ref 78.0–100.0)
Platelets: 228 10*3/uL (ref 150–400)
RBC: 3.92 MIL/uL — ABNORMAL LOW (ref 4.22–5.81)
RDW: 15 % (ref 11.5–15.5)
WBC: 12.4 10*3/uL — AB (ref 4.0–10.5)

## 2017-07-29 LAB — BASIC METABOLIC PANEL
ANION GAP: 12 (ref 5–15)
BUN: <5 mg/dL — ABNORMAL LOW (ref 6–20)
CHLORIDE: 99 mmol/L — AB (ref 101–111)
CO2: 25 mmol/L (ref 22–32)
Calcium: 8.8 mg/dL — ABNORMAL LOW (ref 8.9–10.3)
Creatinine, Ser: 0.64 mg/dL (ref 0.61–1.24)
GFR calc Af Amer: >60 mL/min (ref >60)
GFR calc non Af Amer: >60 mL/min (ref >60)
GLUCOSE: 128 mg/dL — AB (ref 65–99)
Potassium: 3.5 mmol/L (ref 3.5–5.1)
Sodium: 136 mmol/L (ref 135–145)

## 2017-07-29 LAB — GLUCOSE, CAPILLARY
GLUCOSE-CAPILLARY: 211 mg/dL — AB (ref 65–99)
Glucose-Capillary: 117 mg/dL — ABNORMAL HIGH (ref 65–99)

## 2017-07-29 MED ORDER — CLINDAMYCIN HCL 300 MG PO CAPS: 300.0000 mg | ORAL_CAPSULE | Freq: Three times a day (TID) | ORAL | 0 refills | Status: AC

## 2017-07-29 NOTE — Discharge Instructions (Signed)
Aspiration Pneumonia °Aspiration pneumonia is an infection in your lungs. It occurs when food, liquid, or stomach contents (vomit) are inhaled (aspirated) into your lungs. When these things get into your lungs, swelling (inflammation) and infection can occur. This can make it difficult for you to breathe. Aspiration pneumonia is a serious condition and can be life threatening. °What increases the risk? °Aspiration pneumonia is more likely to occur when a person's cough (gag) reflex or ability to swallow has been decreased. Some things that can do this include: °· Having a brain injury or disease, such as stroke, seizures, Parkinson's disease, dementia, or amyotrophic lateral sclerosis (ALS). °· Being given general anesthetic for procedures. °· Being in a coma (unconscious). °· Having a narrowing of the tube that carries food to the stomach (esophagus). °· Drinking too much alcohol. If a person passes out and vomits, vomit can be swallowed into the lungs. °· Taking certain medicines, such as tranquilizers or sedatives. ° °What are the signs or symptoms? °· Coughing after swallowing food or liquids. °· Breathing problems, such as wheezing or shortness of breath. °· Bluish skin. This can be caused by lack of oxygen. °· Coughing up food or mucus. The mucus might contain blood, greenish material, or yellowish-white fluid (pus). °· Fever. °· Chest pain. °· Being more tired than usual (fatigue). °· Sweating more than usual. °· Bad breath. °How is this diagnosed? °A physical exam will be done. During the exam, the health care provider will listen to your lungs with a stethoscope to check for: °· Crackling sounds in the lungs. °· Decreased breath sounds. °· A rapid heartbeat. ° °Various tests may be ordered. These may include: °· Chest X-ray. °· CT scan. °· Swallowing study. This test looks at how food is swallowed and whether it goes into your breathing tube (trachea) or food pipe (esophagus). °· Sputum culture. Saliva and  mucus (sputum) are collected from the lungs or the tubes that carry air to the lungs (bronchi). The sputum is then tested for bacteria. °· Bronchoscopy. This test uses a flexible tube (bronchoscope) to see inside the lungs. ° °How is this treated? °Treatment will usually include antibiotic medicines. Other medicines may also be used to reduce fever or pain. You may need to be treated in the hospital. In the hospital, your breathing will be carefully monitored. Depending on how well you are breathing, you may need to be given oxygen, or you may need breathing support from a breathing machine (ventilator). For people who fail a swallowing study, a feeding tube might be placed in the stomach, or they may be asked to avoid certain food textures or liquids when they eat. °Follow these instructions at home: °· Carefully follow any special eating instructions you were given, such as avoiding certain food textures or thickening liquids. This reduces the risk of developing aspiration pneumonia again. °· Only take over-the-counter or prescription medicines as directed by your health care provider. Follow the directions carefully. °· If you were prescribed antibiotics, take them as directed. Finish them even if you start to feel better. °· Rest as instructed by your health care provider. °· Keep all follow-up appointments with your health care provider. °Contact a health care provider if: °· You develop worsening shortness of breath, wheezing, or difficulty breathing. °· You develop a fever. °· You have chest pain. °This information is not intended to replace advice given to you by your health care provider. Make sure you discuss any questions you have with your health care   provider. °Document Released: 01/20/2009 Document Revised: 09/06/2015 Document Reviewed: 09/10/2012 °Elsevier Interactive Patient Education © 2017 Elsevier Inc. ° °

## 2017-07-29 NOTE — Progress Notes (Signed)
Patient given all d/c paper work. All pages reviewed and questions answered. IV and tele d/ced Social worker

## 2017-07-29 NOTE — Consult Note (Signed)
            [facility] CM Primary Care Navigator  07/29/2017  Johnny Patrick 03/20/1946 [id number]  Wenttoseepatient at the bedsideto identify possible discharge needs but he was alreadydischargedper staff report.  Patient was discharged home today.  PerMD note, patientwas seen for weakness, poor oral intake and confusion (acute respiratory failure with hypoxia, lobar pneumonia)  Primary care provider's officeis listed asprovidingtransition of care (TOC) follow-up.  Patient hasdischarge instruction to follow-up withprimary care provider (Dr. Ajith Ramachandran) in 1 week post discharge.   For additional questions please contact:  Erla Bacchi A. Maddilyn Campus, BSN, RN-BC [facility] PRIMARY CARE Navigator Cell: [phone] 

## 2017-07-29 NOTE — Consult Note (Signed)
            [facility] CM Primary Care Navigator  07/29/2017  ELIUS ETHEREDGE 19-Nov-1945 [id number]  Wenttoseepatient at the bedsideto identify possible discharge needs but he was alreadydischargedper staff report.  Patient was discharged home today.  PerMD note, patientwas seen for weakness, poor oral intake and confusion (acute respiratory failure with hypoxia, lobar pneumonia)  Primary care provider's officeis listed asprovidingtransition of care (TOC) follow-up.  Patient hasdischarge instruction to follow-up withprimary care provider (Dr. Georgianne Fick) in 1 week post discharge.   For additional questions please contact:  Karin Golden A. Deazia Lampi, BSN, RN-BC [facility] PRIMARY CARE Navigator Cell: [phone]

## 2017-07-29 NOTE — Care Management Note (Signed)
Case Management Note  Patient Details  Name: Johnny Patrick MRN: [id number] Date of Birth: Dec 13, 1945  Subjective/Objective:   From home with spouse,  resp failure, asp pna, aki, on iv abx, cont to wean oxygen to room air. PT  rec HHPT, NCM offered HH agency choice to patient , he refused HHPT states he does not need it.                Action/Plan: DC home, refused HHPT.   Expected Discharge Date:  07/29/17               Expected Discharge Plan:  Home w Home Health Services  In-House Referral:     Discharge planning Services  CM Consult  Post Acute Care Choice:    Choice offered to:  Patient  DME Arranged:    DME Agency:     HH Arranged:  Patient Refused HH HH Agency:     Status of Service:  Completed, signed off  If discussed at [employer] of Stay Meetings, dates discussed:    Additional Comments:  Leone Haven, RN 07/29/2017, 12:04 PM

## 2017-07-29 NOTE — Plan of Care (Signed)
Discussed plan of care with patient.  Explained why several pain medications could not be administered at the same time.

## 2017-07-29 NOTE — Discharge Summary (Signed)
Physician Discharge Summary  [name] FAO:[id number] DOB: 22-Sep-1945 DOA: 07/26/2017  PCP: Merrilee Seashore, MD  Admit date: 07/26/2017 Discharge date: 07/29/2017  Admitted From: Home Disposition:  Home  Recommendations for Outpatient Follow-up:  1. Follow up with PCP in 1 week 2. Repeat CBC and CXR at follow up visit  3. Please follow up on the following pending results: blood culture result, negative at day of discharge  4. Patient's wife refusing Augmentin, apparently patient had an adverse reaction to Augmentin with elevated liver enzymes in the past. Patient has tolerated IV zosyn in hospital with no allergic reaction. Will discharge home on Clindamycin for 5 additional days.   Home Health: PT   Equipment/Devices: None    Discharge Condition: Stable CODE STATUS: Full   Diet recommendation: Dysphagia 2 (fine chop);Thin liquid Liquids provided via: Cup Medication Administration: Whole meds with puree Supervision: Patient able to self feed Compensations: Minimize environmental distractions;Slow rate;Small sips/bites;Hard cough after swallow;Multiple dry swallows after each bite/sip Postural Changes and/or Swallow Maneuvers: Seated upright 90 degrees  Brief/Interim Summary: Johnny Patrick a 72 y.o.malewith medical history significant ofhypertension, diabetes mellitus, COPD, GERD, hypothyroidism, left BKA, MRSA septicemia, cryptococcus infection in left arm, psoriatic arthritis, rheumatoid arthritis, OSA not on CPAP, CKD stage 2, who presents with shortness of breath, cough, fever, chills, confusion. Per patient's wife, patient started having fever and chills today. He has a productive cough with yellow colored mucus production. He has shortness of breath, but no chest pain. He is very lethargic and became confused today. No unilateral weakness, numbness or tingling to extremities. Patient's wife states that the patient had a choking episode 3 days ago, and spite out a little  bit of blood that time.  In the emergency department, he was febrile 102.3 with hypotension blood pressure 80/53 with O2 sat in the 80s on room air.  Chest x-ray revealed right perihilar and left basilar infiltration.  Patient was admitted for aspiration pneumonia, HCAP, AKI. He was treated with empiric IV vancomycin, Zosyn.  This was de-escalated to IV Zosyn.   Discharge Diagnoses:  Principal Problem:   Acute and chronic respiratory failure with hypoxia (HCC) Active Problems:   Hypothyroidism   Essential hypertension   Rheumatoid arthritis (HCC)   Psoriatic arthritis (HCC)   Acute metabolic encephalopathy   Type II diabetes mellitus with renal manifestations (HCC)   Aspiration pneumonia (HCC)   Acute renal failure superimposed on stage 2 chronic kidney disease (HCC)   Sepsis ([place])   COPD with acute exacerbation (HCC)   Acute and chronic respiratory failure with hypoxiadue to aspiration pneumonia, HCAP -Continue Short Pump O2 and wean to room air as able   Sepsis secondary to aspiration pneumonia, HCAP  -CXR: infiltrate in right perihilar and left basilar area, consistent with possible aspiration pneumonia -Procalcitonin 1.74  -Influenza negative  -Vanco discontinued as MRSA PCR negative.  Treated with IV Zosyn which was the escalated to clindamycin on discharge home -SLP eval for aspiration risk, dysphagia 2 diet   Hypothyroidism -Continue home Synthroid  Essential hypertension -Continue coreg and cozaar   Rheumatoid arthritisandPsoriatic arthritis -Continue Leflunomid and prednisone -Solu cortef 50 mg x 1 as stress dose in ED due to hypotension   DM type 2with renal complications of CKD stage II -Hold glipizide -resume at discharge -SSI  AKI on CKD stage 2 -IVF and resolved Cr back to baseline  Chronic pain syndrome -Resume home med, dilaudid, oxy, flexeril, gabapentin      Discharge Instructions  Discharge Instructions  Call MD for:  difficulty  breathing, headache or visual disturbances   Complete by:  As directed    Call MD for:  extreme fatigue   Complete by:  As directed    Call MD for:  hives   Complete by:  As directed    Call MD for:  persistant dizziness or light-headedness   Complete by:  As directed    Call MD for:  persistant nausea and vomiting   Complete by:  As directed    Call MD for:  severe uncontrolled pain   Complete by:  As directed    Call MD for:  temperature >100.4   Complete by:  As directed    Discharge instructions   Complete by:  As directed    You were cared for by a hospitalist during your hospital stay. If you have any questions about your discharge medications or the care you received while you were in the hospital after you are discharged, you can call the unit and asked to speak with the hospitalist on call if the hospitalist that took care of you is not available. Once you are discharged, your primary care physician will handle any further medical issues. Please note that NO REFILLS for any discharge medications will be authorized once you are discharged, as it is imperative that you return to your primary care physician (or establish a relationship with a primary care physician if you do not have one) for your aftercare needs so that they can reassess your need for medications and monitor your lab values.   Discharge instructions   Complete by:  As directed    Diet recommendation: Dysphagia 2 (fine chop);Thin liquid Liquids provided via: Cup Medication Administration: Whole meds with puree Supervision: Patient able to self feed Compensations: Minimize environmental distractions;Slow rate;Small sips/bites;Hard cough after swallow;Multiple dry swallows after each bite/sip Postural Changes and/or Swallow Maneuvers: Seated upright 90 degrees   Increase activity slowly   Complete by:  As directed      Allergies as of 07/29/2017      Reactions   Sulfur Rash      Medication List    TAKE these  medications   blood glucose meter kit and supplies Dispense based on patient and insurance preference. Use up to four times daily as directed. (FOR ICD-9 250.00, 250.01).   CALCIUM 600 600 MG Tabs tablet Generic drug:  calcium carbonate Take 600 mg by mouth 3 (three) times daily.   carvedilol 25 MG tablet Commonly known as:  COREG TAKE ONE TABLET TWICE DAILY   clindamycin 300 MG capsule Commonly known as:  CLEOCIN Take 1 capsule (300 mg total) by mouth 3 (three) times daily for 5 days.   cyclobenzaprine 10 MG tablet Commonly known as:  FLEXERIL Take 10 mg by mouth every 8 (eight) hours as needed. For muscle pain.   Fish Oil 1000 MG Caps Take 1 capsule by mouth 3 (three) times daily.   fluticasone furoate-vilanterol 100-25 MCG/INH Aepb Commonly known as:  BREO ELLIPTA Inhale 1 puff into the lungs daily. Inhale 1 puff then rinse mouth, once daily   folic acid 1 MG tablet Commonly known as:  FOLVITE Take 1 mg by mouth daily.   gabapentin 300 MG capsule Commonly known as:  NEURONTIN Take 1 capsule by mouth 3 (three) times daily.   glipiZIDE 5 MG tablet Commonly known as:  GLUCOTROL Take 0.5 tablets (2.5 mg total) by mouth daily before breakfast. What changed:  how much to take  GLUCERNA Liqd Take 237 mLs by mouth 2 (two) times daily between meals.   HYDROmorphone 2 MG tablet Commonly known as:  DILAUDID Take 2 mg by mouth every 4 (four) hours as needed for severe pain.   leflunomide 10 MG tablet Commonly known as:  ARAVA Take 30 mg by mouth daily.   levothyroxine 150 MCG tablet Commonly known as:  SYNTHROID, LEVOTHROID Take 150 mcg by mouth daily.   losartan 50 MG tablet Commonly known as:  COZAAR Take 50 mg by mouth.   multivitamin with minerals Tabs tablet Take 1 tablet by mouth daily.   oxyCODONE-acetaminophen 10-325 MG tablet Commonly known as:  PERCOCET Take 1 to 2 tablets every 6 hours as needed for pain.   predniSONE 5 MG tablet Commonly known  as:  DELTASONE Take 5 mg by mouth daily.   SYMBICORT 160-4.5 MCG/ACT inhaler Generic drug:  budesonide-formoterol USE TWO PUFFS TWICE DAILY   temazepam 15 MG capsule Commonly known as:  RESTORIL TAKE 1 OR 2 CAPSULES AT BEDTIME AS NEEDED FOR SLEEP.   triamcinolone cream 0.1 % Commonly known as:  KENALOG Apply 1 application topically 2 (two) times daily as needed (psorisis).   Vitamin C 500 MG Chew Chew 500 mg by mouth daily.      Follow-up Information    Merrilee Seashore, MD. Schedule an appointment as soon as possible for a visit in 1 week(s).   Specialty:  Internal Medicine Contact information: [place] [address] [phone]          Allergies  Allergen Reactions  . Sulfur Rash    Consultations:  None   Procedures/Studies: Dg Chest Port 1 View  Result Date: 07/26/2017 CLINICAL DATA:  Sepsis, hypoxia EXAM: PORTABLE CHEST 1 VIEW COMPARISON:  12/22/2016 FINDINGS: Cardiomegaly. Right perihilar and left basilar airspace opacities concerning for pneumonia. No visible effusions. No acute bony abnormality. IMPRESSION: Right perihilar and left basilar opacities concerning for pneumonia. Electronically Signed   By: Rolm Baptise M.D.   On: 07/26/2017 18:50       Discharge Exam: Vitals:   07/29/17 0814 07/29/17 0848  BP: (!) 156/87   Pulse: 90 80  Resp: (!) 22 20  Temp: 98 F (36.7 C)   SpO2: 94% 94%    General: Pt is alert, awake, not in acute distress Cardiovascular: RRR, S1/S2 +, no rubs, no gallops Respiratory: CTA bilaterally, minimal crackles in lower bases, no wheezing, no rhonchi Abdominal: Soft, NT, ND, bowel sounds + Extremities: no edema, no cyanosis, Left BKA     The results of significant diagnostics from this hospitalization (including imaging, microbiology, ancillary and laboratory) are listed below for reference.     Microbiology: Recent Results (from the past 240 hour(s))  Blood Culture (routine x 2)      Status: None (Preliminary result)   Collection Time: 07/26/17  5:37 PM  Result Value Ref Range Status   Specimen Description BLOOD LEFT FOREARM  Final   Special Requests   Final    BOTTLES DRAWN AEROBIC AND ANAEROBIC Blood Culture adequate volume   Culture   Final    NO GROWTH 3 DAYS Performed at [facility], [address], [place], [place] [address]    Report Status PENDING  Incomplete  Blood Culture (routine x 2)     Status: None (Preliminary result)   Collection Time: 07/26/17  5:45 PM  Result Value Ref Range Status   Specimen Description BLOOD RIGHT FOREARM  Final  Special Requests   Final    BOTTLES DRAWN AEROBIC AND ANAEROBIC Blood Culture adequate volume   Culture   Final    NO GROWTH 3 DAYS Performed at [facility], [place] [address], [place], [place] [address]    Report Status PENDING  Incomplete  Urine culture     Status: None   Collection Time: 07/26/17  6:28 PM  Result Value Ref Range Status   Specimen Description URINE, CATHETERIZED  Final   Special Requests NONE  Final   Culture   Final    NO GROWTH Performed at [facility], [place] [address] [place], [place] [address]    Report Status 07/27/2017 FINAL  Final  MRSA PCR Screening     Status: None   Collection Time: 07/27/17  3:44 PM  Result Value Ref Range Status   MRSA by PCR NEGATIVE NEGATIVE Final    Comment:        The GeneXpert MRSA Assay (FDA approved for NASAL specimens only), is one component of a comprehensive MRSA colonization surveillance program. It is not intended to diagnose MRSA infection nor to guide or monitor treatment for MRSA infections. Performed at [facility], [place] [address] [place], [place] [address]      Labs: BNP (last 3 results) Recent Labs    12/19/16 0504  BNP 75.1   Basic Metabolic Panel: Recent Labs  Lab 07/26/17 1736 07/27/17 1105 07/28/17 0230 07/29/17 0250  NA 137 140 136 136  K 4.3 4.1 3.1* 3.5  CL 100* 108 106 99*  CO2 _0 GLUCOSE 240* 129* 86 128*  BUN 36* 17 11 <5*  CREATININE 1.62* 0.75 0.69 0.64  CALCIUM 10.1 8.4* 7.9* 8.8*   Liver Function Tests: Recent Labs  Lab 07/26/17 1736  AST 22  ALT 16*  ALKPHOS 76  BILITOT 0.7  PROT 6.6  ALBUMIN 3.2*   Recent Labs  Lab 07/26/17 1736  LIPASE 23   Recent Labs  Lab 07/26/17 1737  AMMONIA 22   CBC: Recent Labs  Lab 07/26/17 1736 07/28/17 0230 07/29/17 0250  WBC 9.0 11.1* 12.4*  NEUTROABS 7.6  --   --   HGB 12.4* 9.4* 11.5*  HCT 39.2 29.1* 34.8*  MCV 91.2 90.1 88.8  PLT 243 194 228   Cardiac Enzymes: No results for input(s): CKTOTAL, CKMB, CKMBINDEX, TROPONINI in the last 168 hours. BNP: Invalid input(s): POCBNP CBG: Recent Labs  Lab 07/28/17 1155 07/28/17 1348 07/28/17 1649 07/28/17 2232 07/29/17 0733  GLUCAP 137* 173* 197* 110* 117*   D-Dimer No results for input(s): DDIMER in the last 72 hours. Hgb A1c No results for input(s): HGBA1C in the last 72 hours. Lipid Profile No results for input(s): CHOL, HDL, LDLCALC, TRIG, CHOLHDL, LDLDIRECT in the last 72 hours. Thyroid function studies No results for input(s): TSH, T4TOTAL, T3FREE, THYROIDAB in the last 72 hours.  Invalid input(s): FREET3 Anemia work up No results for input(s): VITAMINB12, FOLATE, FERRITIN, TIBC, IRON, RETICCTPCT in the last 72 hours. Urinalysis    Component Value Date/Time   COLORURINE AMBER (A) 07/26/2017 1741   APPEARANCEUR HAZY (A) 07/26/2017 1741   LABSPEC 1.023 07/26/2017 1741   PHURINE 5.0 07/26/2017 1741   GLUCOSEU >=500 (A) 07/26/2017 1741   HGBUR NEGATIVE 07/26/2017 1741   BILIRUBINUR NEGATIVE 07/26/2017 1741   KETONESUR NEGATIVE 07/26/2017 1741   PROTEINUR 30 (A) 07/26/2017 1741   UROBILINOGEN 0.2 12/24/2010 1016   NITRITE NEGATIVE 07/26/2017 1741  LEUKOCYTESUR NEGATIVE 07/26/2017 1741   Sepsis Labs Invalid input(s): PROCALCITONIN,  WBC,  LACTICIDVEN Microbiology Recent Results (from the past 240 hour(s))  Blood Culture  (routine x 2)     Status: None (Preliminary result)   Collection Time: 07/26/17  5:37 PM  Result Value Ref Range Status   Specimen Description BLOOD LEFT FOREARM  Final   Special Requests   Final    BOTTLES DRAWN AEROBIC AND ANAEROBIC Blood Culture adequate volume   Culture   Final    NO GROWTH 3 DAYS Performed at [facility], [address] [place], [place] [address]    Report Status PENDING  Incomplete  Blood Culture (routine x 2)     Status: None (Preliminary result)   Collection Time: 07/26/17  5:45 PM  Result Value Ref Range Status   Specimen Description BLOOD RIGHT FOREARM  Final   Special Requests   Final    BOTTLES DRAWN AEROBIC AND ANAEROBIC Blood Culture adequate volume   Culture   Final    NO GROWTH 3 DAYS Performed at [facility], [place] [address], [place], [place] [address]    Report Status PENDING  Incomplete  Urine culture     Status: None   Collection Time: 07/26/17  6:28 PM  Result Value Ref Range Status   Specimen Description URINE, CATHETERIZED  Final   Special Requests NONE  Final   Culture   Final    NO GROWTH Performed at [facility], [place] [address] [place], [place] [address]    Report Status 07/27/2017 FINAL  Final  MRSA PCR Screening     Status: None   Collection Time: 07/27/17  3:44 PM  Result Value Ref Range Status   MRSA by PCR NEGATIVE NEGATIVE Final    Comment:        The GeneXpert MRSA Assay (FDA approved for NASAL specimens only), is one component of a comprehensive MRSA colonization surveillance program. It is not intended to diagnose MRSA infection nor to guide or monitor treatment for MRSA infections. Performed at [facility], [place] [address] [place],  [address]      Patient was seen and examined on the day of discharge and was found to be in stable condition. Time coordinating discharge: 35 minutes including assessment and coordination of care, as well as examination of the patient.    SIGNED:  Dessa Phi, DO [facility] Pager [phone]  If 7PM-7AM, please contact night-coverage [email or web address] Password [facility] 07/29/2017, 11:25 AM

## 2017-07-31 LAB — CULTURE, BLOOD (ROUTINE X 2)
Culture: NO GROWTH
Culture: NO GROWTH
SPECIAL REQUESTS: ADEQUATE
SPECIAL REQUESTS: ADEQUATE

## 2017-08-13 DIAGNOSIS — J9690 Respiratory failure, unspecified, unspecified whether with hypoxia or hypercapnia: Secondary | ICD-10-CM | POA: Diagnosis not present

## 2017-08-13 DIAGNOSIS — Z79899 Other long term (current) drug therapy: Secondary | ICD-10-CM | POA: Diagnosis not present

## 2017-08-13 DIAGNOSIS — E782 Mixed hyperlipidemia: Secondary | ICD-10-CM | POA: Diagnosis not present

## 2017-08-13 DIAGNOSIS — E1165 Type 2 diabetes mellitus with hyperglycemia: Secondary | ICD-10-CM | POA: Diagnosis not present

## 2017-08-13 DIAGNOSIS — Z09 Encounter for follow-up examination after completed treatment for conditions other than malignant neoplasm: Secondary | ICD-10-CM | POA: Diagnosis not present

## 2017-08-13 DIAGNOSIS — J690 Pneumonitis due to inhalation of food and vomit: Secondary | ICD-10-CM | POA: Diagnosis not present

## 2017-09-12 ENCOUNTER — Other Ambulatory Visit: Payer: Self-pay | Admitting: Internal Medicine

## 2017-09-15 DIAGNOSIS — M1990 Unspecified osteoarthritis, unspecified site: Secondary | ICD-10-CM | POA: Diagnosis not present

## 2017-09-15 DIAGNOSIS — Z79899 Other long term (current) drug therapy: Secondary | ICD-10-CM | POA: Diagnosis not present

## 2017-09-15 DIAGNOSIS — M25561 Pain in right knee: Secondary | ICD-10-CM | POA: Diagnosis not present

## 2017-09-15 DIAGNOSIS — L4050 Arthropathic psoriasis, unspecified: Secondary | ICD-10-CM | POA: Diagnosis not present

## 2017-09-15 DIAGNOSIS — M25579 Pain in unspecified ankle and joints of unspecified foot: Secondary | ICD-10-CM | POA: Diagnosis not present

## 2017-09-15 DIAGNOSIS — M1711 Unilateral primary osteoarthritis, right knee: Secondary | ICD-10-CM | POA: Diagnosis not present

## 2017-09-23 DIAGNOSIS — M4716 Other spondylosis with myelopathy, lumbar region: Secondary | ICD-10-CM | POA: Diagnosis not present

## 2017-09-23 DIAGNOSIS — Z6822 Body mass index (BMI) 22.0-22.9, adult: Secondary | ICD-10-CM | POA: Diagnosis not present

## 2017-09-24 ENCOUNTER — Other Ambulatory Visit (HOSPITAL_COMMUNITY): Payer: Self-pay | Admitting: Neurosurgery

## 2017-09-24 DIAGNOSIS — M4716 Other spondylosis with myelopathy, lumbar region: Secondary | ICD-10-CM

## 2017-10-13 ENCOUNTER — Ambulatory Visit (HOSPITAL_COMMUNITY)
Admission: RE | Admit: 2017-10-13 | Discharge: 2017-10-13 | Disposition: A | Discharge status: Home / Self Care | Payer: Medicare Other | Source: Ambulatory Visit | Attending: Neurosurgery | Admitting: Neurosurgery

## 2017-10-13 ENCOUNTER — Other Ambulatory Visit (HOSPITAL_COMMUNITY): Payer: Self-pay | Admitting: Neurosurgery

## 2017-10-13 DIAGNOSIS — M8588 Other specified disorders of bone density and structure, other site: Secondary | ICD-10-CM | POA: Diagnosis not present

## 2017-10-13 DIAGNOSIS — Z96641 Presence of right artificial hip joint: Secondary | ICD-10-CM | POA: Diagnosis not present

## 2017-10-13 DIAGNOSIS — Z96643 Presence of artificial hip joint, bilateral: Secondary | ICD-10-CM | POA: Insufficient documentation

## 2017-10-13 DIAGNOSIS — Z981 Arthrodesis status: Secondary | ICD-10-CM | POA: Insufficient documentation

## 2017-10-13 DIAGNOSIS — M4716 Other spondylosis with myelopathy, lumbar region: Secondary | ICD-10-CM

## 2017-10-13 DIAGNOSIS — I700 Atherosclerosis of aorta: Secondary | ICD-10-CM | POA: Insufficient documentation

## 2017-10-13 DIAGNOSIS — M4327 Fusion of spine, lumbosacral region: Secondary | ICD-10-CM | POA: Diagnosis not present

## 2017-10-13 DIAGNOSIS — M549 Dorsalgia, unspecified: Secondary | ICD-10-CM | POA: Diagnosis not present

## 2017-10-13 DIAGNOSIS — Z471 Aftercare following joint replacement surgery: Secondary | ICD-10-CM | POA: Diagnosis not present

## 2017-10-23 DIAGNOSIS — E039 Hypothyroidism, unspecified: Secondary | ICD-10-CM | POA: Diagnosis not present

## 2017-10-23 DIAGNOSIS — E782 Mixed hyperlipidemia: Secondary | ICD-10-CM | POA: Diagnosis not present

## 2017-10-23 DIAGNOSIS — E1165 Type 2 diabetes mellitus with hyperglycemia: Secondary | ICD-10-CM | POA: Diagnosis not present

## 2017-10-28 DIAGNOSIS — Z6822 Body mass index (BMI) 22.0-22.9, adult: Secondary | ICD-10-CM | POA: Diagnosis not present

## 2017-10-28 DIAGNOSIS — M4716 Other spondylosis with myelopathy, lumbar region: Secondary | ICD-10-CM | POA: Diagnosis not present

## 2017-10-30 DIAGNOSIS — E119 Type 2 diabetes mellitus without complications: Secondary | ICD-10-CM | POA: Diagnosis not present

## 2017-10-30 DIAGNOSIS — E039 Hypothyroidism, unspecified: Secondary | ICD-10-CM | POA: Diagnosis not present

## 2017-10-30 DIAGNOSIS — E782 Mixed hyperlipidemia: Secondary | ICD-10-CM | POA: Diagnosis not present

## 2017-10-30 DIAGNOSIS — I10 Essential (primary) hypertension: Secondary | ICD-10-CM | POA: Diagnosis not present

## 2017-10-30 DIAGNOSIS — E1165 Type 2 diabetes mellitus with hyperglycemia: Secondary | ICD-10-CM | POA: Diagnosis not present

## 2017-12-10 ENCOUNTER — Encounter: Payer: Self-pay | Admitting: Internal Medicine

## 2017-12-10 ENCOUNTER — Ambulatory Visit (INDEPENDENT_AMBULATORY_CARE_PROVIDER_SITE_OTHER): Payer: Medicare Other | Admitting: Internal Medicine

## 2017-12-10 DIAGNOSIS — T8789 Other complications of amputation stump: Secondary | ICD-10-CM

## 2017-12-10 DIAGNOSIS — L89892 Pressure ulcer of other site, stage 2: Secondary | ICD-10-CM | POA: Diagnosis not present

## 2017-12-10 DIAGNOSIS — T879 Unspecified complications of amputation stump: Secondary | ICD-10-CM | POA: Diagnosis not present

## 2017-12-10 NOTE — Assessment & Plan Note (Signed)
He has an open wound and I suspect irritation from prosthesis.  He does not want to go back to his orthopedist in [place]  I will refer him to Dr. Lajoyce Corners for the wound and suggestions to off load or fix the prosthesis.

## 2017-12-10 NOTE — Assessment & Plan Note (Signed)
Irritation of prothesis as above.

## 2017-12-10 NOTE — Progress Notes (Signed)
   Subjective:    Patient ID: Johnny Patrick, male    DOB: 20-May-1945, 72 y.o.   MRN: [id number]  HPI Here for a work in visit for a sore on his left stump at the site of his previous amputation. He was last seen here in 2018 by Dr. Daiva Eves for follow up for stump irritation due to poor fit from his prosthesis.  No active infection at that time.  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 Daiva Eves and treated with vancomycin IV by myself and then with persistent swelling, he underwent BKA by an orthopedist at [facility].  He then came in on June 2018 with above complaint of stump irritation and is here today for the same with concern for infection. He is here with his wife and daughter.  His prosthesis has been irritating his stump and has an open wound.  Some serosanguinous drainage.     Review of Systems  Constitutional: Negative for fatigue.       Objective:   Physical Exam  Musculoskeletal:  Stump with open ulcer about stage 2, no surrounding erythema, no drainage, no tenderness, no warmth.            Assessment & Plan:

## 2017-12-30 DIAGNOSIS — Z6821 Body mass index (BMI) 21.0-21.9, adult: Secondary | ICD-10-CM | POA: Diagnosis not present

## 2017-12-30 DIAGNOSIS — M4716 Other spondylosis with myelopathy, lumbar region: Secondary | ICD-10-CM | POA: Diagnosis not present

## 2018-02-24 DIAGNOSIS — E039 Hypothyroidism, unspecified: Secondary | ICD-10-CM | POA: Diagnosis not present

## 2018-02-24 DIAGNOSIS — Z23 Encounter for immunization: Secondary | ICD-10-CM | POA: Diagnosis not present

## 2018-02-24 DIAGNOSIS — E1165 Type 2 diabetes mellitus with hyperglycemia: Secondary | ICD-10-CM | POA: Diagnosis not present

## 2018-02-24 DIAGNOSIS — I10 Essential (primary) hypertension: Secondary | ICD-10-CM | POA: Diagnosis not present

## 2018-02-24 DIAGNOSIS — E782 Mixed hyperlipidemia: Secondary | ICD-10-CM | POA: Diagnosis not present

## 2018-02-24 DIAGNOSIS — Z0000 Encounter for general adult medical examination without abnormal findings: Secondary | ICD-10-CM | POA: Diagnosis not present

## 2018-02-24 DIAGNOSIS — E119 Type 2 diabetes mellitus without complications: Secondary | ICD-10-CM | POA: Diagnosis not present

## 2018-03-03 DIAGNOSIS — E039 Hypothyroidism, unspecified: Secondary | ICD-10-CM | POA: Diagnosis not present

## 2018-03-03 DIAGNOSIS — L4059 Other psoriatic arthropathy: Secondary | ICD-10-CM | POA: Diagnosis not present

## 2018-03-03 DIAGNOSIS — M353 Polymyalgia rheumatica: Secondary | ICD-10-CM | POA: Diagnosis not present

## 2018-03-03 DIAGNOSIS — G4733 Obstructive sleep apnea (adult) (pediatric): Secondary | ICD-10-CM | POA: Diagnosis not present

## 2018-03-03 DIAGNOSIS — J449 Chronic obstructive pulmonary disease, unspecified: Secondary | ICD-10-CM | POA: Diagnosis not present

## 2018-03-03 DIAGNOSIS — E1169 Type 2 diabetes mellitus with other specified complication: Secondary | ICD-10-CM | POA: Diagnosis not present

## 2018-03-03 DIAGNOSIS — M150 Primary generalized (osteo)arthritis: Secondary | ICD-10-CM | POA: Diagnosis not present

## 2018-03-03 DIAGNOSIS — E1165 Type 2 diabetes mellitus with hyperglycemia: Secondary | ICD-10-CM | POA: Diagnosis not present

## 2018-03-03 DIAGNOSIS — E782 Mixed hyperlipidemia: Secondary | ICD-10-CM | POA: Diagnosis not present

## 2018-03-03 DIAGNOSIS — S88112A Complete traumatic amputation at level between knee and ankle, left lower leg, initial encounter: Secondary | ICD-10-CM | POA: Diagnosis not present

## 2018-03-17 DIAGNOSIS — M1990 Unspecified osteoarthritis, unspecified site: Secondary | ICD-10-CM | POA: Diagnosis not present

## 2018-03-17 DIAGNOSIS — M25561 Pain in right knee: Secondary | ICD-10-CM | POA: Diagnosis not present

## 2018-03-17 DIAGNOSIS — M1711 Unilateral primary osteoarthritis, right knee: Secondary | ICD-10-CM | POA: Diagnosis not present

## 2018-03-17 DIAGNOSIS — M25559 Pain in unspecified hip: Secondary | ICD-10-CM | POA: Diagnosis not present

## 2018-03-17 DIAGNOSIS — Z79899 Other long term (current) drug therapy: Secondary | ICD-10-CM | POA: Diagnosis not present

## 2018-03-17 DIAGNOSIS — L4050 Arthropathic psoriasis, unspecified: Secondary | ICD-10-CM | POA: Diagnosis not present

## 2018-03-17 DIAGNOSIS — M549 Dorsalgia, unspecified: Secondary | ICD-10-CM | POA: Diagnosis not present

## 2018-04-14 DIAGNOSIS — M47816 Spondylosis without myelopathy or radiculopathy, lumbar region: Secondary | ICD-10-CM | POA: Diagnosis not present

## 2018-04-14 DIAGNOSIS — M4716 Other spondylosis with myelopathy, lumbar region: Secondary | ICD-10-CM | POA: Diagnosis not present

## 2018-04-14 DIAGNOSIS — Z6821 Body mass index (BMI) 21.0-21.9, adult: Secondary | ICD-10-CM | POA: Diagnosis not present

## 2018-04-21 DIAGNOSIS — R404 Transient alteration of awareness: Secondary | ICD-10-CM | POA: Diagnosis not present

## 2018-04-21 DIAGNOSIS — R000 Tachycardia, unspecified: Secondary | ICD-10-CM | POA: Diagnosis not present

## 2018-04-21 DIAGNOSIS — R58 Hemorrhage, not elsewhere classified: Secondary | ICD-10-CM | POA: Diagnosis not present

## 2018-04-21 DIAGNOSIS — R0689 Other abnormalities of breathing: Secondary | ICD-10-CM | POA: Diagnosis not present

## 2018-04-21 DIAGNOSIS — I499 Cardiac arrhythmia, unspecified: Secondary | ICD-10-CM | POA: Diagnosis not present

## 2018-04-22 ENCOUNTER — Other Ambulatory Visit: Payer: Self-pay

## 2018-04-22 ENCOUNTER — Encounter (HOSPITAL_COMMUNITY): Payer: Self-pay | Admitting: *Deleted

## 2018-04-22 ENCOUNTER — Emergency Department (HOSPITAL_COMMUNITY)
Admission: EM | Admit: 2018-04-22 | Discharge: 2018-05-09 | Disposition: E | Payer: Medicare Other | Attending: Emergency Medicine | Admitting: Emergency Medicine

## 2018-04-22 DIAGNOSIS — Z96643 Presence of artificial hip joint, bilateral: Secondary | ICD-10-CM | POA: Insufficient documentation

## 2018-04-22 DIAGNOSIS — Z96642 Presence of left artificial hip joint: Secondary | ICD-10-CM | POA: Diagnosis not present

## 2018-04-22 DIAGNOSIS — Z87891 Personal history of nicotine dependence: Secondary | ICD-10-CM | POA: Diagnosis not present

## 2018-04-22 DIAGNOSIS — J449 Chronic obstructive pulmonary disease, unspecified: Secondary | ICD-10-CM | POA: Insufficient documentation

## 2018-04-22 DIAGNOSIS — R55 Syncope and collapse: Secondary | ICD-10-CM | POA: Diagnosis present

## 2018-04-22 DIAGNOSIS — Z7984 Long term (current) use of oral hypoglycemic drugs: Secondary | ICD-10-CM | POA: Insufficient documentation

## 2018-04-22 DIAGNOSIS — E039 Hypothyroidism, unspecified: Secondary | ICD-10-CM | POA: Insufficient documentation

## 2018-04-22 DIAGNOSIS — E119 Type 2 diabetes mellitus without complications: Secondary | ICD-10-CM | POA: Insufficient documentation

## 2018-04-22 DIAGNOSIS — I469 Cardiac arrest, cause unspecified: Secondary | ICD-10-CM

## 2018-04-22 DIAGNOSIS — Z79899 Other long term (current) drug therapy: Secondary | ICD-10-CM | POA: Insufficient documentation

## 2018-04-22 MED ORDER — SODIUM BICARBONATE 8.4 % IV SOLN
50.0000 meq | Freq: Once | INTRAVENOUS | Status: AC
  Administered 2018-04-22: 50 meq via INTRAVENOUS

## 2018-04-22 MED ORDER — EPINEPHRINE PF 1 MG/ML IJ SOLN
1.0000 mg | Freq: Once | INTRAMUSCULAR | Status: AC
  Administered 2018-04-22: 1 mg via INTRAVENOUS

## 2018-04-22 MED FILL — Medication: Qty: 1 | Status: AC

## 2018-05-09 NOTE — ED Provider Notes (Signed)
[facility] EMERGENCY DEPARTMENT Provider Note   CSN: [id number] Arrival date & time: 04-30-18  0024     History   Chief Complaint No chief complaint on file.   HPI Johnny Patrick is a 73 y.o. male.  Level 5 caveat for CPR in progress.  Patient brought in by EMS after collapsing at home.  EMS reports he had weak pulses on their initial contact but quickly progressed to asystole.  He was given epinephrine and CPR was started at 1213.  They were unable to pass the [facility] airway due to blood in his oropharynx. Patient apparently collapsed at home but did not fall or hit his head. Wife reports he has a history of COPD and has had a cough and congestion for the past several days but started to feel better today.  He walked to the bathroom and was apparently suddenly coughing up large amounts of blood with clots.  This has never happened before.  He had thready pulses on EMS arrival and quickly progressed to asystole.  The history is provided by the EMS personnel and a relative. The history is limited by the condition of the patient.    Past Medical History:  Diagnosis Date  . Anemia   . Anemia, chronic disease 10/14/2011  . BKA stump complication ([place]) 5/63/8756  . Chronic airway obstruction, not elsewhere classified   . Cryptococcus ([place]) 2008   left arm  . Diabetes ([place])   . Diabetic foot ulcer ([place]) 09/18/2016  . Esophageal reflux   . Hypothyroidism   . Low back pain   . MRSA (methicillin resistant Staphylococcus aureus)   . MSSA (methicillin susceptible Staphylococcus aureus) septicemia ([place])    L 2nd toe-amputated June 2011  . Nephrolithiasis   . Osteoarthritis   . Psoriatic arthritis ([place])   . Psoriatic arthritis ([place]) 10/14/2011  . Shingles    x2  . Sleep apnea    cpap  . Unspecified essential hypertension     Patient Active Problem List   Diagnosis Date Noted  . 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 Type II diabetes mellitus with  renal manifestations ([place]) 12/19/2016  . BKA stump complication ([place]) 43/32/9518  . Diabetic foot ulcer ([place]) 09/18/2016  . Anemia, chronic disease 10/14/2011  . Rheumatoid arthritis ([place]) 10/14/2011  . Psoriatic arthritis ([place]) 10/14/2011  . Insomnia, chronic 02/27/2011  . LUNG NODULE 07/18/2009  . HYPERLIPIDEMIA 06/30/2009  . DEGENERATIVE DISC DISEASE 06/30/2009  . HIP REPLACEMENT, LEFT, HX OF 06/30/2009  . OTHER POSTSURGICAL STATUS OTHER 06/30/2009  . Hypothyroidism 12/15/2006  . Obstructive sleep apnea 12/15/2006  . Essential hypertension 12/15/2006  . 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 GERD 12/15/2006  . NEPHROLITHIASIS, HX OF 12/15/2006  . Personal history of tobacco use, presenting hazards to health 12/15/2006    Past Surgical History:  Procedure Laterality Date  . BACK SURGERY     3 fusions, 4th - undo 3rd fusion  . THORACIC SPINE SURGERY    . TOE AMPUTATION    . TOE SURGERY  2009   little toe on right foot , bone removed  . TOTAL HIP ARTHROPLASTY     bilateral        Home Medications    Prior to Admission medications   Medication Sig Start Date End Date Taking? Authorizing Provider  Ascorbic Acid (VITAMIN C) 500 MG CHEW Chew 500 mg by mouth daily.      [provider]  blood glucose  meter kit and supplies Dispense based on patient and insurance preference. Use up to four times daily as directed. (FOR ICD-9 250.00, 250.01). 12/23/16   Regalado, Belkys A, MD  calcium carbonate (CALCIUM 600) 600 MG TABS Take 600 mg by mouth 3 (three) times daily.      [provider]  carvedilol (COREG) 25 MG tablet TAKE ONE TABLET TWICE DAILY 09/27/12   Wall, Marijo Conception, MD  cyclobenzaprine (FLEXERIL) 10 MG tablet Take 10 mg by mouth every 8 (eight) hours as needed. For muscle pain.    [provider]  fluticasone furoate-vilanterol (BREO ELLIPTA) 100-25 MCG/INH AEPB Inhale 1 puff into the lungs daily. Inhale 1 puff then rinse mouth, once daily 10/05/15    Baird Lyons D, MD  folic acid (FOLVITE) 1 MG tablet Take 1 mg by mouth daily.      [provider]  gabapentin (NEURONTIN) 300 MG capsule Take 1 capsule by mouth 3 (three) times daily. 12/05/16   [provider]  glipiZIDE (GLUCOTROL) 5 MG tablet Take 0.5 tablets (2.5 mg total) by mouth daily before breakfast. Patient taking differently: Take 5 mg by mouth daily before breakfast.  12/24/16   Regalado, Belkys A, MD  GLUCERNA (GLUCERNA) LIQD Take 237 mLs by mouth 2 (two) times daily between meals. Patient not taking: Reported on 01/29/2017 12/23/16   Regalado, Jerald Kief A, MD  HYDROmorphone (DILAUDID) 2 MG tablet Take 2 mg by mouth every 4 (four) hours as needed for severe pain.     [provider]  leflunomide (ARAVA) 10 MG tablet Take 30 mg by mouth daily.  09/21/14   [provider]  levothyroxine (SYNTHROID, LEVOTHROID) 150 MCG tablet Take 150 mcg by mouth daily.     [provider]  losartan (COZAAR) 50 MG tablet Take 100 mg by mouth.     [provider]  Multiple Vitamin (MULITIVITAMIN WITH MINERALS) TABS Take 1 tablet by mouth daily.    [provider]  Omega-3 Fatty Acids (FISH OIL) 1000 MG CAPS Take 1 capsule by mouth 3 (three) times daily.     [provider]  oxyCODONE-acetaminophen (PERCOCET) 10-325 MG tablet Take 1 to 2 tablets every 6 hours as needed for pain. 01/02/17   [provider]  predniSONE (DELTASONE) 5 MG tablet Take 5 mg by mouth daily.      [provider]  SYMBICORT 160-4.5 MCG/ACT inhaler USE TWO PUFFS TWICE DAILY Patient not taking: Reported on 01/29/2017 04/04/15   Baird Lyons D, MD  temazepam (RESTORIL) 15 MG capsule TAKE 1 OR 2 CAPSULES AT BEDTIME AS NEEDED FOR SLEEP. 03/26/16   Baird Lyons D, MD  triamcinolone cream (KENALOG) 0.1 % Apply 1 application topically 2 (two) times daily as needed (psorisis).     [provider]    Family History Family History  Problem  Relation Age of Onset  . Coronary artery disease Mother   . Pneumonia Mother   . Heart failure Mother   . Breast cancer Mother   . Psoriasis Father   . Coronary artery disease Brother   . Colon cancer Neg Hx   . Stomach cancer Neg Hx     Social History Social History   Tobacco Use  . Smoking status: Former Smoker    Types: Cigarettes    Last attempt to quit: 04/24/1991    Years since quitting: 27.0  . Smokeless tobacco: Never Used  Substance Use Topics  . Alcohol use: No  . Drug use: No  Allergies   Augmentin [amoxicillin-pot clavulanate] and Sulfur   Review of Systems Review of Systems  Unable to perform ROS: Acuity of condition     Physical Exam Updated Vital Signs There were no vitals taken for this visit.  Physical Exam Constitutional:      Appearance: He is ill-appearing and toxic-appearing.     Comments: Unresponsive  HENT:     Mouth/Throat:     Comments: Multiple missing teeth, likely chronic Copious blood in oropharynx that was suctioned. Eyes:     Comments: Fixed and dilated  Cardiovascular:     Comments: Pulses present with chest compressions only Pulmonary:     Comments: No breath sounds on right, diminished breath sounds with bagging on the left  Crepitance across chest wall and likely subcutaneous air Fractured ribs felt/  Very difficult to bag Abdominal:     General: There is distension.  Musculoskeletal:     Comments: Left BKA  Neurological:     Comments: Unresponsive      ED Treatments / Results  Labs (all labs ordered are listed, but only abnormal results are displayed) Labs Reviewed - No data to display  EKG None  Radiology No results found.  Procedures Procedure Name: Intubation Date/Time: 23-Apr-2018 1:41 AM Performed by: Ezequiel Essex, MD Pre-anesthesia Checklist: Patient identified, Emergency Drugs available, Suction available, Timeout performed and Patient being monitored Oxygen Delivery Method: Ambu  bag Preoxygenation: Pre-oxygenation with 100% oxygen Ventilation: Mask ventilation with difficulty and Two handed mask ventilation required Laryngoscope Size: Mac, Glidescope and 4 Grade View: Grade II Tube size: 7.5 mm Number of attempts: 1 Airway Equipment and Method: Video-laryngoscopy and Rigid stylet Placement Confirmation: ETT inserted through vocal cords under direct vision and Positive ETCO2 Secured at: 24 cm Tube secured with: ETT holder Dental Injury: Teeth and Oropharynx as per pre-operative assessment  Difficulty Due To: Difficulty was anticipated, Difficult Airway-  due to edematous airway, Difficult Airway- due to limited oral opening and Difficult Airway-due to vocal cord/laryngeal edema Comments: Copious Bloody secretions in oropharynx.  Patient intubated without any medications.  Tube was passed through vocal cords with direct visualization.  Color change was noted.  Still very difficult to bag with decreased breath sounds on the left even after needle decompression.    Needle decompression Date/Time: 2018-04-23 12:30 AM Performed by: Ezequiel Essex, MD Authorized by: Ezequiel Essex, MD  Consent: The procedure was performed in an emergent situation. Verbal consent not obtained. Written consent not obtained. Patient identity confirmed: anonymous protocol, patient vented/unresponsive Local anesthesia used: no  Anesthesia: Local anesthesia used: no  Sedation: Patient sedated: no     (including critical care time)  Medications Ordered in ED Medications - No data to display   Initial Impression / Assessment and Plan / ED Course  I have reviewed the triage vital signs and the nursing notes.  Pertinent labs & imaging results that were available during my care of the patient were reviewed by me and considered in my medical decision making (see chart for details).    Patient arrives unresponsive with CPR in progress.  Asystole on the monitor.  Chest  compressions continued on arrival.  Patient without breath sounds on the right as well as subcutaneous air.  Chest was emergently needle decompressed bilaterally with concern for possible tension pneumothorax.   Patient very difficult to bag with much resistance felt.  There is no improvement in ability to bag with needle decompression. Fractured ribs palpated and crepitance and Subcutaneous air of chest and  neck.  Patient intubated with large amount of blood suctioned from oropharynx. Tube confirmed with direct visualization.  CPR continued for 20 minutes on arrival multiple rounds of ACLS meds given including epinephrine, bicarb, and calcium.  Rhythm remained asystole.  No cardiac activity on monitor. No cardiac activity on bedside ultrasound.  Patient was pronounced dead at 25-Jun-1236 after discussion with family. PCP Dr. Ashby Dawes will be notified. D/w Dr. Shelia Media covering for Dr. Ashby Dawes who accepts death certificate to office.  Cardiopulmonary Resuscitation (CPR) Procedure Note Directed/Performed by: Ezequiel Essex I personally directed ancillary staff and/or performed CPR in an effort to regain return of spontaneous circulation and to maintain cardiac, neuro and systemic perfusion.   CRITICAL CARE Performed by: Ezequiel Essex Total critical care time: 60 minutes Critical care time was exclusive of separately billable procedures and treating other patients. Critical care was necessary to treat or prevent imminent or life-threatening deterioration. Critical care was time spent personally by me on the following activities: development of treatment plan with patient and/or surrogate as well as nursing, discussions with consultants, evaluation of patient's response to treatment, examination of patient, obtaining history from patient or surrogate, ordering and performing treatments and interventions, ordering and review of laboratory studies, ordering and review of radiographic studies,  pulse oximetry and re-evaluation of patient's condition.   Final Clinical Impressions(s) / ED Diagnoses   Final diagnoses:  Cardiac arrest [facility])    ED Discharge Orders    None       Ezequiel Essex, MD 05/15/2018 [phone]

## 2018-05-09 NOTE — ED Notes (Signed)
Pt intubated with 7.5cm ETT tube by Dr. Manus Gunning, using glidescope; positive color change but difficulty pushing air; Dr. Manus Gunning attempted needle decompression at 0028 with no success; pt's abdomen and neck enlarging with each chest compression; pt's face and neck turning purple in color; pt's time of death called at 0038 by Margarito Liner, RN, Neldon Mc RN and Georgann Housekeeper RN

## 2018-05-09 NOTE — ED Notes (Addendum)
Called and spoke with Dutch Quint from [facility] at [phone] and gave information on patient; she called back at [phone] and stated pt is not a candidate for any donor services; spoke with pt's daughter and she requested that pt's shirt be removed and placed in bag and sent with him to the funeral home; pt's [facility] Ref number is [phone]

## 2018-05-09 NOTE — ED Triage Notes (Signed)
Pt brought in by [facility] for CPR in progress; ems reports pt had got up to go to the bathroom and pt was found lying in the floor by spouse, unresponsive; ems reports pt vomited bright red blood with clots in trash can; ems reports pt had central and peripheral pulses on scene and when pt was placed in the truck and hooked up to the monitor pt was found to be in asystole; CPR was started by ems and attempted to intubate but was unable; ems administered one vial of epi at 0016

## 2018-05-09 DEATH — deceased

## 2019-05-29 IMAGING — CT CT HEAD W/O CM
3 of 4 series · 14 of 47 positions shown, 16 images · non-contrast
Comparison: 10/26/2007

CLINICAL DATA: Altered level consciousness. History of
hypothyroidism, hypertension.

EXAM:
CT HEAD WITHOUT CONTRAST
TECHNIQUE: Contiguous axial images were obtained from the base of the skull
through the vertex without intravenous contrast.

[Series 2: head w/o · axial · non-contrast · 0.46mm/px · z∈[+1823,+1948]mm · 8 of 33 slices shown, 10 images]
[im 4/33  brain]
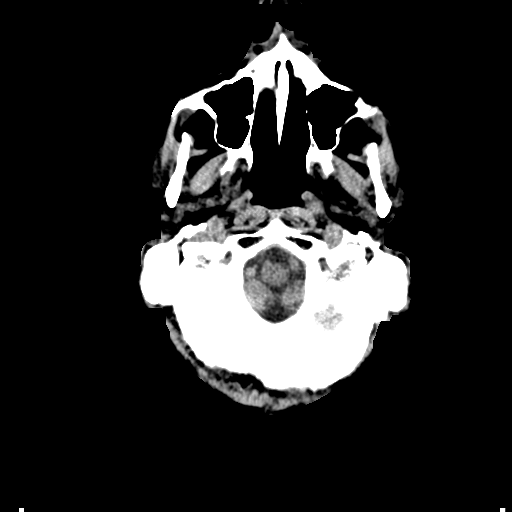
[im 4/33  bone]
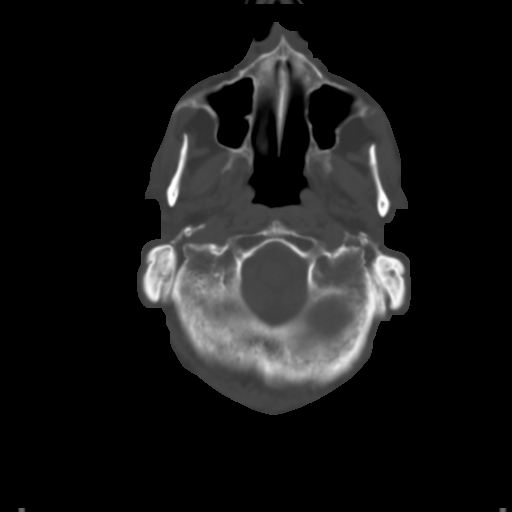
[im 7/33  brain]
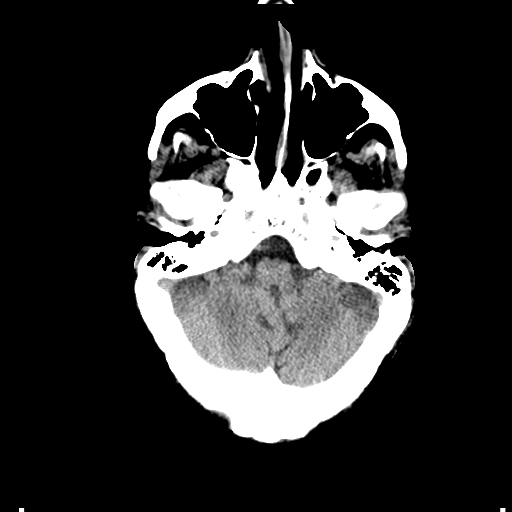
[im 10/33  brain]
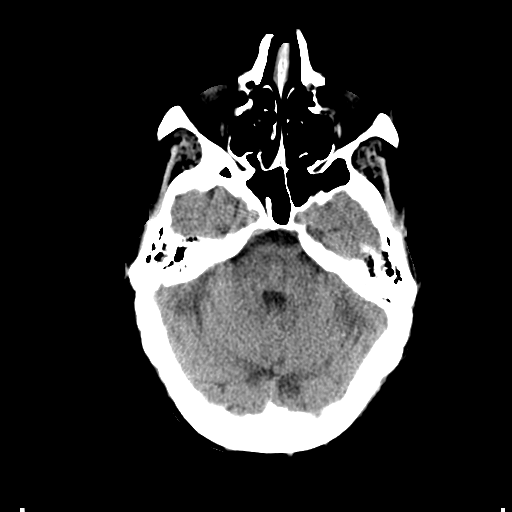
[im 13/33  brain]
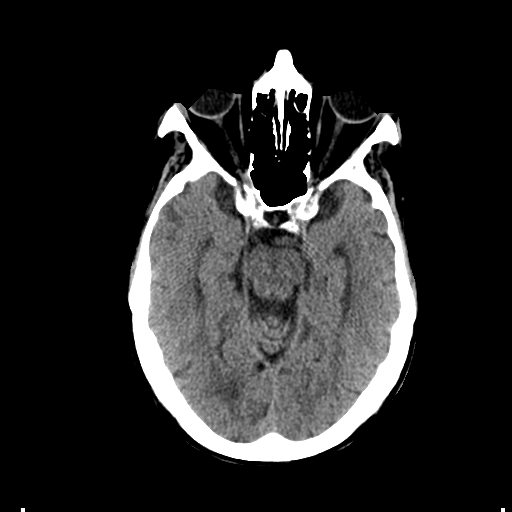
[im 20/33  brain]
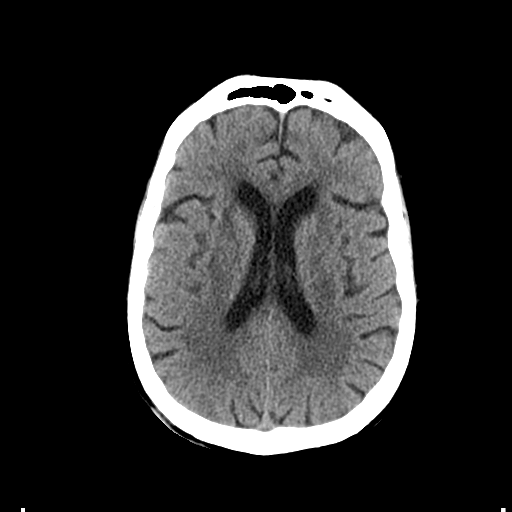
[im 20/33  bone]
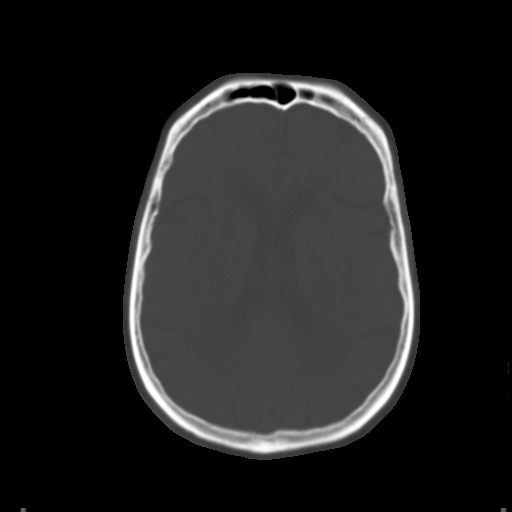
[im 23/33  brain]
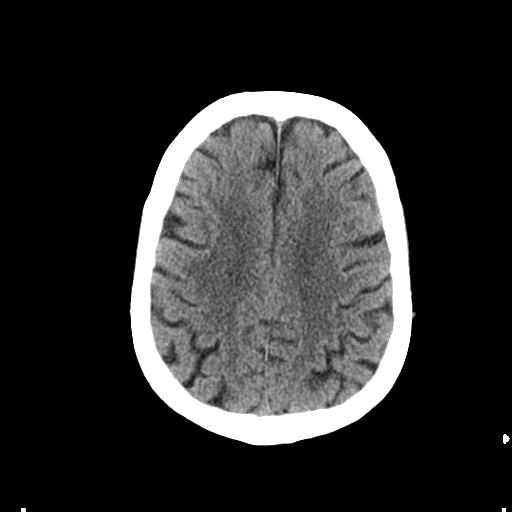
[im 26/33  brain]
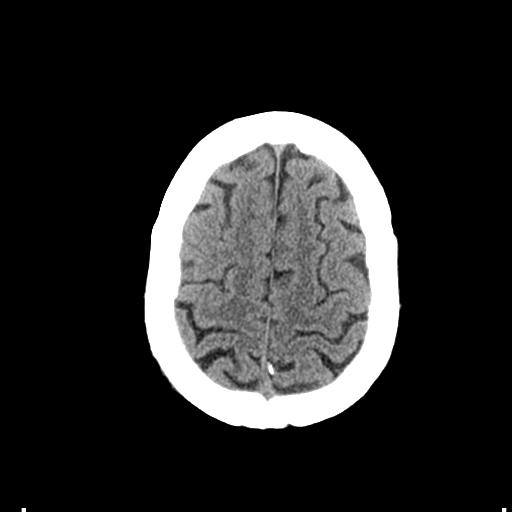
[im 29/33  brain]
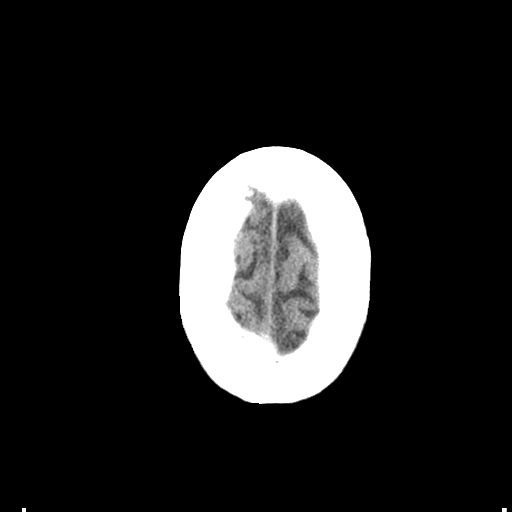

[Series 4: coronal · coronal · 0.32mm/px · 3 of 79 slices shown]
[im 27/79  brain]
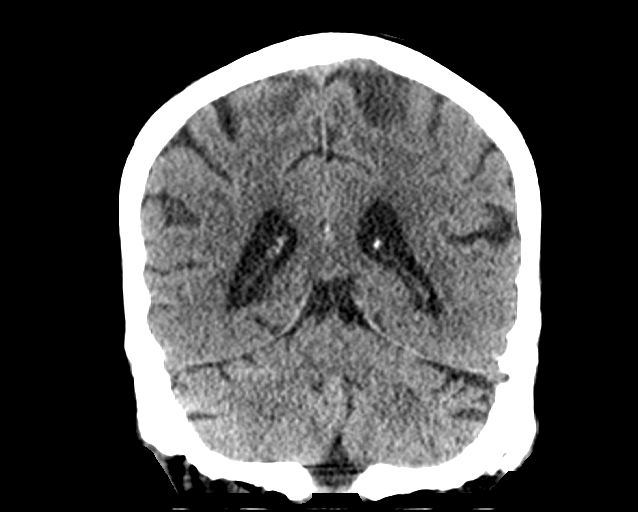
[im 35/79  brain]
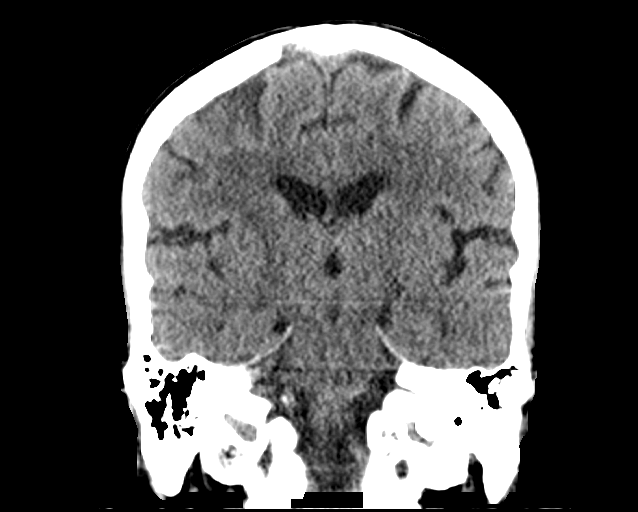
[im 44/79  brain]
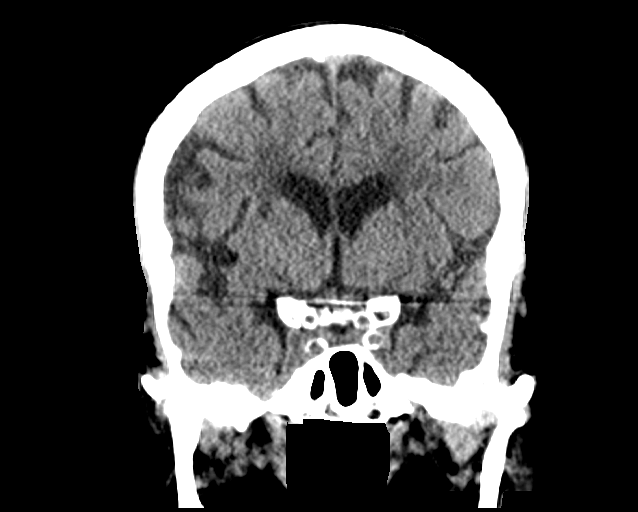

[Series 5: sagittal · sagittal · 0.35mm/px · 3 of 69 slices shown]
[im 23/69  brain]
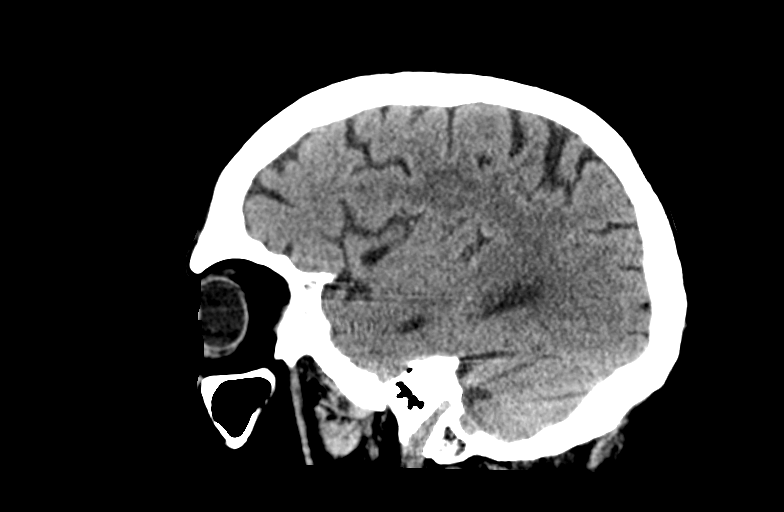
[im 35/69  brain]
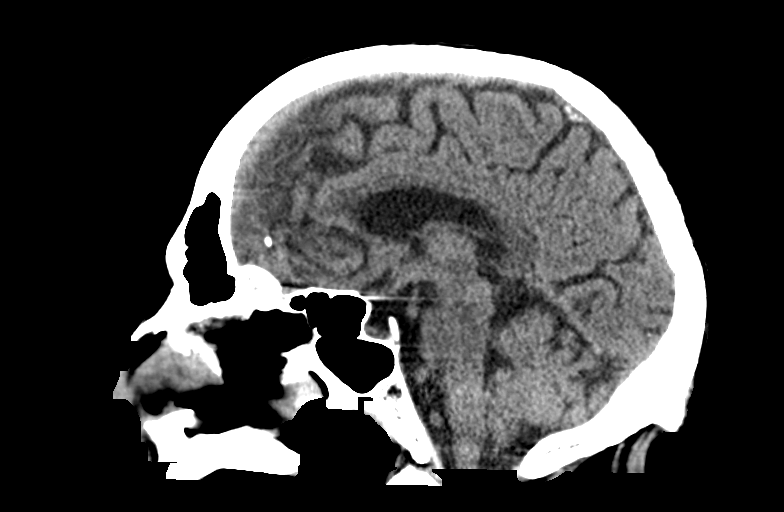
[im 46/69  brain]
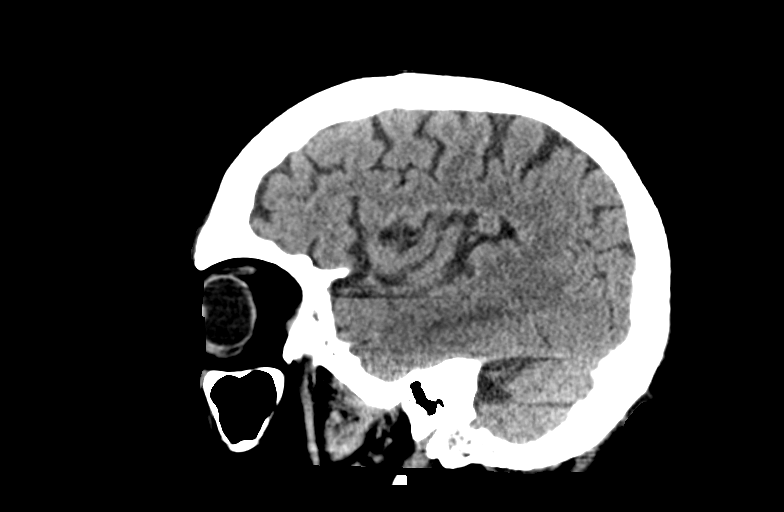

[14 of 47 positions shown; findings below may reference images not displayed]

FINDINGS: Brain: No evidence of acute infarction, hemorrhage, extra-axial
collection, ventriculomegaly, or mass effect. Generalized cerebral
atrophy. Periventricular white matter low attenuation likely
secondary to microangiopathy.

Vascular: Cerebrovascular atherosclerotic calcifications are noted.

Skull: Negative for fracture or focal lesion.

Sinuses/Orbits: Visualized portions of the orbits are unremarkable.
Visualized portions of the paranasal sinuses and mastoid air cells
are unremarkable.

Other: None.
IMPRESSION: 1. No acute intracranial pathology.
2. Chronic microvascular disease and cerebral atrophy.

## 2019-06-07 IMAGING — DX DG CHEST 2V
2 series · 2 of 2 positions shown · non-contrast
Comparison: December 19, 2016

CLINICAL DATA: Cough and congestion

EXAM:
CHEST  2 VIEW

[chest pa]
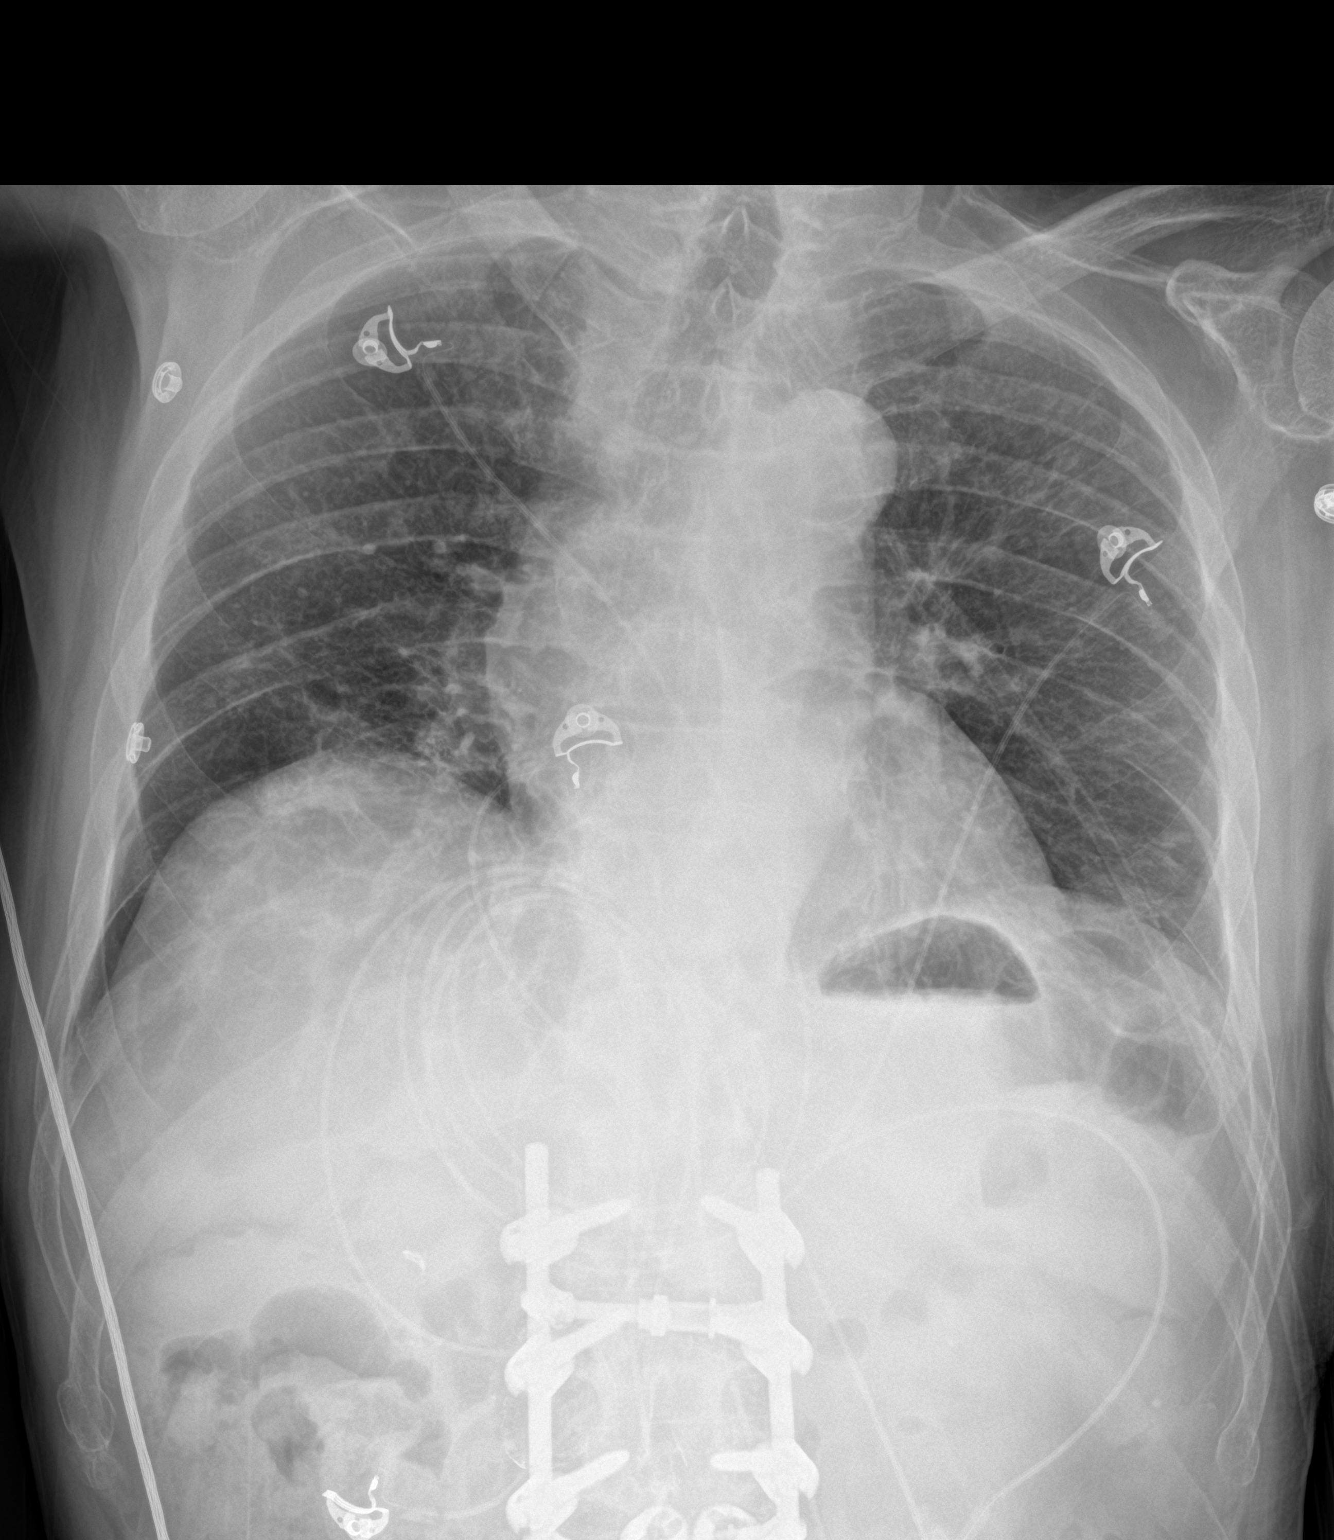

[chest lat]
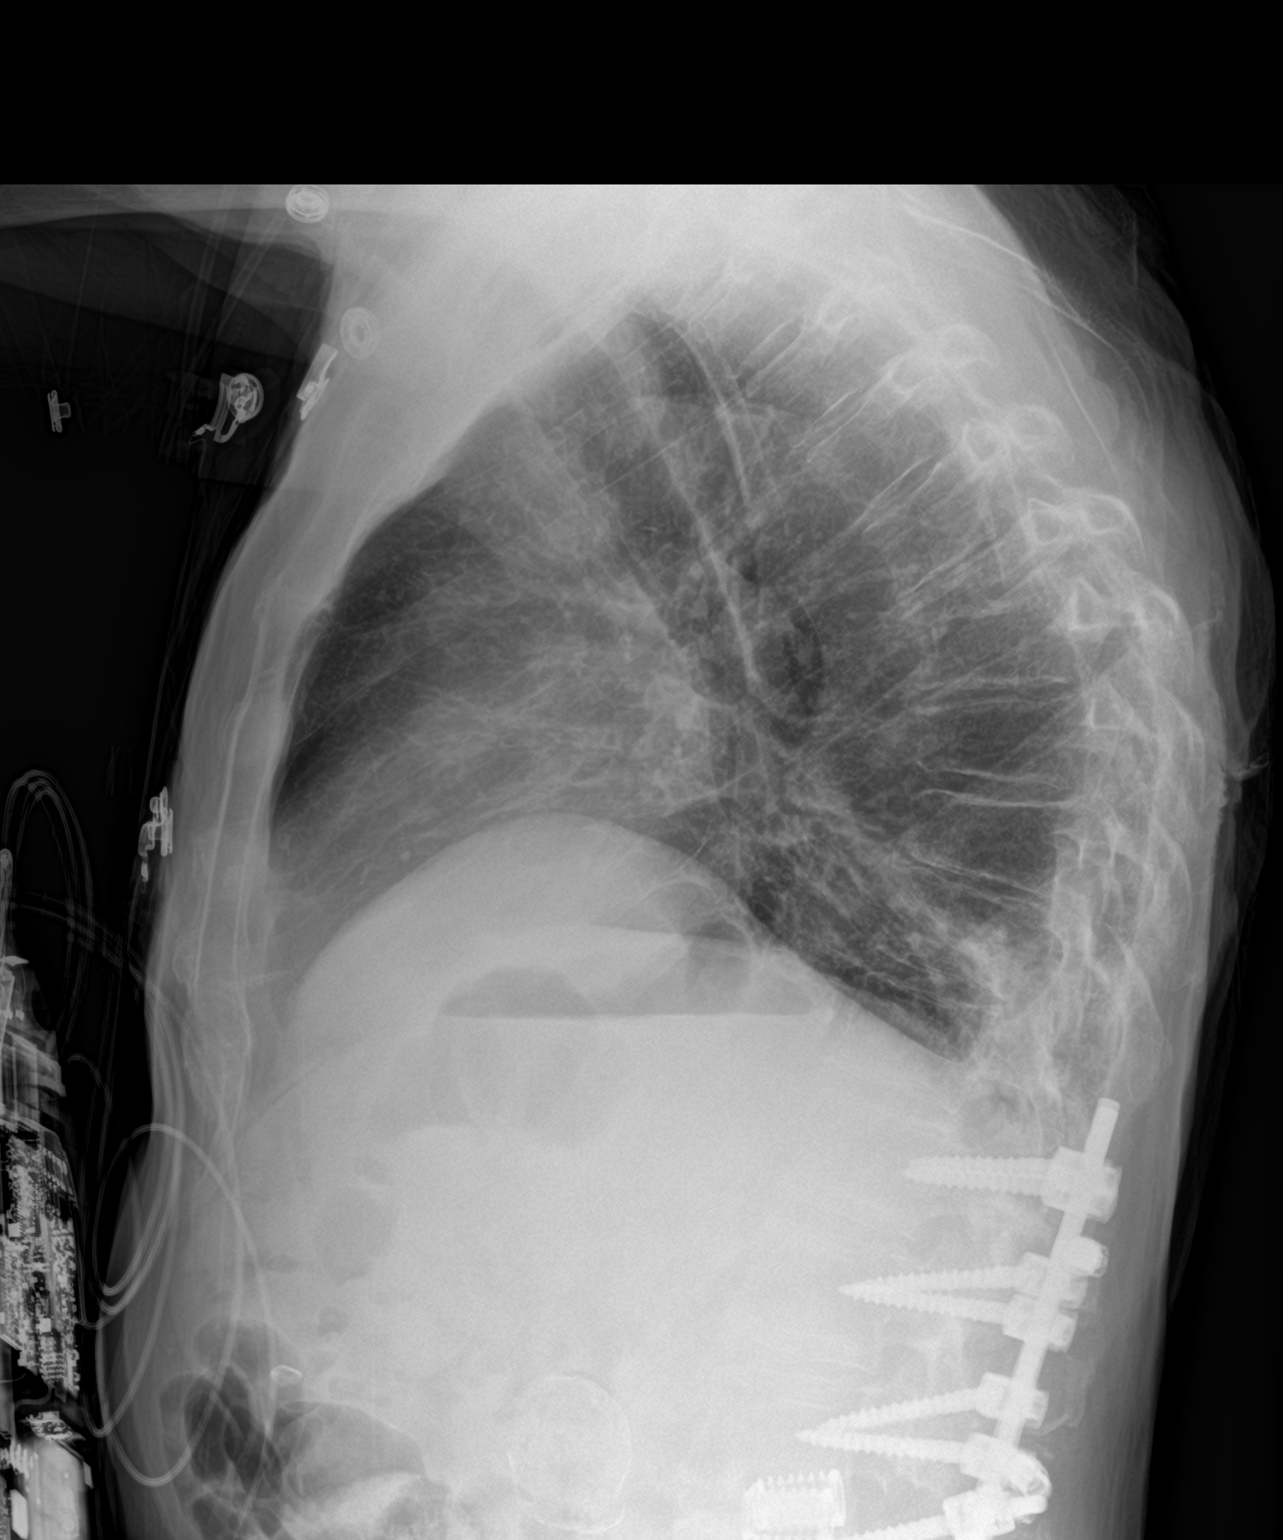

[2 of 2 positions shown; findings below may reference images not displayed]

FINDINGS: The cardiomediastinal silhouette is stable. Platelike opacity in the
bases on the lateral view favored represent atelectasis rather than
infiltrate. Suggested mild pulmonary venous congestion/edema. No
other interval changes or acute abnormalities.
IMPRESSION: 1. Opacity in the bases on the lateral view is favored to be
atelectasis. Recommend attention on follow-up.
2. Cardiomegaly and mild pulmonary venous congestion/edema.
